# Patient Record
Sex: Female | Born: 1955 | Race: White | Hispanic: No | Marital: Single | Smoking: Former smoker
Health system: Southern US, Community
[De-identification: ages and names within clinical notes are randomized; demographics above are authoritative.]

## PROBLEM LIST (undated history)

## (undated) DIAGNOSIS — F329 Major depressive disorder, single episode, unspecified: Secondary | ICD-10-CM

## (undated) DIAGNOSIS — T7840XA Allergy, unspecified, initial encounter: Secondary | ICD-10-CM

## (undated) DIAGNOSIS — R3129 Other microscopic hematuria: Secondary | ICD-10-CM

## (undated) DIAGNOSIS — N951 Menopausal and female climacteric states: Secondary | ICD-10-CM

## (undated) DIAGNOSIS — Z8669 Personal history of other diseases of the nervous system and sense organs: Secondary | ICD-10-CM

## (undated) DIAGNOSIS — K5732 Diverticulitis of large intestine without perforation or abscess without bleeding: Secondary | ICD-10-CM

## (undated) DIAGNOSIS — M1990 Unspecified osteoarthritis, unspecified site: Secondary | ICD-10-CM

## (undated) DIAGNOSIS — G43909 Migraine, unspecified, not intractable, without status migrainosus: Secondary | ICD-10-CM

## (undated) DIAGNOSIS — I10 Essential (primary) hypertension: Secondary | ICD-10-CM

## (undated) DIAGNOSIS — R109 Unspecified abdominal pain: Secondary | ICD-10-CM

## (undated) DIAGNOSIS — R112 Nausea with vomiting, unspecified: Secondary | ICD-10-CM

## (undated) DIAGNOSIS — N63 Unspecified lump in unspecified breast: Secondary | ICD-10-CM

## (undated) DIAGNOSIS — F411 Generalized anxiety disorder: Secondary | ICD-10-CM

## (undated) DIAGNOSIS — N760 Acute vaginitis: Secondary | ICD-10-CM

## (undated) DIAGNOSIS — Z9889 Other specified postprocedural states: Secondary | ICD-10-CM

## (undated) DIAGNOSIS — E785 Hyperlipidemia, unspecified: Secondary | ICD-10-CM

## (undated) DIAGNOSIS — N329 Bladder disorder, unspecified: Secondary | ICD-10-CM

## (undated) HISTORY — DX: Other microscopic hematuria: R31.29

## (undated) HISTORY — PX: OOPHORECTOMY: SHX86

## (undated) HISTORY — DX: Unspecified osteoarthritis, unspecified site: M19.90

## (undated) HISTORY — PX: COLONOSCOPY: SHX174

## (undated) HISTORY — DX: Generalized anxiety disorder: F41.1

## (undated) HISTORY — DX: Migraine, unspecified, not intractable, without status migrainosus: G43.909

## (undated) HISTORY — DX: Acute vaginitis: N76.0

## (undated) HISTORY — PX: ABDOMINAL HYSTERECTOMY: SHX81

## (undated) HISTORY — PX: TONSILLECTOMY: SUR1361

## (undated) HISTORY — PX: EXPLORATORY LAPAROTOMY: SUR591

## (undated) HISTORY — DX: Essential (primary) hypertension: I10

## (undated) HISTORY — DX: Hyperlipidemia, unspecified: E78.5

## (undated) HISTORY — PX: POLYPECTOMY: SHX149

## (undated) HISTORY — PX: DILATION AND CURETTAGE OF UTERUS: SHX78

## (undated) HISTORY — DX: Unspecified lump in unspecified breast: N63.0

## (undated) HISTORY — DX: Unspecified abdominal pain: R10.9

## (undated) HISTORY — DX: Diverticulitis of large intestine without perforation or abscess without bleeding: K57.32

## (undated) HISTORY — DX: Major depressive disorder, single episode, unspecified: F32.9

## (undated) HISTORY — DX: Menopausal and female climacteric states: N95.1

## (undated) HISTORY — DX: Allergy, unspecified, initial encounter: T78.40XA

---

## 1988-10-01 HISTORY — PX: ABDOMINAL HYSTERECTOMY: SHX81

## 1988-10-01 HISTORY — PX: OOPHORECTOMY: SHX86

## 2004-11-14 ENCOUNTER — Ambulatory Visit: Payer: Self-pay | Admitting: Internal Medicine

## 2004-11-22 ENCOUNTER — Ambulatory Visit: Payer: Self-pay | Admitting: Internal Medicine

## 2005-11-22 ENCOUNTER — Ambulatory Visit: Payer: Self-pay | Admitting: Internal Medicine

## 2005-11-26 ENCOUNTER — Ambulatory Visit: Payer: Self-pay | Admitting: Internal Medicine

## 2005-12-03 ENCOUNTER — Ambulatory Visit: Payer: Self-pay | Admitting: Internal Medicine

## 2006-01-08 ENCOUNTER — Ambulatory Visit: Payer: Self-pay | Admitting: Internal Medicine

## 2006-12-19 ENCOUNTER — Ambulatory Visit: Payer: Self-pay | Admitting: Internal Medicine

## 2006-12-19 LAB — CONVERTED CEMR LAB
ALT: 32 units/L (ref 0–40)
AST: 30 units/L (ref 0–37)
Albumin: 3.8 g/dL (ref 3.5–5.2)
Alkaline Phosphatase: 49 units/L (ref 39–117)
BUN: 11 mg/dL (ref 6–23)
Basophils Absolute: 0 10*3/uL (ref 0.0–0.1)
Basophils Relative: 0.6 % (ref 0.0–1.0)
Bilirubin Urine: NEGATIVE
Bilirubin, Direct: 0.1 mg/dL (ref 0.0–0.3)
CO2: 29 meq/L (ref 19–32)
Calcium: 9 mg/dL (ref 8.4–10.5)
Chloride: 104 meq/L (ref 96–112)
Cholesterol: 169 mg/dL (ref 0–200)
Creatinine, Ser: 0.6 mg/dL (ref 0.4–1.2)
Crystals: NEGATIVE
Eosinophils Absolute: 0.3 10*3/uL (ref 0.0–0.6)
Eosinophils Relative: 5.1 % — ABNORMAL HIGH (ref 0.0–5.0)
GFR calc Af Amer: 136 mL/min
GFR calc non Af Amer: 112 mL/min
Glucose, Bld: 110 mg/dL — ABNORMAL HIGH (ref 70–99)
HCT: 34.6 % — ABNORMAL LOW (ref 36.0–46.0)
HDL: 61.4 mg/dL (ref >39.0)
Hemoglobin: 12.1 g/dL (ref 12.0–15.0)
Ketones, ur: NEGATIVE mg/dL
LDL Cholesterol: 84 mg/dL (ref 0–99)
Leukocytes, UA: NEGATIVE
Lymphocytes Relative: 33.5 % (ref 12.0–46.0)
MCHC: 35 g/dL (ref 30.0–36.0)
MCV: 97.5 fL (ref 78.0–100.0)
Monocytes Absolute: 0.6 10*3/uL (ref 0.2–0.7)
Monocytes Relative: 8.5 % (ref 3.0–11.0)
Mucus, UA: NEGATIVE
Neutro Abs: 3.5 10*3/uL (ref 1.4–7.7)
Neutrophils Relative %: 52.3 % (ref 43.0–77.0)
Nitrite: NEGATIVE
Platelets: 283 10*3/uL (ref 150–400)
Potassium: 4.2 meq/L (ref 3.5–5.1)
RBC: 3.55 M/uL — ABNORMAL LOW (ref 3.87–5.11)
RDW: 11.6 % (ref 11.5–14.6)
Sodium: 139 meq/L (ref 135–145)
Specific Gravity, Urine: 1.01 (ref 1.000–1.03)
TSH: 0.92 microintl units/mL (ref 0.35–5.50)
Total Bilirubin: 0.6 mg/dL (ref 0.3–1.2)
Total CHOL/HDL Ratio: 2.8
Total Protein, Urine: NEGATIVE mg/dL
Total Protein: 6.8 g/dL (ref 6.0–8.3)
Triglycerides: 116 mg/dL (ref 0–149)
Urine Glucose: NEGATIVE mg/dL
Urobilinogen, UA: 0.2 (ref 0.0–1.0)
VLDL: 23 mg/dL (ref 0–40)
WBC: 6.6 10*3/uL (ref 4.5–10.5)
pH: 7.5 (ref 5.0–8.0)

## 2006-12-23 ENCOUNTER — Ambulatory Visit: Payer: Self-pay | Admitting: Internal Medicine

## 2007-05-31 ENCOUNTER — Encounter: Payer: Self-pay | Admitting: Internal Medicine

## 2007-05-31 DIAGNOSIS — I10 Essential (primary) hypertension: Secondary | ICD-10-CM | POA: Insufficient documentation

## 2007-05-31 DIAGNOSIS — E785 Hyperlipidemia, unspecified: Secondary | ICD-10-CM

## 2007-05-31 DIAGNOSIS — G43909 Migraine, unspecified, not intractable, without status migrainosus: Secondary | ICD-10-CM | POA: Insufficient documentation

## 2007-05-31 HISTORY — DX: Essential (primary) hypertension: I10

## 2007-05-31 HISTORY — DX: Hyperlipidemia, unspecified: E78.5

## 2007-12-18 ENCOUNTER — Ambulatory Visit: Payer: Self-pay | Admitting: Internal Medicine

## 2007-12-18 LAB — CONVERTED CEMR LAB
ALT: 45 units/L — ABNORMAL HIGH (ref 0–35)
AST: 34 units/L (ref 0–37)
Albumin: 4.2 g/dL (ref 3.5–5.2)
Alkaline Phosphatase: 52 units/L (ref 39–117)
BUN: 16 mg/dL (ref 6–23)
Basophils Absolute: 0 10*3/uL (ref 0.0–0.1)
Basophils Relative: 0.5 % (ref 0.0–1.0)
Bilirubin Urine: NEGATIVE
Bilirubin, Direct: 0.2 mg/dL (ref 0.0–0.3)
CO2: 31 meq/L (ref 19–32)
Calcium: 9.7 mg/dL (ref 8.4–10.5)
Chloride: 101 meq/L (ref 96–112)
Cholesterol: 181 mg/dL (ref 0–200)
Creatinine, Ser: 0.7 mg/dL (ref 0.4–1.2)
Crystals: NEGATIVE
Eosinophils Absolute: 0.4 10*3/uL (ref 0.0–0.6)
Eosinophils Relative: 5.7 % — ABNORMAL HIGH (ref 0.0–5.0)
GFR calc Af Amer: 113 mL/min
GFR calc non Af Amer: 94 mL/min
Glucose, Bld: 89 mg/dL (ref 70–99)
HCT: 43.3 % (ref 36.0–46.0)
HDL: 56 mg/dL (ref >39.0)
Hemoglobin: 14.3 g/dL (ref 12.0–15.0)
Ketones, ur: 15 mg/dL — AB
LDL Cholesterol: 91 mg/dL (ref 0–99)
Leukocytes, UA: NEGATIVE
Lymphocytes Relative: 38.7 % (ref 12.0–46.0)
MCHC: 33.1 g/dL (ref 30.0–36.0)
MCV: 98.8 fL (ref 78.0–100.0)
Monocytes Absolute: 0.7 10*3/uL (ref 0.2–0.7)
Monocytes Relative: 10.8 % (ref 3.0–11.0)
Mucus, UA: NEGATIVE
Neutro Abs: 3.1 10*3/uL (ref 1.4–7.7)
Neutrophils Relative %: 44.3 % (ref 43.0–77.0)
Nitrite: NEGATIVE
Platelets: 340 10*3/uL (ref 150–400)
Potassium: 4.5 meq/L (ref 3.5–5.1)
RBC: 4.38 M/uL (ref 3.87–5.11)
RDW: 12.6 % (ref 11.5–14.6)
Sodium: 140 meq/L (ref 135–145)
Specific Gravity, Urine: 1.015 (ref 1.000–1.03)
TSH: 0.87 microintl units/mL (ref 0.35–5.50)
Total Bilirubin: 0.8 mg/dL (ref 0.3–1.2)
Total CHOL/HDL Ratio: 3.2
Total Protein, Urine: NEGATIVE mg/dL
Total Protein: 7 g/dL (ref 6.0–8.3)
Triglycerides: 169 mg/dL — ABNORMAL HIGH (ref 0–149)
Urine Glucose: NEGATIVE mg/dL
Urobilinogen, UA: 0.2 (ref 0.0–1.0)
VLDL: 34 mg/dL (ref 0–40)
WBC: 6.9 10*3/uL (ref 4.5–10.5)
pH: 6.5 (ref 5.0–8.0)

## 2007-12-24 ENCOUNTER — Ambulatory Visit: Payer: Self-pay | Admitting: Internal Medicine

## 2007-12-24 DIAGNOSIS — F411 Generalized anxiety disorder: Secondary | ICD-10-CM | POA: Insufficient documentation

## 2007-12-24 DIAGNOSIS — F32A Depression, unspecified: Secondary | ICD-10-CM | POA: Insufficient documentation

## 2007-12-24 DIAGNOSIS — F329 Major depressive disorder, single episode, unspecified: Secondary | ICD-10-CM

## 2007-12-24 DIAGNOSIS — N760 Acute vaginitis: Secondary | ICD-10-CM | POA: Insufficient documentation

## 2007-12-24 DIAGNOSIS — F3289 Other specified depressive episodes: Secondary | ICD-10-CM

## 2007-12-24 HISTORY — DX: Generalized anxiety disorder: F41.1

## 2007-12-24 HISTORY — DX: Other specified depressive episodes: F32.89

## 2007-12-24 HISTORY — DX: Major depressive disorder, single episode, unspecified: F32.9

## 2007-12-24 HISTORY — DX: Acute vaginitis: N76.0

## 2007-12-31 ENCOUNTER — Ambulatory Visit: Payer: Self-pay | Admitting: Internal Medicine

## 2007-12-31 ENCOUNTER — Encounter: Payer: Self-pay | Admitting: Internal Medicine

## 2008-01-08 ENCOUNTER — Ambulatory Visit: Payer: Self-pay | Admitting: Internal Medicine

## 2008-01-08 LAB — CONVERTED CEMR LAB
Bilirubin Urine: NEGATIVE
Crystals: NEGATIVE
Ketones, ur: NEGATIVE mg/dL
Leukocytes, UA: NEGATIVE
Mucus, UA: NEGATIVE
Nitrite: NEGATIVE
Specific Gravity, Urine: 1.01 (ref 1.000–1.03)
Total Protein, Urine: NEGATIVE mg/dL
Urine Glucose: NEGATIVE mg/dL
Urobilinogen, UA: 0.2 (ref 0.0–1.0)
pH: 7.5 (ref 5.0–8.0)

## 2008-01-13 ENCOUNTER — Ambulatory Visit: Payer: Self-pay | Admitting: Gastroenterology

## 2008-01-22 ENCOUNTER — Encounter: Payer: Self-pay | Admitting: Internal Medicine

## 2008-01-22 ENCOUNTER — Telehealth (INDEPENDENT_AMBULATORY_CARE_PROVIDER_SITE_OTHER): Payer: Self-pay | Admitting: *Deleted

## 2008-02-16 ENCOUNTER — Encounter: Payer: Self-pay | Admitting: Internal Medicine

## 2008-02-17 ENCOUNTER — Telehealth (INDEPENDENT_AMBULATORY_CARE_PROVIDER_SITE_OTHER): Payer: Self-pay | Admitting: *Deleted

## 2008-02-20 ENCOUNTER — Encounter: Payer: Self-pay | Admitting: Internal Medicine

## 2008-03-31 LAB — HM COLONOSCOPY

## 2008-05-05 ENCOUNTER — Telehealth (INDEPENDENT_AMBULATORY_CARE_PROVIDER_SITE_OTHER): Payer: Self-pay | Admitting: *Deleted

## 2008-05-10 ENCOUNTER — Encounter: Payer: Self-pay | Admitting: Internal Medicine

## 2008-07-15 ENCOUNTER — Telehealth (INDEPENDENT_AMBULATORY_CARE_PROVIDER_SITE_OTHER): Payer: Self-pay | Admitting: *Deleted

## 2008-07-15 DIAGNOSIS — N63 Unspecified lump in unspecified breast: Secondary | ICD-10-CM | POA: Insufficient documentation

## 2008-07-15 HISTORY — DX: Unspecified lump in unspecified breast: N63.0

## 2008-08-04 ENCOUNTER — Encounter: Payer: Self-pay | Admitting: Internal Medicine

## 2008-12-29 ENCOUNTER — Ambulatory Visit: Payer: Self-pay | Admitting: Internal Medicine

## 2008-12-29 LAB — CONVERTED CEMR LAB
ALT: 31 units/L (ref 0–35)
AST: 29 units/L (ref 0–37)
Albumin: 4.1 g/dL (ref 3.5–5.2)
Alkaline Phosphatase: 50 units/L (ref 39–117)
BUN: 16 mg/dL (ref 6–23)
Basophils Absolute: 0 10*3/uL (ref 0.0–0.1)
Basophils Relative: 0.6 % (ref 0.0–3.0)
Bilirubin Urine: NEGATIVE
Bilirubin, Direct: 0.1 mg/dL (ref 0.0–0.3)
CO2: 30 meq/L (ref 19–32)
Calcium: 9.5 mg/dL (ref 8.4–10.5)
Chloride: 106 meq/L (ref 96–112)
Cholesterol: 168 mg/dL (ref 0–200)
Creatinine, Ser: 0.6 mg/dL (ref 0.4–1.2)
Eosinophils Absolute: 0.4 10*3/uL (ref 0.0–0.7)
Eosinophils Relative: 5 % (ref 0.0–5.0)
GFR calc non Af Amer: 111.38 mL/min (ref >60)
Glucose, Bld: 101 mg/dL — ABNORMAL HIGH (ref 70–99)
HCT: 41.8 % (ref 36.0–46.0)
HDL: 60.7 mg/dL (ref >39.00)
Hemoglobin: 14.5 g/dL (ref 12.0–15.0)
LDL Cholesterol: 80 mg/dL (ref 0–99)
Leukocytes, UA: NEGATIVE
Lymphocytes Relative: 36.2 % (ref 12.0–46.0)
Lymphs Abs: 2.6 10*3/uL (ref 0.7–4.0)
MCHC: 34.7 g/dL (ref 30.0–36.0)
MCV: 98.2 fL (ref 78.0–100.0)
Monocytes Absolute: 0.6 10*3/uL (ref 0.1–1.0)
Monocytes Relative: 8.7 % (ref 3.0–12.0)
Neutro Abs: 3.5 10*3/uL (ref 1.4–7.7)
Neutrophils Relative %: 49.5 % (ref 43.0–77.0)
Nitrite: NEGATIVE
Platelets: 297 10*3/uL (ref 150.0–400.0)
Potassium: 4.5 meq/L (ref 3.5–5.1)
RBC: 4.26 M/uL (ref 3.87–5.11)
RDW: 11.9 % (ref 11.5–14.6)
Sodium: 143 meq/L (ref 135–145)
Specific Gravity, Urine: 1.02 (ref 1.000–1.030)
TSH: 0.68 microintl units/mL (ref 0.35–5.50)
Total Bilirubin: 0.7 mg/dL (ref 0.3–1.2)
Total CHOL/HDL Ratio: 3
Total Protein, Urine: NEGATIVE mg/dL
Total Protein: 7.3 g/dL (ref 6.0–8.3)
Triglycerides: 137 mg/dL (ref 0.0–149.0)
Urine Glucose: NEGATIVE mg/dL
Urobilinogen, UA: 0.2 (ref 0.0–1.0)
VLDL: 27.4 mg/dL (ref 0.0–40.0)
WBC: 7.1 10*3/uL (ref 4.5–10.5)
pH: 6 (ref 5.0–8.0)

## 2009-01-03 ENCOUNTER — Ambulatory Visit: Payer: Self-pay | Admitting: Internal Medicine

## 2009-01-03 DIAGNOSIS — R3129 Other microscopic hematuria: Secondary | ICD-10-CM

## 2009-01-03 HISTORY — DX: Other microscopic hematuria: R31.29

## 2009-01-03 LAB — CONVERTED CEMR LAB
Bilirubin Urine: NEGATIVE
Ketones, ur: NEGATIVE mg/dL
Leukocytes, UA: NEGATIVE
Nitrite: NEGATIVE
Specific Gravity, Urine: 1.015 (ref 1.000–1.030)
Total Protein, Urine: NEGATIVE mg/dL
Urine Glucose: NEGATIVE mg/dL
Urobilinogen, UA: 0.2 (ref 0.0–1.0)
pH: 6.5 (ref 5.0–8.0)

## 2009-01-07 ENCOUNTER — Encounter: Payer: Self-pay | Admitting: Internal Medicine

## 2009-01-07 ENCOUNTER — Ambulatory Visit: Payer: Self-pay | Admitting: Cardiology

## 2009-01-20 ENCOUNTER — Encounter: Payer: Self-pay | Admitting: Internal Medicine

## 2009-02-14 ENCOUNTER — Encounter: Payer: Self-pay | Admitting: Internal Medicine

## 2009-03-01 ENCOUNTER — Telehealth (INDEPENDENT_AMBULATORY_CARE_PROVIDER_SITE_OTHER): Payer: Self-pay | Admitting: *Deleted

## 2009-06-09 ENCOUNTER — Telehealth: Payer: Self-pay | Admitting: Internal Medicine

## 2009-07-01 LAB — HM MAMMOGRAPHY: HM Mammogram: NORMAL

## 2009-12-29 ENCOUNTER — Ambulatory Visit: Payer: Self-pay | Admitting: Internal Medicine

## 2009-12-29 LAB — CONVERTED CEMR LAB
ALT: 28 units/L (ref 0–35)
AST: 26 units/L (ref 0–37)
Albumin: 4.2 g/dL (ref 3.5–5.2)
Alkaline Phosphatase: 52 units/L (ref 39–117)
BUN: 12 mg/dL (ref 6–23)
Basophils Absolute: 0 10*3/uL (ref 0.0–0.1)
Basophils Relative: 0.5 % (ref 0.0–3.0)
Bilirubin Urine: NEGATIVE
Bilirubin, Direct: 0.1 mg/dL (ref 0.0–0.3)
CO2: 31 meq/L (ref 19–32)
Calcium: 9.6 mg/dL (ref 8.4–10.5)
Chloride: 102 meq/L (ref 96–112)
Cholesterol: 171 mg/dL (ref 0–200)
Creatinine, Ser: 0.7 mg/dL (ref 0.4–1.2)
Eosinophils Absolute: 0.4 10*3/uL (ref 0.0–0.7)
Eosinophils Relative: 6.3 % — ABNORMAL HIGH (ref 0.0–5.0)
GFR calc non Af Amer: 92.87 mL/min (ref >60)
Glucose, Bld: 100 mg/dL — ABNORMAL HIGH (ref 70–99)
HCT: 41 % (ref 36.0–46.0)
HDL: 69.3 mg/dL (ref >39.00)
Hemoglobin: 13.8 g/dL (ref 12.0–15.0)
Ketones, ur: NEGATIVE mg/dL
LDL Cholesterol: 79 mg/dL (ref 0–99)
Leukocytes, UA: NEGATIVE
Lymphocytes Relative: 36.8 % (ref 12.0–46.0)
Lymphs Abs: 2.5 10*3/uL (ref 0.7–4.0)
MCHC: 33.7 g/dL (ref 30.0–36.0)
MCV: 100.5 fL — ABNORMAL HIGH (ref 78.0–100.0)
Monocytes Absolute: 0.6 10*3/uL (ref 0.1–1.0)
Monocytes Relative: 9.2 % (ref 3.0–12.0)
Neutro Abs: 3.2 10*3/uL (ref 1.4–7.7)
Neutrophils Relative %: 47.2 % (ref 43.0–77.0)
Nitrite: NEGATIVE
Platelets: 318 10*3/uL (ref 150.0–400.0)
Potassium: 3.9 meq/L (ref 3.5–5.1)
RBC: 4.08 M/uL (ref 3.87–5.11)
RDW: 12.2 % (ref 11.5–14.6)
Sodium: 144 meq/L (ref 135–145)
Specific Gravity, Urine: >=1.03 (ref 1.000–1.030)
TSH: 0.86 microintl units/mL (ref 0.35–5.50)
Total Bilirubin: 0.4 mg/dL (ref 0.3–1.2)
Total CHOL/HDL Ratio: 2
Total Protein, Urine: NEGATIVE mg/dL
Total Protein: 7.3 g/dL (ref 6.0–8.3)
Triglycerides: 115 mg/dL (ref 0.0–149.0)
Urine Glucose: NEGATIVE mg/dL
Urobilinogen, UA: 0.2 (ref 0.0–1.0)
VLDL: 23 mg/dL (ref 0.0–40.0)
WBC: 6.7 10*3/uL (ref 4.5–10.5)
pH: 5.5 (ref 5.0–8.0)

## 2010-01-04 ENCOUNTER — Ambulatory Visit: Payer: Self-pay | Admitting: Internal Medicine

## 2010-01-04 DIAGNOSIS — N951 Menopausal and female climacteric states: Secondary | ICD-10-CM | POA: Insufficient documentation

## 2010-01-04 HISTORY — DX: Menopausal and female climacteric states: N95.1

## 2010-01-09 ENCOUNTER — Encounter: Payer: Self-pay | Admitting: Internal Medicine

## 2010-01-09 ENCOUNTER — Ambulatory Visit: Payer: Self-pay | Admitting: Family Medicine

## 2010-02-22 ENCOUNTER — Encounter: Payer: Self-pay | Admitting: Internal Medicine

## 2010-04-21 ENCOUNTER — Ambulatory Visit: Payer: Self-pay | Admitting: Internal Medicine

## 2010-04-21 DIAGNOSIS — K5732 Diverticulitis of large intestine without perforation or abscess without bleeding: Secondary | ICD-10-CM | POA: Insufficient documentation

## 2010-04-21 HISTORY — DX: Diverticulitis of large intestine without perforation or abscess without bleeding: K57.32

## 2010-05-22 ENCOUNTER — Ambulatory Visit: Payer: Self-pay | Admitting: Internal Medicine

## 2010-05-22 DIAGNOSIS — R109 Unspecified abdominal pain: Secondary | ICD-10-CM | POA: Insufficient documentation

## 2010-05-22 HISTORY — DX: Unspecified abdominal pain: R10.9

## 2010-05-22 LAB — CONVERTED CEMR LAB
BUN: 16 mg/dL (ref 6–23)
Basophils Absolute: 0.1 10*3/uL (ref 0.0–0.1)
Basophils Relative: 0.9 % (ref 0.0–3.0)
Bilirubin Urine: NEGATIVE
CO2: 28 meq/L (ref 19–32)
Calcium: 10.3 mg/dL (ref 8.4–10.5)
Chloride: 102 meq/L (ref 96–112)
Creatinine, Ser: 0.6 mg/dL (ref 0.4–1.2)
Eosinophils Absolute: 0.4 10*3/uL (ref 0.0–0.7)
Eosinophils Relative: 3.9 % (ref 0.0–5.0)
GFR calc non Af Amer: 112.96 mL/min (ref >60)
Glucose, Bld: 89 mg/dL (ref 70–99)
HCT: 41.4 % (ref 36.0–46.0)
Hemoglobin: 14.1 g/dL (ref 12.0–15.0)
Ketones, ur: NEGATIVE mg/dL
Leukocytes, UA: NEGATIVE
Lipase: 28 units/L (ref 11.0–59.0)
Lymphocytes Relative: 36.4 % (ref 12.0–46.0)
Lymphs Abs: 3.6 10*3/uL (ref 0.7–4.0)
MCHC: 34.1 g/dL (ref 30.0–36.0)
MCV: 99.3 fL (ref 78.0–100.0)
Monocytes Absolute: 0.8 10*3/uL (ref 0.1–1.0)
Monocytes Relative: 8.3 % (ref 3.0–12.0)
Neutro Abs: 5 10*3/uL (ref 1.4–7.7)
Neutrophils Relative %: 50.5 % (ref 43.0–77.0)
Nitrite: NEGATIVE
Platelets: 207 10*3/uL (ref 150.0–400.0)
Potassium: 4.5 meq/L (ref 3.5–5.1)
RBC: 4.16 M/uL (ref 3.87–5.11)
RDW: 13.3 % (ref 11.5–14.6)
Sodium: 141 meq/L (ref 135–145)
Specific Gravity, Urine: 1.02 (ref 1.000–1.030)
Total Protein, Urine: NEGATIVE mg/dL
Urine Glucose: NEGATIVE mg/dL
Urobilinogen, UA: 0.2 (ref 0.0–1.0)
WBC: 9.9 10*3/uL (ref 4.5–10.5)
pH: 8 (ref 5.0–8.0)

## 2010-05-23 ENCOUNTER — Encounter: Payer: Self-pay | Admitting: Internal Medicine

## 2010-05-24 ENCOUNTER — Encounter: Payer: Self-pay | Admitting: Internal Medicine

## 2010-05-29 ENCOUNTER — Telehealth: Payer: Self-pay | Admitting: Internal Medicine

## 2010-05-30 ENCOUNTER — Encounter: Payer: Self-pay | Admitting: Internal Medicine

## 2010-10-22 ENCOUNTER — Encounter: Payer: Self-pay | Admitting: Internal Medicine

## 2010-10-31 NOTE — Assessment & Plan Note (Signed)
Summary: ACUTE ABD PAIN/ FEVER/ DR Jonny Ruiz DIDN'T HAVE ANYTHING IN AM/NWS   Vital Signs:  Patient profile:   55 year old female Height:      65 inches (165.10 cm) Weight:      147.25 pounds (66.93 kg) O2 Sat:      97 % on Room air Temp:     99.6 degrees F (37.56 degrees C) oral Pulse rate:   89 / minute BP sitting:   120 / 92  (left arm) Cuff size:   regular  Vitals Entered By: Orlan Leavens (April 21, 2010 9:11 AM)  O2 Flow:  Room air CC: Abdominal pain & fever x's 3 days Is Patient Diabetic? No Pain Assessment Patient in pain? yes     Location: abdominal pain Type: aching   Primary Care Provider:  Corwin Levins MD  CC:  Abdominal pain & fever x's 3 days.  History of Present Illness:  Abdominal Pain      This is a 55 year old woman who presents with Abdominal pain.  The symptoms began 3 days ago.  On a scale of 1 to 10, the intensity is described as a 6.  +hx diverticulitis in past, similar to current pain -.  The patient reports constipation, but denies nausea, vomiting, diarrhea, melena, hematochezia, anorexia, and hematemesis.  The location of the pain is left lower quadrant.  The pain is described as constant, sharp, and dull.  Associated symptoms include fever.  The patient denies the following symptoms: weight loss, dysuria, chest pain, and vaginal bleeding.  The pain is worse with jarring of the abdomen.  The pain is better with defecation.    Clinical Review Panels:  Prevention   Last Mammogram:  normal (07/01/2009)   Last Colonoscopy:  Hyperplastic Polyp (03/31/2008)  CBC   WBC:  6.7 (12/29/2009)   RBC:  4.08 (12/29/2009)   Hgb:  13.8 (12/29/2009)   Hct:  41.0 (12/29/2009)   Platelets:  318.0 (12/29/2009)   MCV  100.5 (12/29/2009)   MCHC  33.7 (12/29/2009)   RDW  12.2 (12/29/2009)   PMN:  47.2 (12/29/2009)   Lymphs:  36.8 (12/29/2009)   Monos:  9.2 (12/29/2009)   Eosinophils:  6.3 (12/29/2009)   Basophil:  0.5 (12/29/2009)  Complete Metabolic Panel  Glucose:  100 (12/29/2009)   Sodium:  144 (12/29/2009)   Potassium:  3.9 (12/29/2009)   Chloride:  102 (12/29/2009)   CO2:  31 (12/29/2009)   BUN:  12 (12/29/2009)   Creatinine:  0.7 (12/29/2009)   Albumin:  4.2 (12/29/2009)   Total Protein:  7.3 (12/29/2009)   Calcium:  9.6 (12/29/2009)   Total Bili:  0.4 (12/29/2009)   Alk Phos:  52 (12/29/2009)   SGPT (ALT):  28 (12/29/2009)   SGOT (AST):  26 (12/29/2009)   Current Medications (verified): 1)  Mevacor 20 Mg  Tabs (Lovastatin) .Marland Kitchen.. 1 By Mouth Once Daily 2)  Benazepril-Hydrochlorothiazide 20-12.5 Mg Tabs (Benazepril-Hydrochlorothiazide) .... 2 By Mouth Once Daily 3)  Adult Aspirin Ec Low Strength 81 Mg Tbec (Aspirin) .Marland Kitchen.. 1po Once Daily  Allergies (verified): 1)  ! Effexor  Past History:  Past Medical History: Hypertension Hyperlipidemia Migraine Headache Depression Anxiety raynaud's   Past Surgical History: Hysterectomy  Oophorectomy  Review of Systems  The patient denies weight loss, chest pain, headaches, melena, and severe indigestion/heartburn.    Physical Exam  General:  alert, well-developed, well-nourished, and cooperative to examination.   nontox but flushed Lungs:  normal respiratory effort, no intercostal retractions  or use of accessory muscles; normal breath sounds bilaterally - no crackles and no wheezes.    Heart:  normal rate, regular rhythm, no murmur, and no rub. BLE without edema. Abdomen:  LLQ tenderness to palp but no r/g - soft, normal bowel sounds, no distention; no masses and no appreciable hepatomegaly or splenomegaly.     Impression & Recommendations:  Problem # 1:  DIVERTICULITIS OF COLON (ICD-562.11)  hx same -  LGF and typical hx/exam findings now - tx cipro+flagyl - hydrate instructed to call if worse during or unimproved after course of abx tx  Colonoscopy: 2009 -  Labs Reviewed: Hgb: 13.8 (12/29/2009)   Hct: 41.0 (12/29/2009)   WBC: 6.7 (12/29/2009)  Orders: Prescription  Created Electronically [phone])  Complete Medication List: 1)  Mevacor 20 Mg Tabs (Lovastatin) .Marland Kitchen.. 1 by mouth once daily 2)  Benazepril-hydrochlorothiazide 20-12.5 Mg Tabs (Benazepril-hydrochlorothiazide) .... 2 by mouth once daily 3)  Adult Aspirin Ec Low Strength 81 Mg Tbec (Aspirin) .Marland Kitchen.. 1po once daily 4)  Ciprofloxacin Hcl 500 Mg Tabs (Ciprofloxacin hcl) .Marland Kitchen.. 1 by mouth two times a day x 7 days 5)  Flagyl 500 Mg Tabs (Metronidazole) .Marland Kitchen.. 1 by mouth three times a day x 7days  Patient Instructions: 1)  it was good to see you today. 2)  Cipro + Flagyl antibiotics as disussed for suspected diverticulitis - your prescriptions have been electronically submitted to your pharmacy. Please take as directed. Contact our office if you believe you're having problems with the medication(s).  3)  Get plenty of rest, drink lots of clear liquids, and use Tylenol and/or Ibuprofen for fever and comfort. Return in 7-10 days if you're not better:sooner if you're feeling worse. Prescriptions: FLAGYL 500 MG TABS (METRONIDAZOLE) 1 by mouth three times a day x 7days  #21 x 0   Entered and Authorized by:   Newt Lukes MD   Signed by:   Newt Lukes MD on 04/21/2010   Method used:   Electronically to        [facility]  [phone]* (retail)       [address]       [place], [place]  [address]       Ph: [phone] or [phone]       Fax: [phone]   RxID:   [id number] CIPROFLOXACIN HCL 500 MG TABS (CIPROFLOXACIN HCL) 1 by mouth two times a day x 7 days  #14 x 0   Entered and Authorized by:   Newt Lukes MD   Signed by:   Newt Lukes MD on 04/21/2010   Method used:   Electronically to        [facility]  [phone]* (retail)       [address]       [place], [place]  [address]       Ph: [phone] or [phone]       Fax: [phone]   RxID:   [id number]

## 2010-10-31 NOTE — Progress Notes (Signed)
Summary: refill  Phone Note Refill Request Message from:  Fax from Pharmacy on June 09, 2009 1:23 PM  Refills Requested: Medication #1:  BENAZEPRIL-HYDROCHLOROTHIAZIDE 20-12.5 MG TABS 2 by mouth once daily   Dosage confirmed as above?Dosage Confirmed Initial call taken by: Windell Norfolk,  June 09, 2009 1:23 PM    Prescriptions: BENAZEPRIL-HYDROCHLOROTHIAZIDE 20-12.5 MG TABS (BENAZEPRIL-HYDROCHLOROTHIAZIDE) 2 by mouth once daily  #60 x 5   Entered by:   Windell Norfolk   Authorized by:   Corwin Levins MD   Signed by:   Windell Norfolk on 06/09/2009   Method used:   Electronically to        [facility]  [phone]* (retail)       [address]       [place], [place]  [address]       Ph: [phone] or [phone]       Fax: [phone]   RxID:   [id number]

## 2010-10-31 NOTE — Letter (Signed)
[facility]: [facility]   Imported By: Lester Sabine 01/28/2009 07:24:08  _____________________________________________________________________  External Attachment:    Type:   Image     Comment:   External Document

## 2010-10-31 NOTE — Assessment & Plan Note (Signed)
Summary: CPX/AWARE OF FEE/NML   Vital Signs:  Patient Profile:   55 Years Old Female Weight:      151 pounds Temp:     98.2 degrees F oral Pulse rate:   87 / minute BP sitting:   123 / 84  (right arm) Cuff size:   regular  Pt. in pain?   no  Vitals Entered By: Maris Berger (December 24, 2007 8:42 AM)                  Chief Complaint:  cpz.  History of Present Illness: lost 8 lbs intentionally since last year, overall o/w doing well    Updated Prior Medication List: ESTRADIOL 1 MG  TABS (ESTRADIOL) 1/2 tab qd MEVACOR 20 MG  TABS (LOVASTATIN) 1 by mouth qd DIOVAN HCT 320-25 MG  TABS (VALSARTAN-HYDROCHLOROTHIAZIDE) 1 by mouth qd ASPIRIN   POWD (ASPIRIN) 81 mg 1 by mouth qd METRONIDAZOLE 250 MG  TABS (METRONIDAZOLE) 1 by mouth qid  Current Allergies (reviewed today): ! [facility]  Past Medical History:    Reviewed history from 05/31/2007 and no changes required:       Hypertension       Hyperlipidemia       Migraine Headache       Depression       Anxiety       raynaud's  Past Surgical History:    Reviewed history from 05/31/2007 and no changes required:       Hysterectomy       Oophorectomy   Family History:    Reviewed history and no changes required:       mother had bleedin ulcer, HTN       father with HTN, DM       grandfather with melanoma       grandmother with colon cancer       uncle iwth prostate cancer       grandmother with stroke  Social History:    Reviewed history and no changes required:       Divorced       no children       work - Engineer, 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 No apparent alopecia or  balding. Eyes:     No corneal or conjunctival inflammation noted. EOMI. Perrla. Ears:     External ear exam shows no significant lesions or deformities.  Otoscopic examination reveals clear canals, tympanic membranes are intact bilaterally without bulging, retraction, inflammation or discharge. Hearing is grossly normal bilaterally. Nose:     External nasal examination shows no deformity or inflammation. Nasal mucosa are pink and moist without lesions or exudates. Mouth:     Oral mucosa and oropharynx without lesions or exudates.  Teeth in good repair. Neck:     No deformities, masses, or tenderness noted. Lungs:     Normal respiratory effort, chest expands symmetrically. Lungs are clear to auscultation, no crackles or wheezes. Heart:     Normal rate and regular rhythm. S1 and S2 normal without gallop, murmur, click, rub or other extra sounds. Abdomen:     Bowel sounds positive,abdomen soft and non-tender without  masses, organomegaly or hernias noted. Msk:     No deformity or scoliosis noted of thoracic or lumbar spine.   Extremities:     No clubbing, cyanosis, edema, or deformity noted with normal full range of motion of all joints.   Neurologic:     No cranial nerve deficits noted. Station and gait are normal. Plantar reflexes are down-going bilaterally. DTRs are symmetrical throughout. Sensory, motor and coordinative functions appear intact.    Impression & Recommendations:  Problem # 1:  Preventive Health Care (ICD-V70.0) Overall doing well, up to date, counseled on routine health concerns for screening and prevention, immunizations up to date or declined, labs reviewed, ecg reviewed    Orders: T-Bone Densitometry (60[address]) EKG w/ Interpretation (93000) Gastroenterology Referral (GI)   Problem # 2:  BACTERIAL VAGINITIS (ICD-616.10)  Her updated medication list for this problem includes:    Metronidazole 250 Mg Tabs (Metronidazole) .Marland Kitchen... 1 by mouth qid tx as above based on  UA results; likely the source of the "hematuria" but will need repeat UA in 2 wks after tx  Complete Medication List: 1)  Estradiol 1 Mg Tabs (Estradiol) .... 1/2 tab qd 2)  Mevacor 20 Mg Tabs (Lovastatin) .Marland Kitchen.. 1 by mouth qd 3)  Diovan Hct 320-25 Mg Tabs (Valsartan-hydrochlorothiazide) .Marland Kitchen.. 1 by mouth qd 4)  Aspirin Powd (Aspirin) .... 81 mg 1 by mouth qd 5)  Metronidazole 250 Mg Tabs (Metronidazole) .Marland Kitchen.. 1 by mouth qid   Patient Instructions: 1)  you will be contacted about the referral(s) to: colon test 2)  please schedule the bone density prior to leaving today 3)  take all medications as prescribed 4)  continue all medications that you may have been taking previously 5)  please return for Lab Only in 2 wks: 6)  Urine-dip with micro:  599.7 7)  Please schedule a follow-up appointment in 1 year.    Prescriptions: ESTRADIOL 1 MG  TABS (ESTRADIOL) 1/2 tab qd  #45 x 3   Entered and Authorized by:   Corwin Levins MD   Signed by:   Corwin Levins MD on 12/24/2007   Method used:   Print then Give to Patient   RxID:   [id number] ESTRADIOL 1 MG  TABS (ESTRADIOL) 1/2 tab qd  #15 x 11   Entered and Authorized by:   Corwin Levins MD   Signed by:   Corwin Levins MD on 12/24/2007   Method used:   Print then Give to Patient   RxID:   [id number] MEVACOR 20 MG  TABS (LOVASTATIN) 1 by mouth qd  #90 x 3   Entered and Authorized by:   Corwin Levins MD   Signed by:   Corwin Levins MD on 12/24/2007   Method used:   Print then Give to Patient   RxID:   [id number] DIOVAN HCT 320-25 MG  TABS (VALSARTAN-HYDROCHLOROTHIAZIDE) 1 by mouth qd  #90 x 3   Entered and Authorized by:   Corwin Levins MD   Signed by:   Corwin Levins MD on 12/24/2007   Method used:   Print then Give to Patient   RxID:   [id number] METRONIDAZOLE 250 MG  TABS (METRONIDAZOLE) 1 by mouth qid  #40 x 0   Entered and Authorized by:   Corwin Levins MD   Signed by:   Corwin Levins MD on 12/24/2007   Method used:    Print then Give to Patient   RxID:  [id number]  ] 

## 2010-10-31 NOTE — Progress Notes (Signed)
Summary: generic brand  Phone Note Call from Patient Call back at Home Phone [phone]   Caller: Patient/[phone] Call For: Corwin Levins MD Summary of Call: per Rojelio Brenner call pt is currently on  DIOVAN HCT 320-25 MG  TABS (VALSARTAN-HYDROCHLOROTHIAZIDE) 1 by mouth once daily...............Marland Kitchenwant to be switch to generic brand  . less expensive [facility] [address]  [phone]* (retail) [address] [place], [place]  [address] Ph: [phone] or [phone] Fax: [phone] Initial call taken by: Shelbie Proctor,  March 01, 2009 4:43 PM  Follow-up for Phone Call        ok to change to benazepril/HCT 40/25  1 per day with refills per protocol per denae Follow-up by: Corwin Levins MD,  March 01, 2009 5:44 PM  Additional Follow-up for Phone Call Additional follow up Details #1::        I can not find Benazepril 40/25 in the system.... however I do see Benazepril 20/12.5 do you want me to change it to that and do 2 per day Additional Follow-up by: Windell Norfolk,  March 02, 2009 8:31 AM    Additional Follow-up for Phone Call Additional follow up Details #2::    yes - ok to do it that way Follow-up by: Corwin Levins MD,  March 02, 2009 1:10 PM  Additional Follow-up for Phone Call Additional follow up Details #3:: Details for Additional Follow-up Action Taken: done Additional Follow-up by: Windell Norfolk,  March 02, 2009 1:29 PM  New/Updated Medications: BENAZEPRIL-HYDROCHLOROTHIAZIDE 20-12.5 MG TABS (BENAZEPRIL-HYDROCHLOROTHIAZIDE) 2 by mouth once daily  CALLED PT TO INFORM LEFT MSG ON WORK PHONE Initial call taken by: Shelbie Proctor,  June  2.2010 1:39 PM Prescriptions: BENAZEPRIL-HYDROCHLOROTHIAZIDE 20-12.5 MG TABS (BENAZEPRIL-HYDROCHLOROTHIAZIDE) 2 by mouth once daily  #30 x 6   Entered by:   Windell Norfolk   Authorized by:   Corwin Levins MD   Signed by:   Windell Norfolk on 03/02/2009   Method used:   Electronically to        [facility]  [phone]*  (retail)       [address]       [place], [place]  [address]       Ph: [phone] or [phone]       Fax: [phone]   RxID:   [id number]

## 2010-10-31 NOTE — Letter (Signed)
Summary: New Patient letter  [facility]  [address] [place], [place] [address]   Phone: [phone]  Fax: [phone]       05/30/2010 MRN: [id number]  [facility] [place] [address] [place], [place]  [address]  Dear Barbara Gross,  Welcome to the Gastroenterology Division at [facility].    You are scheduled to see Dr.  Yancey Flemings on July 12, 2010 at 11am on the 3rd floor at [facility], [address]  We ask that you try to arrive at our office 15 minutes prior to your appointment time to allow for check-in.  We would like you to complete the enclosed self-administered evaluation form prior to your visit and bring it with you on the day of your appointment.  We will review it with you.  Also, please bring a complete list of all your medications or, if you prefer, bring the medication bottles and we will list them.  Please bring your insurance card so that we may make a copy of it.  If your insurance requires a referral to see a specialist, please bring your referral form from your primary care physician.  Co-payments are due at the time of your visit and may be paid by cash, check or credit card.     Your office visit will consist of a consult with your physician (includes a physical exam), any laboratory testing he/she may order, scheduling of any necessary diagnostic testing (e.g. x-ray, ultrasound, CT-scan), and scheduling of a procedure (e.g. Endoscopy, Colonoscopy) if required.  Please allow enough time on your schedule to allow for any/all of these possibilities.    If you cannot keep your appointment, please call [phone] to cancel or reschedule prior to your appointment date.  This allows [place] the opportunity to schedule an appointment for another patient in need of care.  If you do not cancel or reschedule by 5 p.m. the business day prior to your appointment date, you will be charged a $50.00 late cancellation/no-show fee.    Thank you  for choosing [facility] for your medical needs.  We appreciate the opportunity to care for you.  Please visit [place] at our website  to learn more about our practice.                     Sincerely,                                                             The Gastroenterology Division

## 2010-10-31 NOTE — Miscellaneous (Signed)
Summary: Orders Update   Clinical Lists Changes  Orders: Added new Referral order of Radiology Referral (Radiology) - Signed 

## 2010-10-31 NOTE — Assessment & Plan Note (Signed)
Summary: PHYSICAL--$50--PER PT--STC   Vital Signs:  Patient profile:   55 year old female Height:      65 inches Weight:      145.8 pounds BMI:     24.35 O2 Sat:      97 % Temp:     98.7 degrees F oral Pulse rate:   72 / minute BP sitting:   112 / 82  (left arm) Cuff size:   regular  Vitals Entered By: Zackery Barefoot CMA (January 03, 2009 9:14 AM)  Preventive Care Screening  Bone Density:    Date:  12/31/2007    Next Due:  12/2009    Results:  normal std dev  Colonoscopy:    Date:  03/31/2008    Next Due:  03/2013    Results:  Hyperplastic Polyp   Mammogram:    Date:  08/02/2008    Results:  normal    History of Present Illness: lost 6 lbs since last yr iwth more gym excercise; no complaints, no gross hematuria or other GU complaints  Problems Prior to Update: 1)  Microscopic Hematuria  (ICD-599.72) 2)  Preventive Health Care  (ICD-V70.0) 3)  Breast Mass, Left  (ICD-611.72) 4)  Bacterial Vaginitis  (ICD-616.10) 5)  Preventive Health Care  (ICD-V70.0) 6)  Anxiety  (ICD-300.00) 7)  Depression  (ICD-311) 8)  Routine General Medical Exam@health  Care Facl  (ICD-V70.0) 9)  Migraine Headache  (ICD-346.90) 10)  Hyperlipidemia  (ICD-272.4) 11)  Hypertension  (ICD-401.9)  Medications Prior to Update: 1)  Estradiol 1 Mg  Tabs (Estradiol) .... 1/2 Tab Qd 2)  Mevacor 20 Mg  Tabs (Lovastatin) .Marland Kitchen.. 1 By Mouth Qd 3)  Diovan Hct 320-25 Mg  Tabs (Valsartan-Hydrochlorothiazide) .Marland Kitchen.. 1 By Mouth Qd 4)  Aspirin   Powd (Aspirin) .... 81 Mg 1 By Mouth Qd 5)  Metronidazole 250 Mg  Tabs (Metronidazole) .Marland Kitchen.. 1 By Mouth Qid  Current Medications (verified): 1)  Estradiol 1 Mg  Tabs (Estradiol) .... 1/2 Tab Once Daily 2)  Mevacor 20 Mg  Tabs (Lovastatin) .Marland Kitchen.. 1 By Mouth Once Daily 3)  Diovan Hct 320-25 Mg  Tabs (Valsartan-Hydrochlorothiazide) .Marland Kitchen.. 1 By Mouth Once Daily 4)  Adult Aspirin Ec Low Strength 81 Mg Tbec (Aspirin) .Marland Kitchen.. 1po Once Daily  Allergies (verified): 1)  !  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    Alcohol use-yes     (12/24/2007)  Risk Factors:    Alcohol Use: N/A    >5 drinks/d w/in last 3 months: N/A    Caffeine Use: N/A    Diet: N/A    Exercise: N/A  Risk Factors:    Smoking Status: quit (05/31/2007)    Packs/Day: N/A    Cigars/wk: N/A    Pipe Use/wk: N/A    Cans of tobacco/wk: N/A    Passive Smoke  Exposure: N/A  Family History:    Reviewed history from 12/24/2007 and no changes required:       mother had bleedin ulcer, HTN       father with HTN, DM       grandfather with melanoma       grandmother with colon cancer       uncle iwth prostate cancer       grandmother with stroke  Social History:    Reviewed history from 12/24/2007 and no changes required:       Divorced       no children       work - Engineer, 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         all otherwise negative   Physical Exam  General:  alert  and well-developed.   Head:  normocephalic and atraumatic.   Eyes:  vision grossly intact, pupils equal, and pupils round.   Ears:  R ear normal and L ear normal.   Nose:  no external erythema and no nasal discharge.   Mouth:  no gingival abnormalities and pharynx pink and moist.   Neck:  supple and no masses.   Lungs:  normal respiratory effort and normal breath sounds.   Heart:  normal rate and regular rhythm.   Abdomen:  soft, non-tender, and normal bowel sounds.   Msk:  normal ROM, no joint tenderness, and no joint swelling.   Extremities:  no edema, no ulcers  Neurologic:  cranial nerves II-XII intact and strength normal in all extremities.     Impression & Recommendations:  Problem # 1:  Preventive Health Care (ICD-V70.0) Overall doing well, up to date, counseled on routine health concerns for screening and prevention, immunizations up to date or declined, labs reviewed  Problem # 2:  MICROSCOPIC HEMATURIA (ICD-599.72)  The following medications were removed from the medication list:    Metronidazole 250 Mg Tabs (Metronidazole) .Marland Kitchen... 1 by mouth qid to re-check urine studies, also CT urogram, and refer urology  Orders: Radiology Referral (Radiology) T-Culture, Urine ([id number]) Urology Referral (Urology) TLB-Udip w/ Micro (81001-URINE)  Complete Medication List: 1)  Estradiol 1 Mg Tabs (Estradiol) .... 1/2 tab once daily 2)  Mevacor 20 Mg Tabs (Lovastatin) .Marland Kitchen.. 1 by mouth once daily 3)  Diovan Hct 320-25 Mg Tabs (Valsartan-hydrochlorothiazide) .Marland Kitchen.. 1 by mouth once daily 4)  Adult Aspirin Ec Low Strength 81 Mg Tbec (Aspirin) .Marland Kitchen.. 1po once daily  Other Orders: Tdap => 51yrs IM (02542) Admin 1st Vaccine (70623)  Patient Instructions: 1)  yoou received the tetanus shot today 2)  You will be contacted about the referral(s) to: Urology appt, and the CT scan 3)  Please go to the Lab in the basement for your urine test today 4)  Continue all medications that you may have  been taking previously  5)  Please schedule a follow-up appointment in 1 year or sooner if needed Prescriptions: DIOVAN HCT 320-25 MG  TABS (VALSARTAN-HYDROCHLOROTHIAZIDE) 1 by mouth once daily  #90 x 3   Entered and Authorized by:   Corwin Levins MD   Signed  by:   Corwin Levins MD on 01/03/2009   Method used:   Print then Give to Patient   RxID:   [id number] MEVACOR 20 MG  TABS (LOVASTATIN) 1 by mouth once daily  #90 x 3   Entered and Authorized by:   Corwin Levins MD   Signed by:   Corwin Levins MD on 01/03/2009   Method used:   Print then Give to Patient   RxID:   [id number] ESTRADIOL 1 MG  TABS (ESTRADIOL) 1/2 tab once daily  #45 x 3   Entered and Authorized by:   Corwin Levins MD   Signed by:   Corwin Levins MD on 01/03/2009   Method used:   Print then Give to Patient   RxID:   [id number]    Immunizations Administered:  Tetanus Vaccine:    Vaccine Type: Tdap    Site: left deltoid    Mfr: GlaxoSmithKline    Dose: 0.5 ml    Route: IM    Given by: Zackery Barefoot CMA    Exp. Date: 11/24/2010    Lot #: [id number]

## 2010-10-31 NOTE — Progress Notes (Signed)
  Phone Note Call from Patient Call back at Home Phone [phone]   Caller: Patient Summary of Call: pt needs order for diagnostic mammogram and faxed to [facility] @ [phone] Initial call taken by: Migdalia Dk,  Feb 17, 2008 8:54 AM  Follow-up for Phone Call        ok - done handwritten Follow-up by: Corwin Levins MD,  Feb 17, 2008 10:19 AM  Additional Follow-up for Phone Call Additional follow up Details #1::        Rx Called faxed in to faxed to [facility] @ [phone] Additional Follow-up by: Shelbie Proctor,  Feb 17, 2008 10:30 AM

## 2010-10-31 NOTE — Miscellaneous (Signed)
Summary: BONE DENSITY  Clinical Lists Changes  Orders: Added new Test order of T-Lumbar Vertebral Assessment (77082) - Signed 

## 2010-10-31 NOTE — Progress Notes (Signed)
  Phone Note Call from Patient Call back at Home Phone [phone]   Summary of Call: Caller: Patient/[phone] Call For: dr Jonny Ruiz Summary of Call: per pt call her dermatologist recommeded for her to get a thyroid test can she get this done  Initial call taken by: Shelbie Proctor,  May 05, 2008 8:39 AM  Follow-up for Phone Call         just done 05/03/08 - normal - does not need to be done again ---- thyroid disease is not her problem - we can send results to her derm if she wants Follow-up by: Corwin Levins MD,  May 05, 2008 10:43 AM  Additional Follow-up for Phone Call           Signed by Shelbie Proctor on 05/05/2008 at 10:47 AM  ________________________________________________________________________   Follow-up for Phone Call        just done 05/03/08 - normal - does not need to be done again ---- thyroid disease is not her problem - we can send results to her derm if she wants   Follow-up by: Corwin Levins MD,  May 05, 2008 10:43 AM  Additional Follow-up for Phone Call Additional follow up Details #1::        May 05, 2008 11:07 AM called pt to inform  spoke with pt made aware Additional Follow-up by: Shelbie Proctor,  May 05, 2008 11:07 AM

## 2010-10-31 NOTE — Assessment & Plan Note (Signed)
Summary: STOMACH PAIN/NWS  #   Vital Signs:  Patient profile:   55 year old female Height:      65 inches Weight:      145.75 pounds BMI:     24.34 O2 Sat:      97 % on Room air Temp:     98.5 degrees F oral Pulse rate:   79 / minute BP sitting:   118 / 78  (left arm) Cuff size:   regular  Vitals Entered By: Zella Ball Ewing CMA Duncan Dull) (May 22, 2010 8:35 AM)  O2 Flow:  Room air  CC: Lower Abdominal pain/RE   Primary Care Tamani Durney:  Corwin Levins MD  CC:  Lower Abdominal pain/RE.  History of Present Illness: here with pain that conts to recur to the LLQ intermittent for at least 4 wks;  tx for diverticulitis and seemed to imporve july 22 per Dr Felicity Coyer (cipro and flagyl tx, no UA or labs done, had midl diarrhea with that that resolved);  started to recur approx 2 wks ago , worse for 3 days last wk and kept her up at night - could not sleep;  but seemed to start to improve onits own last thur (aug 18);  no symptoms over the weekend and no symptoms today;  no fever,  n/v, back pain (except for usual pain at night to lowre back per pt);  no bowel change such as constipaiton or furhter diarrhea;  no bladder changes such as dysuria, freq, urgency,;  no blood in bowels or bladder that she noticed.  No fever, wt loss,, loss of appetite or other constitutional symptoms but does have occas night sweats..  Last colonscopy 2009 - with hyeprplastic polyp.  On mevacor for many months to years, and diet no real change recently.    Problems Prior to Update: 1)  Diverticulitis of Colon  (ICD-562.11) 2)  Menopause, Early  (ICD-627.2) 3)  Microscopic Hematuria  (ICD-599.72) 4)  Preventive Health Care  (ICD-V70.0) 5)  Breast Mass, Left  (ICD-611.72) 6)  Bacterial Vaginitis  (ICD-616.10) 7)  Preventive Health Care  (ICD-V70.0) 8)  Anxiety  (ICD-300.00) 9)  Depression  (ICD-311) 10)  Routine General Medical Exam@health  Care Facl  (ICD-V70.0) 11)  Migraine Headache  (ICD-346.90) 12)  Hyperlipidemia   (ICD-272.4) 13)  Hypertension  (ICD-401.9)  Medications Prior to Update: 1)  Mevacor 20 Mg  Tabs (Lovastatin) .Marland Kitchen.. 1 By Mouth Once Daily 2)  Benazepril-Hydrochlorothiazide 20-12.5 Mg Tabs (Benazepril-Hydrochlorothiazide) .... 2 By Mouth Once Daily 3)  Adult Aspirin Ec Low Strength 81 Mg Tbec (Aspirin) .Marland Kitchen.. 1po Once Daily  Current Medications (verified): 1)  Mevacor 20 Mg  Tabs (Lovastatin) .Marland Kitchen.. 1 By Mouth Once Daily 2)  Benazepril-Hydrochlorothiazide 20-12.5 Mg Tabs (Benazepril-Hydrochlorothiazide) .... 2 By Mouth Once Daily 3)  Adult Aspirin Ec Low Strength 81 Mg Tbec (Aspirin) .Marland Kitchen.. 1po Once Daily  Allergies (verified): 1)  ! 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   Head:  normocephalic and atraumatic.   Eyes:  vision grossly intact, pupils equal, and pupils round.   Ears:  R ear normal and L ear normal.   Nose:  no external deformity and no nasal discharge.   Mouth:  no gingival abnormalities and pharynx pink and moist.   Neck:  supple and no masses.   Lungs:  normal respiratory effort and normal breath sounds.   Heart:  normal rate and regular rhythm.   Abdomen:  soft and normal bowel sounds.  with mild lower mid abd tender, without guarding or rebound Extremities:  no edema, no erythema    Impression & Recommendations:  Problem # 1:  ABDOMINAL PAIN, LOWER (ICD-789.09)  Her updated medication list for this problem includes:    Adult Aspirin Ec Low Strength 81 Mg Tbec (Aspirin) .Marland Kitchen... 1po once  daily unclear etiology, seemed to improve readily with cipro/flagyl one mo ago;  suspect low grade uti recurring, though cant r/o the diverticulitis, renal stone or other;  for urine and labs today;  had diarrhea with cipro so will try the cephalexin now;  consider CT if neg urine cx and/or GI consult  Orders: TLB-Udip w/ Micro (81001-URINE) T-Culture, Urine ([id number]) TLB-BMP (Basic Metabolic Panel-BMET) (80048-METABOL) TLB-CBC Platelet - w/Differential (85025-CBCD) TLB-Lipase (83690-LIPASE)  Problem # 2:  HYPERTENSION (ICD-401.9)  Her updated medication list for this problem includes:    Benazepril-hydrochlorothiazide 20-12.5 Mg Tabs (Benazepril-hydrochlorothiazide) .Marland Kitchen... 2 by mouth once daily  BP today: 118/78 Prior BP: 120/92 (04/21/2010)  Labs Reviewed: K+: 3.9 (12/29/2009) Creat: : 0.7 (12/29/2009)   Chol: 171 (12/29/2009)   HDL: 69.30 (12/29/2009)   LDL: 79 (12/29/2009)   TG: 115.0 (12/29/2009) stable overall by hx and exam, ok to continue meds/tx as is   Problem # 3:  HYPERLIPIDEMIA (ICD-272.4)  Her updated medication list for this problem includes:    Mevacor 20 Mg Tabs (Lovastatin) .Marland Kitchen... 1 by mouth once daily  Labs Reviewed: SGOT: 26 (12/29/2009)   SGPT: 28 (12/29/2009)   HDL:69.30 (12/29/2009), 60.70 (12/29/2008)  LDL:79 (12/29/2009), 80 (12/29/2008)  Chol:171 (12/29/2009), 168 (12/29/2008)  Trig:115.0 (12/29/2009), 137.0 (12/29/2008) suspect the pain not due to mevacor GI effect - ok to cont as is, and diet  Complete Medication List: 1)  Mevacor 20 Mg Tabs (Lovastatin) .Marland Kitchen.. 1 by mouth once daily 2)  Benazepril-hydrochlorothiazide 20-12.5 Mg Tabs (Benazepril-hydrochlorothiazide) .... 2 by mouth once daily 3)  Adult Aspirin Ec Low Strength 81 Mg Tbec (Aspirin) .Marland Kitchen.. 1po once daily 4)  Cephalexin 500 Mg Caps (Cephalexin) .Marland Kitchen.. 1po three times a day  Patient Instructions: 1)  Please go to the Lab in the basement for your blood and/or urine tests today  2)  Please  take all new medications as prescribed  3)  Continue all previous medications as before this visit  4)  Please call the number on the [facility] for results of your testing 5)  If the urine is neg and pain is recurring, you may need CT of the abd/pelvis to further evaluate 6)  Please schedule a follow-up appointment as needed. Prescriptions: CEPHALEXIN 500 MG CAPS (CEPHALEXIN) 1po three times a day  #30 x 0   Entered and Authorized by:   Corwin Levins MD   Signed by:   Corwin Levins MD on 05/22/2010   Method used:   Print then Give to Patient   RxID:   [id number]

## 2010-10-31 NOTE — Consult Note (Signed)
Summary: colon report/[facility]  colon report/[facility]   Imported By: Lester Leavenworth 05/11/2008 11:18:15  _____________________________________________________________________  External Attachment:    Type:   Image     Comment:   External Document

## 2010-10-31 NOTE — Medication Information (Signed)
Summary: Rx for Mammogram Screening/[facility]  Rx for Mammogram Screening/[facility]   Imported By: Esmeralda Links D'jimraou 01/26/2008 14:09:41  _____________________________________________________________________  External Attachment:    Type:   Image     Comment:   External Document

## 2010-10-31 NOTE — Assessment & Plan Note (Signed)
Summary: PHYSICAL  STC   Vital Signs:  Patient profile:   55 year old female Height:      65 inches Weight:      147.25 pounds BMI:     24.59 O2 Sat:      98 % on Room air Temp:     98.2 degrees F oral Pulse rate:   85 / minute BP sitting:   112 / 74  (left arm) Cuff size:   regular  Vitals Entered ByZella Ball Ewing (January 04, 2010 8:35 AM)  O2 Flow:  Room air  Preventive Care Screening  Mammogram:    Date:  07/01/2009    Results:  normal   CC: Adult Physical/RE   CC:  Adult Physical/RE.  History of Present Illness: overall doing well, no complaints,  Pt denies CP, sob, doe, wheezing, orthopnea, pnd, worsening LE edema, palps, dizziness or syncope   Pt denies new neuro symptoms such as headache, facial or extremity weakness   Overall good complaicne with meds, good tolerance, trying to follow her diet closely, and does walking for excercise frequerntly.    Problems Prior to Update: 1)  Menopause, Early  (ICD-627.2) 2)  Microscopic Hematuria  (ICD-599.72) 3)  Preventive Health Care  (ICD-V70.0) 4)  Breast Mass, Left  (ICD-611.72) 5)  Bacterial Vaginitis  (ICD-616.10) 6)  Preventive Health Care  (ICD-V70.0) 7)  Anxiety  (ICD-300.00) 8)  Depression  (ICD-311) 9)  Routine General Medical Exam@health  Care Facl  (ICD-V70.0) 10)  Migraine Headache  (ICD-346.90) 11)  Hyperlipidemia  (ICD-272.4) 12)  Hypertension  (ICD-401.9)  Medications Prior to Update: 1)  Estradiol 1 Mg  Tabs (Estradiol) .... 1/2 Tab Once Daily 2)  Mevacor 20 Mg  Tabs (Lovastatin) .Marland Kitchen.. 1 By Mouth Once Daily 3)  Benazepril-Hydrochlorothiazide 20-12.5 Mg Tabs (Benazepril-Hydrochlorothiazide) .... 2 By Mouth Once Daily 4)  Adult Aspirin Ec Low Strength 81 Mg Tbec (Aspirin) .Marland Kitchen.. 1po Once Daily  Current Medications (verified): 1)  Mevacor 20 Mg  Tabs (Lovastatin) .Marland Kitchen.. 1 By Mouth Once Daily 2)  Benazepril-Hydrochlorothiazide 20-12.5 Mg Tabs (Benazepril-Hydrochlorothiazide) .... 2 By Mouth Once Daily 3)   Adult Aspirin Ec Low Strength 81 Mg Tbec (Aspirin) .Marland Kitchen.. 1po Once Daily  Allergies (verified): 1)  ! 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         all otherwise negative per pt -  except for recurring pain to first and second toe right foot  Physical Exam  General:  alert and well-developed.   Head:  normocephalic and atraumatic.   Eyes:  vision grossly intact, pupils equal, and pupils round.   Ears:  R ear normal and L ear normal.   Nose:  no external deformity and no nasal discharge.  Mouth:  no gingival abnormalities and pharynx pink and moist.   Neck:  supple and no masses.   Lungs:  normal respiratory effort and normal breath sounds.   Heart:  normal rate and regular rhythm.   Abdomen:  soft, non-tender, and normal bowel sounds.   Msk:  no joint tenderness and no joint swelling.  , has bunion noninflamed right foot Extremities:  no edema,  no erythema  Neurologic:  cranial nerves II-XII intact and strength normal in all extremities.   Skin:  color normal and no rashes.   Psych:  normally interactive and not anxious appearing.     Impression & Recommendations:  Problem # 1:  Preventive Health Care (ICD-V70.0)  Overall doing well, age appropriate education and counseling updated and referral for appropriate preventive services done unless declined, immunizations up to date or declined, diet counseling done if overweight, urged to quit smoking if smokes , most recent labs reviewed and current ordered if appropriate, ecg reviewed or declined (interpretation per ECG scanned in the EMR if done); information regarding Medicare Prevention requirements given if appropriate   Orders: EKG w/ Interpretation (93000)  Problem # 2:  MENOPAUSE, EARLY (ICD-627.2)  The following medications were removed from the medication list:    Estradiol 1 Mg Tabs (Estradiol) .Marland Kitchen... 1/2 tab once daily now 20 yrs out, ok to stop the estrogen, to check dxa  Orders: T-Bone Densitometry [phone])  Problem # 3:  HYPERTENSION (ICD-401.9)  Her updated medication list for this problem includes:    Benazepril-hydrochlorothiazide 20-12.5 Mg Tabs (Benazepril-hydrochlorothiazide) .Marland Kitchen... 2 by mouth once daily  BP today: 112/74 Prior BP: 112/82 (01/03/2009)  Labs Reviewed: K+: 3.9 (12/29/2009) Creat: : 0.7 (12/29/2009)   Chol: 171 (12/29/2009)   HDL: 69.30 (12/29/2009)   LDL: 79 (12/29/2009)   TG: 115.0 (12/29/2009) stable overall by hx and exam, ok to continue meds/tx as is   Complete Medication List: 1)  Mevacor 20 Mg Tabs (Lovastatin) .Marland Kitchen.. 1 by mouth once daily 2)  Benazepril-hydrochlorothiazide 20-12.5 Mg Tabs (Benazepril-hydrochlorothiazide) .... 2 by mouth once daily 3)  Adult Aspirin Ec Low Strength 81 Mg Tbec (Aspirin) .Marland Kitchen.. 1po once daily  Patient Instructions: 1)  stop the estrogen 2)  please schedule the bone density  before leaving today 3)   Continue all previous medications as before this visit  4)  please see podiatry for the foot bunion and toe issue 5)  Please schedule a follow-up appointment in 1 year or sooner if needed Prescriptions: BENAZEPRIL-HYDROCHLOROTHIAZIDE 20-12.5 MG TABS (BENAZEPRIL-HYDROCHLOROTHIAZIDE) 2 by mouth once daily  #60 x 11   Entered and Authorized by:   Corwin Levins MD   Signed by:   Corwin Levins MD on 01/04/2010   Method used:   Electronically to        [facility]  [phone]* (retail)       [address]       [place], [place]  [address]       Ph: [phone] or [phone]       Fax: [phone]   RxID:   [id number] MEVACOR 20 MG  TABS (LOVASTATIN) 1 by mouth once daily  #90 x 3   Entered and Authorized by:   Corwin Levins MD   Signed by:   Corwin Levins MD on 01/04/2010   Method used:   Electronically to        [facility]  [phone]* (retail)  [address]       [place], [place]  [address]       Ph: [phone] or [phone]       Fax: [phone]   RxID:   [id number]

## 2010-10-31 NOTE — Progress Notes (Signed)
Summary: CT scan  Phone Note Call from Patient Call back at Work Phone [phone]   Summary of Call: Patient called stating that she feels better, and the ABX are working. Patient is wondering if she can cx the CT scan that was scheduled for her b/c she is feeling fine. Please advise. Initial call taken by: Lucious Groves CMA,  May 29, 2010 8:33 AM  Follow-up for Phone Call        ok to hold off on CT then, but I think she should still see GI for consideration of further eval/tx - will do Follow-up by: Corwin Levins MD,  May 29, 2010 1:04 PM  Additional Follow-up for Phone Call Additional follow up Details #1::        Pt informed and wil expect call from [facility] with appt info Additional Follow-up by: Margaret Pyle, CMA,  May 29, 2010 3:05 PM

## 2010-10-31 NOTE — Progress Notes (Signed)
  Phone Note Other Incoming   Summary of Call: done hardcopy Initial call taken by: Corwin Levins MD,  January 22, 2008 5:35 PM  Follow-up for Phone Call        January 23, 2008 9:18 AM faxed order  to  [phone]    Additional Follow-up for Phone Call Additional follow up Details #2::    [facility] needs referral sent for mammogram Follow-up by: Maris Berger,  January 22, 2008 12:07 PM

## 2010-10-31 NOTE — Progress Notes (Signed)
Summary: mammogram  Phone Note From Other Clinic Call back at [phone]   Caller: Receptionist/[facility]- Charlene Summary of Call: need written order for diagnostic mammogram Initial call taken by: Migdalia Dk,  July 15, 2008 3:32 PM  Follow-up for Phone Call        will order per EMR Follow-up by: Corwin Levins MD,  July 15, 2008 3:44 PM  Additional Follow-up for Phone Call Additional follow up Details #1::        done, order faxed Additional Follow-up by: Migdalia Dk,  July 16, 2008 8:16 AM  New Problems: BREAST MASS, LEFT (ICD-611.72)   New Problems: BREAST MASS, LEFT (ICD-611.72)

## 2011-01-03 ENCOUNTER — Other Ambulatory Visit: Payer: Self-pay | Admitting: Internal Medicine

## 2011-01-08 ENCOUNTER — Other Ambulatory Visit (INDEPENDENT_AMBULATORY_CARE_PROVIDER_SITE_OTHER): Payer: 59 | Admitting: Internal Medicine

## 2011-01-08 ENCOUNTER — Other Ambulatory Visit: Payer: Self-pay | Admitting: Internal Medicine

## 2011-01-08 ENCOUNTER — Other Ambulatory Visit: Payer: Self-pay

## 2011-01-08 ENCOUNTER — Other Ambulatory Visit (INDEPENDENT_AMBULATORY_CARE_PROVIDER_SITE_OTHER): Payer: 59

## 2011-01-08 DIAGNOSIS — Z0000 Encounter for general adult medical examination without abnormal findings: Secondary | ICD-10-CM

## 2011-01-08 LAB — HEPATIC FUNCTION PANEL
ALT: 23 U/L (ref 0–35)
AST: 25 U/L (ref 0–37)
Albumin: 4.2 g/dL (ref 3.5–5.2)
Alkaline Phosphatase: 67 U/L (ref 39–117)
Bilirubin, Direct: 0.1 mg/dL (ref 0.0–0.3)
Total Bilirubin: 0.8 mg/dL (ref 0.3–1.2)
Total Protein: 7 g/dL (ref 6.0–8.3)

## 2011-01-08 LAB — BASIC METABOLIC PANEL
BUN: 17 mg/dL (ref 6–23)
CO2: 29 mEq/L (ref 19–32)
Calcium: 9.8 mg/dL (ref 8.4–10.5)
Chloride: 97 mEq/L (ref 96–112)
Creatinine, Ser: 0.6 mg/dL (ref 0.4–1.2)
GFR: 108.44 mL/min (ref >60.00)
Glucose, Bld: 87 mg/dL (ref 70–99)
Potassium: 4.5 mEq/L (ref 3.5–5.1)
Sodium: 137 mEq/L (ref 135–145)

## 2011-01-08 LAB — URINALYSIS, ROUTINE W REFLEX MICROSCOPIC
Bilirubin Urine: NEGATIVE
Ketones, ur: NEGATIVE
Nitrite: NEGATIVE
Specific Gravity, Urine: 1.015 (ref 1.000–1.030)
Total Protein, Urine: NEGATIVE
Urine Glucose: NEGATIVE
Urobilinogen, UA: 0.2 (ref 0.0–1.0)
pH: 6 (ref 5.0–8.0)

## 2011-01-08 LAB — LIPID PANEL
Cholesterol: 176 mg/dL (ref 0–200)
HDL: 64.5 mg/dL (ref >39.00)
LDL Cholesterol: 94 mg/dL (ref 0–99)
Total CHOL/HDL Ratio: 3
Triglycerides: 88 mg/dL (ref 0.0–149.0)
VLDL: 17.6 mg/dL (ref 0.0–40.0)

## 2011-01-08 LAB — CBC WITH DIFFERENTIAL/PLATELET
Basophils Absolute: 0 10*3/uL (ref 0.0–0.1)
Basophils Relative: 0.4 % (ref 0.0–3.0)
Eosinophils Absolute: 0.5 10*3/uL (ref 0.0–0.7)
Eosinophils Relative: 5.2 % — ABNORMAL HIGH (ref 0.0–5.0)
HCT: 42.3 % (ref 36.0–46.0)
Hemoglobin: 14.7 g/dL (ref 12.0–15.0)
Lymphocytes Relative: 35.3 % (ref 12.0–46.0)
Lymphs Abs: 3.1 10*3/uL (ref 0.7–4.0)
MCHC: 34.8 g/dL (ref 30.0–36.0)
MCV: 98.2 fl (ref 78.0–100.0)
Monocytes Absolute: 0.8 10*3/uL (ref 0.1–1.0)
Monocytes Relative: 9.1 % (ref 3.0–12.0)
Neutro Abs: 4.4 10*3/uL (ref 1.4–7.7)
Neutrophils Relative %: 50 % (ref 43.0–77.0)
Platelets: 343 10*3/uL (ref 150.0–400.0)
RBC: 4.31 Mil/uL (ref 3.87–5.11)
RDW: 13 % (ref 11.5–14.6)
WBC: 8.7 10*3/uL (ref 4.5–10.5)

## 2011-01-08 LAB — TSH: TSH: 0.66 u[IU]/mL (ref 0.35–5.50)

## 2011-01-12 ENCOUNTER — Ambulatory Visit (INDEPENDENT_AMBULATORY_CARE_PROVIDER_SITE_OTHER): Payer: 59 | Admitting: Internal Medicine

## 2011-01-12 ENCOUNTER — Encounter: Payer: Self-pay | Admitting: Internal Medicine

## 2011-01-12 VITALS — BP 110/70 | HR 78 | Temp 98.3°F | Ht 65.0 in | Wt 146.0 lb

## 2011-01-12 DIAGNOSIS — I10 Essential (primary) hypertension: Secondary | ICD-10-CM

## 2011-01-12 DIAGNOSIS — K635 Polyp of colon: Secondary | ICD-10-CM | POA: Insufficient documentation

## 2011-01-12 DIAGNOSIS — Z0000 Encounter for general adult medical examination without abnormal findings: Secondary | ICD-10-CM

## 2011-01-12 DIAGNOSIS — Z00121 Encounter for routine child health examination with abnormal findings: Secondary | ICD-10-CM | POA: Insufficient documentation

## 2011-01-12 MED ORDER — BENAZEPRIL-HYDROCHLOROTHIAZIDE 20-12.5 MG PO TABS: 1.0000 | ORAL_TABLET | Freq: Every day | ORAL | Status: DC

## 2011-01-12 MED ORDER — LOVASTATIN 20 MG PO TABS: 20.0000 mg | ORAL_TABLET | Freq: Every day | ORAL | Status: DC

## 2011-01-12 NOTE — Assessment & Plan Note (Addendum)
stable overall by hx and exam, most recent lab reviewed with pt, and pt to continue medical treatment as before   BP Readings from Last 3 Encounters:  01/12/11 110/70  05/22/10 118/78  04/21/10 120/92

## 2011-01-12 NOTE — Patient Instructions (Signed)
Your EKG, and labs are good today You are otherwise up to date with prevention Continue all other medications as before- all meds sent to [facility] Please return in 1 year for your yearly visit, or sooner if needed, with Lab testing done 3-5 days before

## 2011-01-14 ENCOUNTER — Encounter: Payer: Self-pay | Admitting: Internal Medicine

## 2011-01-14 NOTE — Progress Notes (Signed)
Subjective:    Patient ID: Barbara Gross, female    DOB: [birth year], 55 y.o.   MRN: [id number]  HPI  Here for wellness and f/u;  Overall doing ok;  Pt denies CP, worsening SOB, DOE, wheezing, orthopnea, PND, worsening LE edema, palpitations, dizziness or syncope.  Pt denies neurological change such as new Headache, facial or extremity weakness.  Pt denies polydipsia, polyuria, or low sugar symptoms. Pt states overall good compliance with treatment and medications, good tolerability, and trying to follow lower cholesterol diet.  Pt denies worsening depressive symptoms, suicidal ideation or panic. No fever, wt loss, night sweats, loss of appetite, or other constitutional symptoms.  Pt states good ability with ADL's, low fall risk, home safety reviewed and adequate, no significant changes in hearing or vision, and active with exercise. Walks daily for 2 miles.   Past Medical History  Diagnosis Date  . HYPERLIPIDEMIA 05/31/2007  . ANXIETY 12/24/2007  . DEPRESSION 12/24/2007  . MIGRAINE HEADACHE 05/31/2007  . HYPERTENSION 05/31/2007  . DIVERTICULITIS OF COLON 04/21/2010  . Microscopic hematuria 01/03/2009  . BREAST MASS, LEFT 07/15/2008  . BACTERIAL VAGINITIS 12/24/2007  . MENOPAUSE, EARLY 01/04/2010  . ABDOMINAL PAIN, LOWER 05/22/2010   Past Surgical History  Procedure Date  . Abdominal hysterectomy   . Oophorectomy     reports that she has never smoked. She does not have any smokeless tobacco history on file. She reports that she drinks alcohol. Her drug history not on file. family history includes Cancer in her others; Diabetes in her father; Hypertension in her father and mother; Melanoma in her other; Stroke in her other; and Ulcers in her mother. Allergies  Allergen Reactions  . Venlafaxine    Current Outpatient Prescriptions on File Prior to Visit  Medication Sig Dispense Refill  . aspirin 81 MG EC tablet Take 81 mg by mouth daily.         Review of Systems Review of Systems    Constitutional: Negative for diaphoresis and unexpected weight change.  HENT: Negative for drooling and tinnitus.   Eyes: Negative for photophobia and visual disturbance.  Respiratory: Negative for choking and stridor.   Gastrointestinal: Negative for vomiting and blood in stool.  Genitourinary: Negative for hematuria and decreased urine volume.  Musculoskeletal: Negative for gait problem.  Skin: Negative for color change and wound.  Neurological: Negative for tremors and numbness.  Psychiatric/Behavioral: Negative for decreased concentration. The patient is not hyperactive.       Objective:   Physical Exam BP 110/70  Pulse 78  Temp(Src) 98.3 F (36.8 C) (Oral)  Ht 5\' 5"  (1.651 m)  Wt 146 lb (66.225 kg)  BMI 24.30 kg/m2  SpO2 98% Physical Exam  VS noted Constitutional: Pt is oriented to person, place, and time. Appears well-developed and well-nourished.  HENT:  Head: Normocephalic and atraumatic.  Right Ear: External ear normal.  Left Ear: External ear normal.  Nose: Nose normal.  Mouth/Throat: Oropharynx is clear and moist.  Eyes: Conjunctivae and EOM are normal. Pupils are equal, round, and reactive to light.  Neck: Normal range of motion. Neck supple. No JVD present. No tracheal deviation present.  Cardiovascular: Normal rate, regular rhythm, normal heart sounds and intact distal pulses.   Pulmonary/Chest: Effort normal and breath sounds normal.  Abdominal: Soft. Bowel sounds are normal. There is no tenderness.  Musculoskeletal: Normal range of motion. Exhibits no edema.  Lymphadenopathy:  Has no cervical adenopathy.  Neurological: Pt is alert and oriented to person, place, and time.  Pt has normal reflexes. No cranial nerve deficit.  Skin: Skin is warm and dry. No rash noted.  Psychiatric:  Has  normal mood and affect. Behavior is normal. 1+ nervous    Lab Results  Component Value Date   WBC 8.7 01/08/2011   HGB 14.7 01/08/2011   HCT 42.3 01/08/2011   PLT 343.0 01/08/2011    CHOL 176 01/08/2011   TRIG 88.0 01/08/2011   HDL 64.50 01/08/2011   ALT 23 01/08/2011   AST 25 01/08/2011   NA 137 01/08/2011   K 4.5 01/08/2011   CL 97 01/08/2011   CREATININE 0.6 01/08/2011   BUN 17 01/08/2011   CO2 29 01/08/2011   TSH 0.66 01/08/2011      Assessment & Plan:

## 2011-01-14 NOTE — Assessment & Plan Note (Signed)

## 2011-03-06 ENCOUNTER — Encounter: Payer: Self-pay | Admitting: Internal Medicine

## 2011-04-22 ENCOUNTER — Emergency Department (HOSPITAL_COMMUNITY): Payer: BC Managed Care – PPO

## 2011-04-22 ENCOUNTER — Emergency Department (HOSPITAL_COMMUNITY)
Admission: EM | Admit: 2011-04-22 | Discharge: 2011-04-22 | Disposition: A | Discharge status: Home / Self Care | Payer: BC Managed Care – PPO | Attending: Emergency Medicine | Admitting: Emergency Medicine

## 2011-04-22 DIAGNOSIS — T40605A Adverse effect of unspecified narcotics, initial encounter: Secondary | ICD-10-CM | POA: Insufficient documentation

## 2011-04-22 DIAGNOSIS — I10 Essential (primary) hypertension: Secondary | ICD-10-CM | POA: Insufficient documentation

## 2011-04-22 DIAGNOSIS — Z79899 Other long term (current) drug therapy: Secondary | ICD-10-CM | POA: Insufficient documentation

## 2011-04-22 DIAGNOSIS — E7800 Pure hypercholesterolemia, unspecified: Secondary | ICD-10-CM | POA: Insufficient documentation

## 2011-04-22 DIAGNOSIS — R112 Nausea with vomiting, unspecified: Secondary | ICD-10-CM | POA: Insufficient documentation

## 2011-04-22 DIAGNOSIS — R109 Unspecified abdominal pain: Secondary | ICD-10-CM | POA: Insufficient documentation

## 2011-04-22 LAB — CBC
HCT: 42.6 % (ref 36.0–46.0)
Hemoglobin: 14.6 g/dL (ref 12.0–15.0)
MCH: 33 pg (ref 26.0–34.0)
MCHC: 34.3 g/dL (ref 30.0–36.0)
MCV: 96.2 fL (ref 78.0–100.0)
Platelets: 332 10*3/uL (ref 150–400)
RBC: 4.43 MIL/uL (ref 3.87–5.11)
RDW: 12.8 % (ref 11.5–15.5)
WBC: 9.4 10*3/uL (ref 4.0–10.5)

## 2011-04-22 LAB — URINALYSIS, ROUTINE W REFLEX MICROSCOPIC
Glucose, UA: NEGATIVE mg/dL
Ketones, ur: 15 mg/dL — AB
Nitrite: NEGATIVE
Protein, ur: NEGATIVE mg/dL
Specific Gravity, Urine: 1.026 (ref 1.005–1.030)
Urobilinogen, UA: 0.2 mg/dL (ref 0.0–1.0)
pH: 5.5 (ref 5.0–8.0)

## 2011-04-22 LAB — DIFFERENTIAL
Basophils Absolute: 0 10*3/uL (ref 0.0–0.1)
Basophils Relative: 0 % (ref 0–1)
Eosinophils Absolute: 0.1 10*3/uL (ref 0.0–0.7)
Eosinophils Relative: 1 % (ref 0–5)
Lymphocytes Relative: 21 % (ref 12–46)
Lymphs Abs: 2 10*3/uL (ref 0.7–4.0)
Monocytes Absolute: 0.5 10*3/uL (ref 0.1–1.0)
Monocytes Relative: 5 % (ref 3–12)
Neutro Abs: 6.8 10*3/uL (ref 1.7–7.7)
Neutrophils Relative %: 72 % (ref 43–77)

## 2011-04-22 LAB — COMPREHENSIVE METABOLIC PANEL
ALT: 24 U/L (ref 0–35)
AST: 27 U/L (ref 0–37)
Albumin: 4.3 g/dL (ref 3.5–5.2)
Alkaline Phosphatase: 79 U/L (ref 39–117)
BUN: 20 mg/dL (ref 6–23)
CO2: 28 mEq/L (ref 19–32)
Calcium: 10.2 mg/dL (ref 8.4–10.5)
Chloride: 96 mEq/L (ref 96–112)
Creatinine, Ser: 0.77 mg/dL (ref 0.50–1.10)
GFR calc Af Amer: >60 mL/min (ref >60)
GFR calc non Af Amer: >60 mL/min (ref >60)
Glucose, Bld: 121 mg/dL — ABNORMAL HIGH (ref 70–99)
Potassium: 3.7 mEq/L (ref 3.5–5.1)
Sodium: 136 mEq/L (ref 135–145)
Total Bilirubin: 0.3 mg/dL (ref 0.3–1.2)
Total Protein: 7.9 g/dL (ref 6.0–8.3)

## 2011-04-22 LAB — URINE MICROSCOPIC-ADD ON

## 2011-04-23 LAB — URINE CULTURE
Colony Count: NO GROWTH
Culture  Setup Time: 201207221341
Culture: NO GROWTH

## 2011-04-24 ENCOUNTER — Ambulatory Visit (INDEPENDENT_AMBULATORY_CARE_PROVIDER_SITE_OTHER): Payer: BC Managed Care – PPO | Admitting: Internal Medicine

## 2011-04-24 ENCOUNTER — Encounter: Payer: Self-pay | Admitting: Internal Medicine

## 2011-04-24 VITALS — BP 132/88 | HR 89 | Temp 98.4°F | Ht 65.0 in | Wt 144.1 lb

## 2011-04-24 DIAGNOSIS — I10 Essential (primary) hypertension: Secondary | ICD-10-CM

## 2011-04-24 DIAGNOSIS — M5416 Radiculopathy, lumbar region: Secondary | ICD-10-CM | POA: Insufficient documentation

## 2011-04-24 DIAGNOSIS — IMO0002 Reserved for concepts with insufficient information to code with codable children: Secondary | ICD-10-CM

## 2011-04-24 DIAGNOSIS — F411 Generalized anxiety disorder: Secondary | ICD-10-CM

## 2011-04-24 MED ORDER — GABAPENTIN 100 MG PO CAPS: ORAL_CAPSULE | ORAL | Status: DC

## 2011-04-24 MED ORDER — ONDANSETRON HCL 4 MG PO TABS: 4.0000 mg | ORAL_TABLET | Freq: Every day | ORAL | Status: DC | PRN

## 2011-04-24 MED ORDER — KETOROLAC TROMETHAMINE 30 MG/ML IJ SOLN
30.0000 mg | Freq: Once | INTRAMUSCULAR | Status: AC
  Administered 2011-04-24: 30 mg via INTRAVENOUS

## 2011-04-24 MED ORDER — OXYCODONE HCL 5 MG PO TABS: ORAL_TABLET | ORAL | Status: DC

## 2011-04-24 MED ORDER — ONDANSETRON HCL 4 MG/5ML PO SOLN: ORAL | Status: DC

## 2011-04-24 NOTE — Patient Instructions (Addendum)
Take all new medications as prescribed Remember the oxycontin can cause nausea and constipation Remember the gabapentin is started at one pill at night the first day, then twice per day the second day, then three times per day after that (remember this is slowly started to avoid sleepiness) Continue all other medications as before You will be contacted regarding the referral for: MRI for the lumbar spine If abnormal, you will likely need to be referred to Neurosurgury for evalaution Please return in 9 mo with Lab testing done 3-5 days before (april 2013)

## 2011-04-24 NOTE — Progress Notes (Signed)
Subjective:    Patient ID: Barbara Gross, female    DOB: 01-21-1956, 55 y.o.   MRN: [id number]  HPI  Here with acute c/o right back/side and rlq pain;  Started with pain early Sun AM July 22 in the midline lower back which has somewhat improved, but subsequently has had increasingly severe, persistent, constant pain as above,  bowel or bladder change, fever, wt loss,  worsening LE pain/numbness/weakness, gait change or falls.  Was seen in ER with complete evaluation including routine labs, UA, CT abd/pelvis neg for acute.  Pt released for home but pain persists now 8/10, nothing makes better or worse including position change, and no rash, swelling erythema.  Pt denies chest pain, increased sob or doe, wheezing, orthopnea, PND, increased LE swelling, palpitations, dizziness or syncope.   Pt denies polydipsia, polyuria.  Denies worsening depressive symptoms, suicidal ideation, or panic Past Medical History  Diagnosis Date  . HYPERLIPIDEMIA 05/31/2007  . ANXIETY 12/24/2007  . DEPRESSION 12/24/2007  . MIGRAINE HEADACHE 05/31/2007  . HYPERTENSION 05/31/2007  . DIVERTICULITIS OF COLON 04/21/2010  . Microscopic hematuria 01/03/2009  . BREAST MASS, LEFT 07/15/2008  . BACTERIAL VAGINITIS 12/24/2007  . MENOPAUSE, EARLY 01/04/2010  . ABDOMINAL PAIN, LOWER 05/22/2010   Past Surgical History  Procedure Date  . Abdominal hysterectomy   . Oophorectomy     reports that she has never smoked. She does not have any smokeless tobacco history on file. She reports that she drinks alcohol. Her drug history not on file. family history includes Cancer in her others; Diabetes in her father; Hypertension in her father and mother; Melanoma in her other; Stroke in her other; and Ulcers in her mother. Allergies  Allergen Reactions  . Codeine Nausea And Vomiting  . Hydrocodone Nausea And Vomiting  . Venlafaxine    Current Outpatient Prescriptions on File Prior to Visit  Medication Sig Dispense Refill  . aspirin 81 MG  EC tablet Take 81 mg by mouth daily.        . benazepril-hydrochlorthiazide (LOTENSIN HCT) 20-12.5 MG per tablet Take 1 tablet by mouth daily.  90 tablet  11  . lovastatin (MEVACOR) 20 MG tablet Take 1 tablet (20 mg total) by mouth daily.  90 tablet  3   Review of Systems Review of Systems  Constitutional: Negative for diaphoresis and unexpected weight change.  HENT: Negative for drooling and tinnitus.   Eyes: Negative for photophobia and visual disturbance.  Respiratory: Negative for choking and stridor.   Gastrointestinal: Negative for vomiting and blood in stool.  Genitourinary: Negative for hematuria and decreased urine volume.      Objective:   Physical Exam BP 132/88  Pulse 89  Temp(Src) 98.4 F (36.9 C) (Oral)  Ht 5\' 5"  (1.651 m)  Wt 144 lb 2 oz (65.375 kg)  BMI 23.98 kg/m2  SpO2 98% Physical Exam  VS noted Constitutional: Pt appears well-developed and well-nourished.  HENT: Head: Normocephalic.  Right Ear: External ear normal.  Left Ear: External ear normal.  Eyes: Conjunctivae and EOM are normal. Pupils are equal, round, and reactive to light.  Neck: Normal range of motion. Neck supple.  Cardiovascular: Normal rate and regular rhythm.   Pulmonary/Chest: Effort normal and breath sounds normal.  Abd:  Soft, NT, non-distended, + BS Neurological: Pt is alert. No cranial nerve deficit. LE motor/sens/dtr/gait intact Skin: Skin is warm. No erythema.  Psychiatric: Pt behavior is normal. Thought content normal. 1+ nervous        Assessment & Plan:

## 2011-04-24 NOTE — Assessment & Plan Note (Addendum)
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

## 2011-04-26 ENCOUNTER — Encounter: Payer: Self-pay | Admitting: Internal Medicine

## 2011-04-26 NOTE — Assessment & Plan Note (Addendum)
Mild situational increase, overall stable, no tx change at this time,  to f/u any worsening symptoms or concerns

## 2011-04-26 NOTE — Assessment & Plan Note (Signed)
stable overall by hx and exam, most recent data reviewed with pt, and pt to continue medical treatment as before  BP Readings from Last 3 Encounters:  04/24/11 132/88  01/12/11 110/70  05/22/10 118/78

## 2011-05-08 ENCOUNTER — Ambulatory Visit (HOSPITAL_COMMUNITY)
Admission: RE | Admit: 2011-05-08 | Discharge: 2011-05-08 | Disposition: A | Discharge status: Home / Self Care | Payer: BC Managed Care – PPO | Source: Ambulatory Visit | Attending: Internal Medicine | Admitting: Internal Medicine

## 2011-05-08 DIAGNOSIS — M5416 Radiculopathy, lumbar region: Secondary | ICD-10-CM

## 2011-05-08 DIAGNOSIS — M545 Low back pain, unspecified: Secondary | ICD-10-CM | POA: Insufficient documentation

## 2011-05-08 DIAGNOSIS — M129 Arthropathy, unspecified: Secondary | ICD-10-CM | POA: Insufficient documentation

## 2011-05-09 ENCOUNTER — Other Ambulatory Visit: Payer: Self-pay | Admitting: Internal Medicine

## 2011-05-09 DIAGNOSIS — M5416 Radiculopathy, lumbar region: Secondary | ICD-10-CM

## 2011-07-04 ENCOUNTER — Other Ambulatory Visit: Payer: Self-pay | Admitting: Internal Medicine

## 2011-10-02 ENCOUNTER — Other Ambulatory Visit: Payer: Self-pay | Admitting: Internal Medicine

## 2011-11-26 ENCOUNTER — Telehealth: Payer: Self-pay

## 2011-11-26 DIAGNOSIS — Z0000 Encounter for general adult medical examination without abnormal findings: Secondary | ICD-10-CM

## 2011-11-26 NOTE — Telephone Encounter (Signed)
Put order in for physical labs. 

## 2012-01-09 ENCOUNTER — Other Ambulatory Visit (INDEPENDENT_AMBULATORY_CARE_PROVIDER_SITE_OTHER): Payer: BC Managed Care – PPO

## 2012-01-09 DIAGNOSIS — Z0000 Encounter for general adult medical examination without abnormal findings: Secondary | ICD-10-CM

## 2012-01-09 LAB — CBC WITH DIFFERENTIAL/PLATELET
Basophils Absolute: 0 10*3/uL (ref 0.0–0.1)
Basophils Relative: 0.4 % (ref 0.0–3.0)
Eosinophils Absolute: 0.4 10*3/uL (ref 0.0–0.7)
Eosinophils Relative: 4.7 % (ref 0.0–5.0)
HCT: 42.6 % (ref 36.0–46.0)
Hemoglobin: 14.4 g/dL (ref 12.0–15.0)
Lymphocytes Relative: 36.8 % (ref 12.0–46.0)
Lymphs Abs: 3.2 10*3/uL (ref 0.7–4.0)
MCHC: 33.8 g/dL (ref 30.0–36.0)
MCV: 99.1 fl (ref 78.0–100.0)
Monocytes Absolute: 0.7 10*3/uL (ref 0.1–1.0)
Monocytes Relative: 8.6 % (ref 3.0–12.0)
Neutro Abs: 4.3 10*3/uL (ref 1.4–7.7)
Neutrophils Relative %: 49.5 % (ref 43.0–77.0)
Platelets: 329 10*3/uL (ref 150.0–400.0)
RBC: 4.3 Mil/uL (ref 3.87–5.11)
RDW: 13.4 % (ref 11.5–14.6)
WBC: 8.6 10*3/uL (ref 4.5–10.5)

## 2012-01-09 LAB — URINALYSIS, ROUTINE W REFLEX MICROSCOPIC
Bilirubin Urine: NEGATIVE
Ketones, ur: NEGATIVE
Nitrite: NEGATIVE
Specific Gravity, Urine: 1.015 (ref 1.000–1.030)
Total Protein, Urine: NEGATIVE
Urine Glucose: NEGATIVE
Urobilinogen, UA: 0.2 (ref 0.0–1.0)
pH: 6.5 (ref 5.0–8.0)

## 2012-01-09 LAB — HEPATIC FUNCTION PANEL
ALT: 25 U/L (ref 0–35)
AST: 26 U/L (ref 0–37)
Albumin: 4.6 g/dL (ref 3.5–5.2)
Alkaline Phosphatase: 59 U/L (ref 39–117)
Bilirubin, Direct: 0.1 mg/dL (ref 0.0–0.3)
Total Bilirubin: 0.5 mg/dL (ref 0.3–1.2)
Total Protein: 7.7 g/dL (ref 6.0–8.3)

## 2012-01-09 LAB — BASIC METABOLIC PANEL
BUN: 18 mg/dL (ref 6–23)
CO2: 29 mEq/L (ref 19–32)
Calcium: 9.8 mg/dL (ref 8.4–10.5)
Chloride: 102 mEq/L (ref 96–112)
Creatinine, Ser: 0.7 mg/dL (ref 0.4–1.2)
GFR: 86.45 mL/min (ref >60.00)
Glucose, Bld: 92 mg/dL (ref 70–99)
Potassium: 4.1 mEq/L (ref 3.5–5.1)
Sodium: 142 mEq/L (ref 135–145)

## 2012-01-09 LAB — LIPID PANEL
Cholesterol: 186 mg/dL (ref 0–200)
HDL: 67.2 mg/dL (ref >39.00)
LDL Cholesterol: 90 mg/dL (ref 0–99)
Total CHOL/HDL Ratio: 3
Triglycerides: 142 mg/dL (ref 0.0–149.0)
VLDL: 28.4 mg/dL (ref 0.0–40.0)

## 2012-01-09 LAB — TSH: TSH: 0.63 u[IU]/mL (ref 0.35–5.50)

## 2012-01-14 ENCOUNTER — Encounter: Payer: Self-pay | Admitting: Internal Medicine

## 2012-01-14 ENCOUNTER — Ambulatory Visit (INDEPENDENT_AMBULATORY_CARE_PROVIDER_SITE_OTHER): Payer: BC Managed Care – PPO | Admitting: Internal Medicine

## 2012-01-14 VITALS — BP 110/78 | HR 69 | Temp 98.3°F | Ht 65.0 in | Wt 141.2 lb

## 2012-01-14 DIAGNOSIS — Z0000 Encounter for general adult medical examination without abnormal findings: Secondary | ICD-10-CM

## 2012-01-14 DIAGNOSIS — F411 Generalized anxiety disorder: Secondary | ICD-10-CM

## 2012-01-14 NOTE — Assessment & Plan Note (Signed)
Stable, overall, to cont tx as is

## 2012-01-14 NOTE — Progress Notes (Signed)
Subjective:    Patient ID: Barbara Gross, female    DOB: [birth year], 56 y.o.   MRN: [id number]  HPI  Here for wellness and f/u;  Overall doing ok;  Pt denies CP, worsening SOB, DOE, wheezing, orthopnea, PND, worsening LE edema, palpitations, dizziness or syncope.  Pt denies neurological change such as new Headache, facial or extremity weakness.  Pt denies polydipsia, polyuria, or low sugar symptoms. Pt states overall good compliance with treatment and medications, good tolerability, and trying to follow lower cholesterol diet.  Pt denies worsening depressive symptoms, suicidal ideation or panic. No fever, wt loss, loss of appetite, or other constitutional symptoms except for occasional daytime and nighttime sweats without fever, malignancy, endo abnormal, rheum dz..  Pt states good ability with ADL's, low fall risk, home safety reviewed and adequate, no significant changes in hearing or vision, and occasionally active with exercise.  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  Divorced, no children. Past Medical History  Diagnosis Date  . HYPERLIPIDEMIA 05/31/2007  . ANXIETY 12/24/2007  . DEPRESSION 12/24/2007  . MIGRAINE HEADACHE 05/31/2007  . HYPERTENSION 05/31/2007  . DIVERTICULITIS OF COLON 04/21/2010  . Microscopic hematuria 01/03/2009  . BREAST MASS, LEFT 07/15/2008  . BACTERIAL VAGINITIS 12/24/2007  . MENOPAUSE, EARLY 01/04/2010  . ABDOMINAL PAIN, LOWER 05/22/2010   Past Surgical History  Procedure Date  . Abdominal hysterectomy   . Oophorectomy     reports that she has never smoked. She does not have any smokeless tobacco history on file. She reports that she drinks alcohol. Her drug history not on file. family history includes Cancer in her others; Diabetes in her father; Hypertension in her father and mother; Melanoma in her other; Stroke in her other; and Ulcers in her  mother. Allergies  Allergen Reactions  . Codeine Nausea And Vomiting  . Hydrocodone Nausea And Vomiting  . Venlafaxine    Current Outpatient Prescriptions on File Prior to Visit  Medication Sig Dispense Refill  . aspirin 81 MG EC tablet Take 81 mg by mouth daily.        . benazepril-hydrochlorthiazide (LOTENSIN HCT) 20-12.5 MG per tablet TAKE TWO TABLETS BY MOUTH EVERY DAY.  60 tablet  6  . lovastatin (MEVACOR) 20 MG tablet Take 1 tablet (20 mg total) by mouth daily.  90 tablet  3  . DISCONTD: benazepril-hydrochlorthiazide (LOTENSIN HCT) 20-12.5 MG per tablet Take 1 tablet by mouth daily.  90 tablet  11   Review of Systems Review of Systems  Constitutional: Negative for diaphoresis, activity change, appetite change and unexpected weight change.  HENT: Negative for hearing loss, ear pain, facial swelling, mouth sores and neck stiffness.   Eyes: Negative for pain, redness and visual disturbance.  Respiratory: Negative for shortness of breath and wheezing.   Cardiovascular: Negative for chest pain and palpitations.  Gastrointestinal: Negative for diarrhea, blood in stool, abdominal distention and rectal pain.  Genitourinary: Negative for hematuria, flank pain and decreased urine volume.  Musculoskeletal: Negative for myalgias and joint swelling.  Skin: Negative for color change and wound.  Neurological: Negative for syncope and numbness.  Hematological: Negative for adenopathy.  Psychiatric/Behavioral: Negative for hallucinations, self-injury, decreased concentration and agitation.      Objective:   Physical Exam BP 110/78  Pulse 69  Temp(Src) 98.3 F (36.8 C) (Oral)  Ht 5\' 5"  (1.651 m)  Wt 141 lb 4 oz (64.071 kg)  BMI 23.51 kg/m2  SpO2 97% Physical Exam  VS noted Constitutional: Pt is oriented to person, place, and time. Appears well-developed and well-nourished.  HENT:  Head: Normocephalic and atraumatic.  Right Ear: External ear normal.  Left Ear: External ear normal.    Nose: Nose normal.  Mouth/Throat: Oropharynx is clear and moist.  Eyes: Conjunctivae and EOM are normal. Pupils are equal, round, and reactive to light.  Neck: Normal range of motion. Neck supple. No JVD present. No tracheal deviation present.  Cardiovascular: Normal rate, regular rhythm, normal heart sounds and intact distal pulses.   Pulmonary/Chest: Effort normal and breath sounds normal.  Abdominal: Soft. Bowel sounds are normal. There is no tenderness.  Musculoskeletal: Normal range of motion. Exhibits no edema.  Lymphadenopathy:  Has no cervical adenopathy.  Neurological: Pt is alert and oriented to person, place, and time. Pt has normal reflexes. No cranial nerve deficit.  Skin: Skin is warm and dry. No rash noted.  Psychiatric:  Has  normal mood and affect. Behavior is normal. 1+ nervous    Assessment & Plan:

## 2012-01-14 NOTE — Patient Instructions (Addendum)
Continue all other medications as before Please have the pharmacy call with any refills you may need. Please remember to followup with your mammogram as you do Your next colonoscopy is not due until July 2014 You are otherwise up to date with prevention Please return in 1 year for your yearly visit, or sooner if needed, with Lab testing done 3-5 days before

## 2012-01-14 NOTE — Assessment & Plan Note (Addendum)

## 2012-01-21 ENCOUNTER — Other Ambulatory Visit: Payer: Self-pay | Admitting: Internal Medicine

## 2012-01-22 ENCOUNTER — Ambulatory Visit: Payer: BC Managed Care – PPO | Admitting: Internal Medicine

## 2012-01-24 ENCOUNTER — Encounter: Payer: BC Managed Care – PPO | Admitting: Internal Medicine

## 2012-03-11 ENCOUNTER — Encounter: Payer: Self-pay | Admitting: Internal Medicine

## 2012-04-28 ENCOUNTER — Other Ambulatory Visit: Payer: Self-pay | Admitting: Internal Medicine

## 2012-06-17 ENCOUNTER — Ambulatory Visit (INDEPENDENT_AMBULATORY_CARE_PROVIDER_SITE_OTHER): Payer: BC Managed Care – PPO | Admitting: Family Medicine

## 2012-06-17 ENCOUNTER — Encounter: Payer: Self-pay | Admitting: Family Medicine

## 2012-06-17 VITALS — BP 146/87 | HR 89 | Ht 65.0 in | Wt 137.0 lb

## 2012-06-17 DIAGNOSIS — S86919A Strain of unspecified muscle(s) and tendon(s) at lower leg level, unspecified leg, initial encounter: Secondary | ICD-10-CM

## 2012-06-17 DIAGNOSIS — IMO0002 Reserved for concepts with insufficient information to code with codable children: Secondary | ICD-10-CM

## 2012-06-17 NOTE — Patient Instructions (Addendum)
You have a calf strain (or tennis leg). Compression sleeve as much as possible. Icing for 15 minutes at a time 3-4 times a day for swelling, pain. Heel lifts in comfortable shoes. Crutches if needed. Advil 3 tabs three times a day with food for pain and inflammation. Start heel raise exercise in about a week - 3 sets of 10 with both legs - eventually in a few weeks start doing this only on your right leg. Start motion exercises of ankle (up/downs and alphabet). Cycling for cross training Follow up with me in 1 month or as needed.

## 2012-06-18 ENCOUNTER — Encounter: Payer: Self-pay | Admitting: Family Medicine

## 2012-06-18 DIAGNOSIS — S86919A Strain of unspecified muscle(s) and tendon(s) at lower leg level, unspecified leg, initial encounter: Secondary | ICD-10-CM | POA: Insufficient documentation

## 2012-06-18 NOTE — Progress Notes (Signed)
Subjective:    Patient ID: Barbara Gross, female    DOB: January 07, 1956, 56 y.o.   MRN: [id number]  PCP: Dr. Oliver Barre  HPI 56 yo F here for right leg injury.  Patient reports about 2 weeks ago while walking in the house she felt a pop within right calf muscle. Difficulty walking after this. Started icing, taking advil. Feels like she has pulled it a couple other times since then. No prior issues with right calf, achilles, plantar fascia.  Past Medical History  Diagnosis Date  . HYPERLIPIDEMIA 05/31/2007  . ANXIETY 12/24/2007  . DEPRESSION 12/24/2007  . MIGRAINE HEADACHE 05/31/2007  . HYPERTENSION 05/31/2007  . DIVERTICULITIS OF COLON 04/21/2010  . Microscopic hematuria 01/03/2009  . BREAST MASS, LEFT 07/15/2008  . BACTERIAL VAGINITIS 12/24/2007  . MENOPAUSE, EARLY 01/04/2010  . ABDOMINAL PAIN, LOWER 05/22/2010    Current Outpatient Prescriptions on File Prior to Visit  Medication Sig Dispense Refill  . aspirin 81 MG EC tablet Take 81 mg by mouth daily.        . benazepril-hydrochlorthiazide (LOTENSIN HCT) 20-12.5 MG per tablet TAKE TWO TABLETS BY MOUTH EVERY DAY  60 tablet  10  . lovastatin (MEVACOR) 20 MG tablet TAKE ONE TABLET BY MOUTH EVERY DAY  90 tablet  3    Past Surgical History  Procedure Date  . Abdominal hysterectomy   . Oophorectomy     Allergies  Allergen Reactions  . Codeine Nausea And Vomiting  . Hydrocodone Nausea And Vomiting  . Venlafaxine     History   Social History  . Marital Status: Single    Spouse Name: N/A    Number of Children: N/A  . Years of Education: N/A   Occupational History  . 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 Smoking status: Never Smoker   . Smokeless tobacco: Not on file  . Alcohol Use: Yes  . Drug Use: Not on file  . Sexually Active: Not on file   Other Topics Concern  . Not on file   Social History Narrative  . No narrative on file    Family History  Problem Relation Age of Onset    . Hypertension Mother   . Ulcers Mother     bleeding  . Diabetes Mother   . Hyperlipidemia Mother   . Sudden death Mother   . Diabetes Father   . Hypertension Father   . Hyperlipidemia Father   . Melanoma Other   . Cancer Other     colon  . Stroke Other   . Cancer Other     prostate    BP 146/87  Pulse 89  Ht 5\' 5"  (1.651 m)  Wt 137 lb (62.143 kg)  BMI 22.80 kg/m2  Review of Systems See HPI above.    Objective:   Physical Exam Gen: NAD  R leg: No gross deformity, swelling, bruising. Palpable spasm midportion of calf - tender to palpation in this area.  No other TTP about lower leg. FROM ankle with pain on calf raise reproducing her pain and pain on dorsiflexion. Strength 5/5 all motions - able to do 2 legged calf raise. NVI distally.     Assessment & Plan:  1. Calf strain - patient felt pull in calf muscle while walking around house causing calf strain, partial gastroc tear.  Do not think this is a DVT due to being an acute injury.  Compression sleeve provided and felt more comfortable.  Shown  home rehab program.  Nsaids as needed.  Heel lifts.  See instructions for further.

## 2012-06-18 NOTE — Assessment & Plan Note (Signed)
patient felt pull in calf muscle while walking around house causing calf strain, partial gastroc tear.  Do not think this is a DVT due to being an acute injury.  Compression sleeve provided and felt more comfortable.  Shown home rehab program.  Nsaids as needed.  Heel lifts.  See instructions for further.

## 2012-07-31 ENCOUNTER — Other Ambulatory Visit: Payer: Self-pay

## 2012-07-31 MED ORDER — LOVASTATIN 20 MG PO TABS: 20.0000 mg | ORAL_TABLET | Freq: Every day | ORAL | Status: DC

## 2013-01-15 ENCOUNTER — Other Ambulatory Visit (INDEPENDENT_AMBULATORY_CARE_PROVIDER_SITE_OTHER): Payer: BC Managed Care – PPO

## 2013-01-15 DIAGNOSIS — Z0000 Encounter for general adult medical examination without abnormal findings: Secondary | ICD-10-CM

## 2013-01-15 LAB — CBC WITH DIFFERENTIAL/PLATELET
Basophils Absolute: 0 10*3/uL (ref 0.0–0.1)
Basophils Relative: 0.4 % (ref 0.0–3.0)
Eosinophils Absolute: 0.4 10*3/uL (ref 0.0–0.7)
Eosinophils Relative: 4.8 % (ref 0.0–5.0)
HCT: 41.6 % (ref 36.0–46.0)
Hemoglobin: 14.3 g/dL (ref 12.0–15.0)
Lymphocytes Relative: 43.5 % (ref 12.0–46.0)
Lymphs Abs: 3.2 10*3/uL (ref 0.7–4.0)
MCHC: 34.3 g/dL (ref 30.0–36.0)
MCV: 98.1 fl (ref 78.0–100.0)
Monocytes Absolute: 0.6 10*3/uL (ref 0.1–1.0)
Monocytes Relative: 8.3 % (ref 3.0–12.0)
Neutro Abs: 3.2 10*3/uL (ref 1.4–7.7)
Neutrophils Relative %: 43 % (ref 43.0–77.0)
Platelets: 344 10*3/uL (ref 150.0–400.0)
RBC: 4.24 Mil/uL (ref 3.87–5.11)
RDW: 12.9 % (ref 11.5–14.6)
WBC: 7.4 10*3/uL (ref 4.5–10.5)

## 2013-01-15 LAB — BASIC METABOLIC PANEL
BUN: 16 mg/dL (ref 6–23)
CO2: 30 mEq/L (ref 19–32)
Calcium: 9.6 mg/dL (ref 8.4–10.5)
Chloride: 103 mEq/L (ref 96–112)
Creatinine, Ser: 0.7 mg/dL (ref 0.4–1.2)
GFR: 86.13 mL/min (ref >60.00)
Glucose, Bld: 90 mg/dL (ref 70–99)
Potassium: 4.2 mEq/L (ref 3.5–5.1)
Sodium: 140 mEq/L (ref 135–145)

## 2013-01-15 LAB — URINALYSIS, ROUTINE W REFLEX MICROSCOPIC
Bilirubin Urine: NEGATIVE
Ketones, ur: NEGATIVE
Nitrite: NEGATIVE
Specific Gravity, Urine: 1.02 (ref 1.000–1.030)
Total Protein, Urine: NEGATIVE
Urine Glucose: NEGATIVE
Urobilinogen, UA: 0.2 (ref 0.0–1.0)
pH: 6 (ref 5.0–8.0)

## 2013-01-15 LAB — LIPID PANEL
Cholesterol: 176 mg/dL (ref 0–200)
HDL: 61.5 mg/dL (ref >39.00)
LDL Cholesterol: 91 mg/dL (ref 0–99)
Total CHOL/HDL Ratio: 3
Triglycerides: 120 mg/dL (ref 0.0–149.0)
VLDL: 24 mg/dL (ref 0.0–40.0)

## 2013-01-15 LAB — HEPATIC FUNCTION PANEL
ALT: 25 U/L (ref 0–35)
AST: 22 U/L (ref 0–37)
Albumin: 4.4 g/dL (ref 3.5–5.2)
Alkaline Phosphatase: 54 U/L (ref 39–117)
Bilirubin, Direct: 0 mg/dL (ref 0.0–0.3)
Total Bilirubin: 0.7 mg/dL (ref 0.3–1.2)
Total Protein: 7.4 g/dL (ref 6.0–8.3)

## 2013-01-15 LAB — TSH: TSH: 0.17 u[IU]/mL — ABNORMAL LOW (ref 0.35–5.50)

## 2013-01-22 ENCOUNTER — Ambulatory Visit: Payer: BC Managed Care – PPO

## 2013-01-22 ENCOUNTER — Encounter: Payer: Self-pay | Admitting: Internal Medicine

## 2013-01-22 ENCOUNTER — Ambulatory Visit (INDEPENDENT_AMBULATORY_CARE_PROVIDER_SITE_OTHER): Payer: BC Managed Care – PPO | Admitting: Internal Medicine

## 2013-01-22 VITALS — BP 110/80 | HR 82 | Temp 98.0°F | Ht 65.0 in | Wt 141.0 lb

## 2013-01-22 DIAGNOSIS — I7300 Raynaud's syndrome without gangrene: Secondary | ICD-10-CM | POA: Insufficient documentation

## 2013-01-22 DIAGNOSIS — R6889 Other general symptoms and signs: Secondary | ICD-10-CM

## 2013-01-22 DIAGNOSIS — Z0000 Encounter for general adult medical examination without abnormal findings: Secondary | ICD-10-CM

## 2013-01-22 DIAGNOSIS — R7989 Other specified abnormal findings of blood chemistry: Secondary | ICD-10-CM

## 2013-01-22 DIAGNOSIS — I10 Essential (primary) hypertension: Secondary | ICD-10-CM

## 2013-01-22 LAB — HEPATIC FUNCTION PANEL
ALT: 23 U/L (ref 0–35)
AST: 21 U/L (ref 0–37)
Albumin: 4.2 g/dL (ref 3.5–5.2)
Alkaline Phosphatase: 55 U/L (ref 39–117)
Bilirubin, Direct: 0 mg/dL (ref 0.0–0.3)
Total Bilirubin: 0.2 mg/dL — ABNORMAL LOW (ref 0.3–1.2)
Total Protein: 7.2 g/dL (ref 6.0–8.3)

## 2013-01-22 LAB — LIPID PANEL
Cholesterol: 175 mg/dL (ref 0–200)
HDL: 63.9 mg/dL (ref >39.00)
LDL Cholesterol: 75 mg/dL (ref 0–99)
Total CHOL/HDL Ratio: 3
Triglycerides: 179 mg/dL — ABNORMAL HIGH (ref 0.0–149.0)
VLDL: 35.8 mg/dL (ref 0.0–40.0)

## 2013-01-22 LAB — BASIC METABOLIC PANEL
BUN: 18 mg/dL (ref 6–23)
CO2: 31 mEq/L (ref 19–32)
Calcium: 9.4 mg/dL (ref 8.4–10.5)
Chloride: 99 mEq/L (ref 96–112)
Creatinine, Ser: 0.7 mg/dL (ref 0.4–1.2)
GFR: 93.36 mL/min (ref >60.00)
Glucose, Bld: 96 mg/dL (ref 70–99)
Potassium: 3.6 mEq/L (ref 3.5–5.1)
Sodium: 137 mEq/L (ref 135–145)

## 2013-01-22 LAB — CBC WITH DIFFERENTIAL/PLATELET
Basophils Absolute: 0 10*3/uL (ref 0.0–0.1)
Basophils Relative: 0.4 % (ref 0.0–3.0)
Eosinophils Absolute: 0.4 10*3/uL (ref 0.0–0.7)
Eosinophils Relative: 3.9 % (ref 0.0–5.0)
HCT: 40.4 % (ref 36.0–46.0)
Hemoglobin: 13.8 g/dL (ref 12.0–15.0)
Lymphocytes Relative: 39.2 % (ref 12.0–46.0)
Lymphs Abs: 3.5 10*3/uL (ref 0.7–4.0)
MCHC: 34.3 g/dL (ref 30.0–36.0)
MCV: 98.3 fl (ref 78.0–100.0)
Monocytes Absolute: 0.9 10*3/uL (ref 0.1–1.0)
Monocytes Relative: 9.7 % (ref 3.0–12.0)
Neutro Abs: 4.2 10*3/uL (ref 1.4–7.7)
Neutrophils Relative %: 46.8 % (ref 43.0–77.0)
Platelets: 305 10*3/uL (ref 150.0–400.0)
RBC: 4.11 Mil/uL (ref 3.87–5.11)
RDW: 13 % (ref 11.5–14.6)
WBC: 9 10*3/uL (ref 4.5–10.5)

## 2013-01-22 LAB — URINALYSIS, ROUTINE W REFLEX MICROSCOPIC
Bilirubin Urine: NEGATIVE
Ketones, ur: NEGATIVE
Leukocytes, UA: NEGATIVE
Nitrite: NEGATIVE
Specific Gravity, Urine: 1.02 (ref 1.000–1.030)
Total Protein, Urine: NEGATIVE
Urine Glucose: NEGATIVE
Urobilinogen, UA: 0.2 (ref 0.0–1.0)
pH: 6 (ref 5.0–8.0)

## 2013-01-22 LAB — TSH: TSH: 0.65 u[IU]/mL (ref 0.35–5.50)

## 2013-01-22 LAB — T4, FREE: Free T4: 0.77 ng/dL (ref 0.60–1.60)

## 2013-01-22 MED ORDER — LOVASTATIN 20 MG PO TABS: 20.0000 mg | ORAL_TABLET | Freq: Every day | ORAL | Status: DC

## 2013-01-22 MED ORDER — BENAZEPRIL HCL 10 MG PO TABS: 10.0000 mg | ORAL_TABLET | Freq: Every day | ORAL | Status: DC

## 2013-01-22 MED ORDER — BENAZEPRIL-HYDROCHLOROTHIAZIDE 20-12.5 MG PO TABS: 1.0000 | ORAL_TABLET | Freq: Two times a day (BID) | ORAL | Status: DC

## 2013-01-22 MED ORDER — AMLODIPINE BESYLATE 5 MG PO TABS: 5.0000 mg | ORAL_TABLET | Freq: Every day | ORAL | Status: DC

## 2013-01-22 NOTE — Patient Instructions (Addendum)
OK to stop the benazapril-HCT (Blood Pressure pill) Please take all new medication as prescribed - the benazepril 10 mg and amlodipine 5 mg per day (two separate prescriptions as the combination pill was too expensive on your insurance) Please continue all other medications as before, and refills have been done if requested - the lovastatin Please go to the LAB in the Basement (turn left off the elevator) for the tests to be done today - the repeat thyroid tests You will be contacted regarding the referral for: colonoscopy Please continue your efforts at being more active, low cholesterol diet, and weight control. You are otherwise up to date with prevention measures today. You will be contacted by phone if any changes need to be made immediately.  Otherwise, you will receive a letter about your results with an explanation, but please check with MyChart first. Thank you for enrolling in MyChart. Please follow the instructions below to securely access your online medical record. MyChart allows you to send messages to your doctor, view your test results, renew your prescriptions, schedule appointments, and more. To Log into My Chart online, please go by [employer] or [employer] to [email or web address], or download the MyChart App from the [employer] .  Your Username is: [email or web address] (pass topcat) Please return in 1 year for your yearly visit, or sooner if needed, with Lab testing done 3-5 days before

## 2013-01-22 NOTE — Assessment & Plan Note (Signed)
To decrease the ACE and d/c the hct when adding the amlodipine,  to f/u any worsening symptoms or concerns

## 2013-01-22 NOTE — Assessment & Plan Note (Signed)
Incidental finding, will re-check labs, consder endo referral if confirmed

## 2013-01-22 NOTE — Progress Notes (Signed)
Subjective:    Patient ID: Barbara Gross, female    DOB: 03/13/1956, 57 y.o.   MRN: [id number]  HPI  Here for wellness and f/u;  Overall doing ok;  Pt denies CP, worsening SOB, DOE, wheezing, orthopnea, PND, worsening LE edema, palpitations, dizziness or syncope.  Pt denies neurological change such as new headache, facial or extremity weakness.  Pt denies polydipsia, polyuria, or low sugar symptoms. Pt states overall good compliance with treatment and medications, good tolerability, and has been trying to follow lower cholesterol diet.  Pt denies worsening depressive symptoms, suicidal ideation or panic. No fever, night sweats, wt loss, loss of appetite, or other constitutional symptoms.  Pt states good ability with ADL's, has low fall risk, home safety reviewed and adequate, no other significant changes in hearing or vision, and only occasionally active with exercise.  Denies hyper or hypo thyroid symptoms such as voice, skin or hair change. Due f/u colonoscopy July 2014.  Reports raynauds seems worse in freq and severity over the last few cold months, doesnot want to do that again next winter Past Medical History  Diagnosis Date  . HYPERLIPIDEMIA 05/31/2007  . ANXIETY 12/24/2007  . DEPRESSION 12/24/2007  . MIGRAINE HEADACHE 05/31/2007  . HYPERTENSION 05/31/2007  . DIVERTICULITIS OF COLON 04/21/2010  . Microscopic hematuria 01/03/2009  . BREAST MASS, LEFT 07/15/2008  . BACTERIAL VAGINITIS 12/24/2007  . MENOPAUSE, EARLY 01/04/2010  . ABDOMINAL PAIN, LOWER 05/22/2010   Past Surgical History  Procedure Laterality Date  . Abdominal hysterectomy    . Oophorectomy      reports that she has never smoked. She does not have any smokeless tobacco history on file. She reports that  drinks alcohol. Her drug history is not on file. family history includes Cancer in her others; Diabetes in her father and mother; Hyperlipidemia in her father and mother; Hypertension in her father and mother; Melanoma in her  other; Stroke in her other; Sudden death in her mother; and Ulcers in her mother. Allergies  Allergen Reactions  . Codeine Nausea And Vomiting  . Hydrocodone Nausea And Vomiting  . Venlafaxine    Current Outpatient Prescriptions on File Prior to Visit  Medication Sig Dispense Refill  . aspirin 81 MG EC tablet Take 81 mg by mouth daily.         No current facility-administered medications on file prior to visit.   Review of Systems Constitutional: Negative for diaphoresis, activity change, appetite change or unexpected weight change.  HENT: Negative for hearing loss, ear pain, facial swelling, mouth sores and neck stiffness.   Eyes: Negative for pain, redness and visual disturbance.  Respiratory: Negative for shortness of breath and wheezing.   Cardiovascular: Negative for chest pain and palpitations.  Gastrointestinal: Negative for diarrhea, blood in stool, abdominal distention or other pain Genitourinary: Negative for hematuria, flank pain or change in urine volume.  Musculoskeletal: Negative for myalgias and joint swelling.  Skin: Negative for color change and wound.  Neurological: Negative for syncope and numbness. other than noted Hematological: Negative for adenopathy.  Psychiatric/Behavioral: Negative for hallucinations, self-injury, decreased concentration and agitation.      Objective:   Physical Exam BP 110/80  Pulse 82  Temp(Src) 98 F (36.7 C) (Oral)  Ht 5\' 5"  (1.651 m)  Wt 141 lb (63.957 kg)  BMI 23.46 kg/m2  SpO2 98% VS noted,  Constitutional: Pt is oriented to person, place, and time. Appears well-developed and well-nourished.  Head: Normocephalic and atraumatic.  Right Ear: External ear normal.  Left Ear: External ear normal.  Nose: Nose normal.  Mouth/Throat: Oropharynx is clear and moist.  Eyes: Conjunctivae and EOM are normal. Pupils are equal, round, and reactive to light.  Neck: Normal range of motion. Neck supple. No JVD present. No tracheal  deviation present.  Cardiovascular: Normal rate, regular rhythm, normal heart sounds and intact distal pulses.   Pulmonary/Chest: Effort normal and breath sounds normal.  Abdominal: Soft. Bowel sounds are normal. There is no tenderness. No HSM  Musculoskeletal: Normal range of motion. Exhibits no edema.  Lymphadenopathy:  Has no cervical adenopathy.  Neurological: Pt is alert and oriented to person, place, and time. Pt has normal reflexes. No cranial nerve deficit.  Skin: Skin is warm and dry. No rash noted.  Psychiatric:  Has  normal mood and affect. Behavior is normal.     Assessment & Plan:

## 2013-01-22 NOTE — Assessment & Plan Note (Signed)

## 2013-01-22 NOTE — Assessment & Plan Note (Signed)
To add calcium channel blocker for more freq symtpoms, avoid cold - see HTN below

## 2013-01-23 ENCOUNTER — Telehealth: Payer: Self-pay | Admitting: Internal Medicine

## 2013-01-23 ENCOUNTER — Encounter: Payer: Self-pay | Admitting: Internal Medicine

## 2013-01-23 MED ORDER — AMLODIPINE BESYLATE 5 MG PO TABS: 5.0000 mg | ORAL_TABLET | Freq: Every day | ORAL | Status: DC

## 2013-01-23 MED ORDER — BENAZEPRIL HCL 10 MG PO TABS: 10.0000 mg | ORAL_TABLET | Freq: Every day | ORAL | Status: DC

## 2013-01-23 NOTE — Telephone Encounter (Signed)
Would she consider going to [facility] -   Amlodipine is $5 per months, and benzapril (I think) is $4 per month  Ok to re-send rx per robin if this is ok with pt  It would be very difficult to find less expensive meds than these 2

## 2013-01-23 NOTE — Telephone Encounter (Signed)
Patients blood pressure medication was changed at her last visit but the new medication is too expensive, patient requesting to have previous medication called to her pharmacy

## 2013-01-23 NOTE — Telephone Encounter (Signed)
Called the patient resent to [facility].  She will call back if still too expensive.

## 2013-01-26 ENCOUNTER — Other Ambulatory Visit: Payer: Self-pay | Admitting: *Deleted

## 2013-01-26 MED ORDER — LOVASTATIN 20 MG PO TABS: 20.0000 mg | ORAL_TABLET | Freq: Every day | ORAL | Status: DC

## 2013-02-06 ENCOUNTER — Encounter: Payer: Self-pay | Admitting: Internal Medicine

## 2013-02-11 ENCOUNTER — Encounter: Payer: Self-pay | Admitting: Internal Medicine

## 2013-03-02 LAB — HM MAMMOGRAPHY

## 2013-03-06 ENCOUNTER — Encounter: Payer: Self-pay | Admitting: Internal Medicine

## 2013-03-30 ENCOUNTER — Encounter: Payer: Self-pay | Admitting: Internal Medicine

## 2013-03-30 MED ORDER — IRBESARTAN 75 MG PO TABS: 75.0000 mg | ORAL_TABLET | Freq: Every day | ORAL | Status: DC

## 2013-03-30 NOTE — Telephone Encounter (Signed)
I am not really sure, but just in case we can change the lotensin 10 to avapro 75 mg (the numbers work out about the same as far as effectiveness) , ow she could consider her eye docotor if persists or gets worse, or OV here

## 2013-04-06 ENCOUNTER — Ambulatory Visit (AMBULATORY_SURGERY_CENTER): Payer: BC Managed Care – PPO | Admitting: *Deleted

## 2013-04-06 ENCOUNTER — Encounter: Payer: Self-pay | Admitting: Internal Medicine

## 2013-04-06 VITALS — Ht 65.0 in | Wt 141.6 lb

## 2013-04-06 DIAGNOSIS — Z86010 Personal history of colonic polyps: Secondary | ICD-10-CM

## 2013-04-06 MED ORDER — MOVIPREP 100 G PO SOLR: 1.0000 | Freq: Once | ORAL | Status: DC

## 2013-04-06 NOTE — Progress Notes (Signed)
No egg or soy allergy. ewm Grandmother had colon cancer dx'd in her 34's. ewm No home 02 use. ewm No problems with sedation except post op nausea. No nausea with last colonoscopy. ewm Last colon was at [facility] in [place]. About 5 years ago . Pt with hyperplastic polyp. ewm Pt declined emmi. ewm

## 2013-04-20 ENCOUNTER — Encounter: Payer: Self-pay | Admitting: Internal Medicine

## 2013-04-20 ENCOUNTER — Ambulatory Visit (AMBULATORY_SURGERY_CENTER): Payer: BC Managed Care – PPO | Admitting: Internal Medicine

## 2013-04-20 VITALS — BP 160/95 | HR 65 | Temp 97.9°F | Resp 28 | Ht 65.0 in | Wt 141.0 lb

## 2013-04-20 DIAGNOSIS — D126 Benign neoplasm of colon, unspecified: Secondary | ICD-10-CM

## 2013-04-20 DIAGNOSIS — Z1211 Encounter for screening for malignant neoplasm of colon: Secondary | ICD-10-CM

## 2013-04-20 DIAGNOSIS — Z86010 Personal history of colonic polyps: Secondary | ICD-10-CM

## 2013-04-20 MED ORDER — SODIUM CHLORIDE 0.9 % IV SOLN: 500.0000 mL | INTRAVENOUS | Status: DC

## 2013-04-20 NOTE — Patient Instructions (Addendum)
Discharge instructions given with verbal understanding. Handouts on polyps and diverticulosis given. Hold aspirin and aspirin products for one week. Resume previous medications. YOU HAD AN ENDOSCOPIC PROCEDURE TODAY AT THE [facility]: Refer to the procedure report that was given to you for any specific questions about what was found during the examination.  If the procedure report does not answer your questions, please call your gastroenterologist to clarify.  If you requested that your care partner not be given the details of your procedure findings, then the procedure report has been included in a sealed envelope for you to review at your convenience later.  YOU SHOULD EXPECT: Some feelings of bloating in the abdomen. Passage of more gas than usual.  Walking can help get rid of the air that was put into your GI tract during the procedure and reduce the bloating. If you had a lower endoscopy (such as a colonoscopy or flexible sigmoidoscopy) you may notice spotting of blood in your stool or on the toilet paper. If you underwent a bowel prep for your procedure, then you may not have a normal bowel movement for a few days.  DIET: Your first meal following the procedure should be a light meal and then it is ok to progress to your normal diet.  A half-sandwich or bowl of soup is an example of a good first meal.  Heavy or fried foods are harder to digest and may make you feel nauseous or bloated.  Likewise meals heavy in dairy and vegetables can cause extra gas to form and this can also increase the bloating.  Drink plenty of fluids but you should avoid alcoholic beverages for 24 hours.  ACTIVITY: Your care partner should take you home directly after the procedure.  You should plan to take it easy, moving slowly for the rest of the day.  You can resume normal activity the day after the procedure however you should NOT DRIVE or use heavy machinery for 24 hours (because of the sedation medicines  used during the test).    SYMPTOMS TO REPORT IMMEDIATELY: A gastroenterologist can be reached at any hour.  During normal business hours, 8:30 AM to 5:00 PM Monday through Friday, call [phone].  After hours and on weekends, please call the GI answering service at [phone] who will take a message and have the physician on call contact you.   Following lower endoscopy (colonoscopy or flexible sigmoidoscopy):  Excessive amounts of blood in the stool  Significant tenderness or worsening of abdominal pains  Swelling of the abdomen that is new, acute  Fever of 100F or higher  FOLLOW UP: If any biopsies were taken you will be contacted by phone or by letter within the next 1-3 weeks.  Call your gastroenterologist if you have not heard about the biopsies in 3 weeks.  Our staff will call the home number listed on your records the next business day following your procedure to check on you and address any questions or concerns that you may have at that time regarding the information given to you following your procedure. This is a courtesy call and so if there is no answer at the home number and we have not heard from you through the emergency physician on call, we will assume that you have returned to your regular daily activities without incident.  SIGNATURES/CONFIDENTIALITY: You and/or your care partner have signed paperwork which will be entered into your electronic medical record.  These signatures attest to the fact that  that the information above on your After Visit Summary has been reviewed and is understood.  Full responsibility of the confidentiality of this discharge information lies with you and/or your care-partner.

## 2013-04-20 NOTE — Progress Notes (Signed)
Procedure ends, to recovery awake, report given and VSS. 

## 2013-04-20 NOTE — Progress Notes (Signed)
Pt. Blood pressure lt arm 160/101 and rt. Arm 160/102, pt. Stated I forgot to take my blood pressure medication this morning, room staff made aware.

## 2013-04-20 NOTE — Op Note (Signed)
[facility] [address] [place], [address]   COLONOSCOPY PROCEDURE REPORT  PATIENT: Barbara Gross, Barbara Gross  MR#: [id number] BIRTHDATE: [birth year] , 56  yrs. old GENDER: Female ENDOSCOPIST: Beverley Fiedler, MD REFERRED XB:JYNWG John, M.D. PROCEDURE DATE:  04/20/2013 PROCEDURE:   Colonoscopy with snare polypectomy and Colonoscopy with cold biopsy polypectomy ASA CLASS:   Class II INDICATIONS:average risk screening and Last colonoscopy performed 5 years ago (family hx of colon cancer is grandfather age >1). MEDICATIONS: MAC sedation, administered by CRNA and Propofol (Diprivan) 360 mg IV  DESCRIPTION OF PROCEDURE:   After the risks benefits and alternatives of the procedure were thoroughly explained, informed consent was obtained.  A digital rectal exam revealed no rectal mass.   The LB [id number]-H190 [id number]  endoscope was introduced through the anus and advanced to the cecum, which was identified by both the appendix and ileocecal valve. No adverse events experienced. The quality of the prep was good, using MoviPrep  The instrument was then slowly withdrawn as the colon was fully examined.     COLON FINDINGS: Two sessile polyps measuring 2-4 mm in size were found at the cecum and in the ascending colon.  Polypectomy was performed with cold forceps.  All resections were complete and all polyp tissue was completely retrieved.   A sessile polyp measuring 8 mm in size was found in the ascending colon.  A polypectomy was performed with a cold snare.  The resection was complete and the polyp tissue was completely retrieved.   Three sessile polyps measuring 5 mm in size were found in the sigmoid colon and rectosigmoid colon.  Polypectomy was performed with cold forceps (1) and using cold snare (2).  All resections were complete and all polyp tissue was completely retrieved.   There was moderate diverticulosis noted in the ascending colon, descending colon, and sigmoid  colon with associated luminal narrowing and muscular hypertrophy in the left colon, particular rectosigmoid. Retroflexed views revealed external hemorrhoids. The time to cecum=3 minutes 16 seconds.  Withdrawal time=21 minutes 27 seconds. The scope was withdrawn and the procedure completed.  COMPLICATIONS: There were no complications.  ENDOSCOPIC IMPRESSION: 1.   Two sessile polyps measuring 2-4 mm in size were found at the cecum and in the ascending colon; Polypectomy was performed with cold forceps 2.   Sessile polyp measuring 8 mm in size was found in the ascending colon; polypectomy was performed with a cold snare 3.   Three sessile polyps measuring 5 mm in size were found in the sigmoid colon and rectosigmoid colon; Polypectomy was performed with cold forceps and using cold snare 4.   There was moderate diverticulosis noted in the ascending colon, descending colon, and sigmoid colon  RECOMMENDATIONS: 1.  Await pathology results 2.  Hold aspirin, aspirin products, and anti-inflammatory medication for 1 week. 3.  High fiber diet 4.  Timing of repeat colonoscopy will be determined by pathology findings. 5.  You will receive a letter within 1-2 weeks with the results of your biopsy as well as final recommendations.  Please call my office if you have not received a letter after 3 weeks.   eSigned:  Beverley Fiedler, MD 04/20/2013 10:10 AM   cc: The Patient and Corwin Levins, MD   PATIENT NAME:  Barbara Gross MR#: [id number]

## 2013-04-20 NOTE — Progress Notes (Signed)
Called to room to assist during endoscopic procedure.  Patient ID and intended procedure confirmed with present staff. Received instructions for my participation in the procedure from the performing physician.  

## 2013-04-20 NOTE — Progress Notes (Signed)
Patient did not experience any of the following events: a burn prior to discharge; a fall within the facility; wrong site/side/patient/procedure/implant event; or a hospital transfer or hospital admission upon discharge from the facility. (G8907) Patient did not have preoperative order for IV antibiotic SSI prophylaxis. (G8918)  

## 2013-04-21 ENCOUNTER — Telehealth: Payer: Self-pay | Admitting: *Deleted

## 2013-04-21 NOTE — Telephone Encounter (Signed)
  Follow up Call-  Call back number 04/20/2013  Post procedure Call Back phone  # [phone]  Permission to leave phone message Yes     Patient questions:  Do you have a fever, pain , or abdominal swelling? no Pain Score  0 *  Have you tolerated food without any problems? yes  Have you been able to return to your normal activities? yes  Do you have any questions about your discharge instructions: Diet   no Medications  no Follow up visit  no  Do you have questions or concerns about your Care? no  Actions: * If pain score is 4 or above: No action needed, pain <4.

## 2013-04-26 ENCOUNTER — Encounter: Payer: Self-pay | Admitting: Internal Medicine

## 2013-08-06 ENCOUNTER — Other Ambulatory Visit: Payer: Self-pay

## 2013-10-29 ENCOUNTER — Other Ambulatory Visit: Payer: Self-pay | Admitting: Internal Medicine

## 2013-12-21 ENCOUNTER — Other Ambulatory Visit: Payer: Self-pay | Admitting: Internal Medicine

## 2013-12-22 ENCOUNTER — Telehealth: Payer: Self-pay

## 2013-12-22 DIAGNOSIS — Z0000 Encounter for general adult medical examination without abnormal findings: Secondary | ICD-10-CM

## 2013-12-22 NOTE — Telephone Encounter (Signed)
cpx labs entered  

## 2013-12-26 ENCOUNTER — Other Ambulatory Visit: Payer: Self-pay | Admitting: Internal Medicine

## 2013-12-28 NOTE — Telephone Encounter (Signed)
Refill done.  

## 2014-01-18 ENCOUNTER — Other Ambulatory Visit: Payer: Self-pay | Admitting: Internal Medicine

## 2014-01-18 MED ORDER — AMLODIPINE BESYLATE 5 MG PO TABS: 5.0000 mg | ORAL_TABLET | Freq: Every day | ORAL | Status: DC

## 2014-02-01 ENCOUNTER — Other Ambulatory Visit (INDEPENDENT_AMBULATORY_CARE_PROVIDER_SITE_OTHER): Payer: BC Managed Care – PPO

## 2014-02-01 DIAGNOSIS — Z0000 Encounter for general adult medical examination without abnormal findings: Secondary | ICD-10-CM

## 2014-02-01 LAB — URINALYSIS, ROUTINE W REFLEX MICROSCOPIC
Bilirubin Urine: NEGATIVE
Ketones, ur: NEGATIVE
Nitrite: NEGATIVE
Specific Gravity, Urine: 1.025 (ref 1.000–1.030)
Total Protein, Urine: NEGATIVE
Urine Glucose: NEGATIVE
Urobilinogen, UA: 0.2 (ref 0.0–1.0)
pH: 6 (ref 5.0–8.0)

## 2014-02-01 LAB — LIPID PANEL
Cholesterol: 197 mg/dL (ref 0–200)
HDL: 62.6 mg/dL (ref >39.00)
LDL Cholesterol: 79 mg/dL (ref 0–99)
Total CHOL/HDL Ratio: 3
Triglycerides: 277 mg/dL — ABNORMAL HIGH (ref 0.0–149.0)
VLDL: 55.4 mg/dL — ABNORMAL HIGH (ref 0.0–40.0)

## 2014-02-01 LAB — BASIC METABOLIC PANEL
BUN: 16 mg/dL (ref 6–23)
CO2: 27 mEq/L (ref 19–32)
Calcium: 9.6 mg/dL (ref 8.4–10.5)
Chloride: 102 mEq/L (ref 96–112)
Creatinine, Ser: 0.7 mg/dL (ref 0.4–1.2)
GFR: 93.02 mL/min (ref >60.00)
Glucose, Bld: 91 mg/dL (ref 70–99)
Potassium: 4.1 mEq/L (ref 3.5–5.1)
Sodium: 140 mEq/L (ref 135–145)

## 2014-02-01 LAB — CBC WITH DIFFERENTIAL/PLATELET
Basophils Absolute: 0 10*3/uL (ref 0.0–0.1)
Basophils Relative: 0.4 % (ref 0.0–3.0)
Eosinophils Absolute: 0.4 10*3/uL (ref 0.0–0.7)
Eosinophils Relative: 4.5 % (ref 0.0–5.0)
HCT: 41.9 % (ref 36.0–46.0)
Hemoglobin: 14.3 g/dL (ref 12.0–15.0)
Lymphocytes Relative: 40.8 % (ref 12.0–46.0)
Lymphs Abs: 3.7 10*3/uL (ref 0.7–4.0)
MCHC: 34.1 g/dL (ref 30.0–36.0)
MCV: 98.4 fl (ref 78.0–100.0)
Monocytes Absolute: 0.8 10*3/uL (ref 0.1–1.0)
Monocytes Relative: 8.6 % (ref 3.0–12.0)
Neutro Abs: 4.2 10*3/uL (ref 1.4–7.7)
Neutrophils Relative %: 45.7 % (ref 43.0–77.0)
Platelets: 354 10*3/uL (ref 150.0–400.0)
RBC: 4.26 Mil/uL (ref 3.87–5.11)
RDW: 13 % (ref 11.5–14.6)
WBC: 9.1 10*3/uL (ref 4.5–10.5)

## 2014-02-01 LAB — HEPATIC FUNCTION PANEL
ALT: 23 U/L (ref 0–35)
AST: 24 U/L (ref 0–37)
Albumin: 4.5 g/dL (ref 3.5–5.2)
Alkaline Phosphatase: 62 U/L (ref 39–117)
Bilirubin, Direct: 0 mg/dL (ref 0.0–0.3)
Total Bilirubin: 0.4 mg/dL (ref 0.3–1.2)
Total Protein: 7.6 g/dL (ref 6.0–8.3)

## 2014-02-01 LAB — TSH: TSH: 0.89 u[IU]/mL (ref 0.35–5.50)

## 2014-02-05 ENCOUNTER — Encounter: Payer: Self-pay | Admitting: Internal Medicine

## 2014-02-05 ENCOUNTER — Ambulatory Visit (INDEPENDENT_AMBULATORY_CARE_PROVIDER_SITE_OTHER): Payer: BC Managed Care – PPO | Admitting: Internal Medicine

## 2014-02-05 VITALS — BP 130/80 | HR 76 | Temp 99.0°F | Ht 65.0 in | Wt 144.8 lb

## 2014-02-05 DIAGNOSIS — J309 Allergic rhinitis, unspecified: Secondary | ICD-10-CM | POA: Insufficient documentation

## 2014-02-05 DIAGNOSIS — Z0000 Encounter for general adult medical examination without abnormal findings: Secondary | ICD-10-CM

## 2014-02-05 MED ORDER — METHYLPREDNISOLONE ACETATE 80 MG/ML IJ SUSP
80.0000 mg | Freq: Once | INTRAMUSCULAR | Status: AC
  Administered 2014-02-05: 80 mg via INTRAMUSCULAR

## 2014-02-05 MED ORDER — IRBESARTAN 75 MG PO TABS: 75.0000 mg | ORAL_TABLET | Freq: Every day | ORAL | Status: DC

## 2014-02-05 MED ORDER — BENAZEPRIL HCL 10 MG PO TABS: 10.0000 mg | ORAL_TABLET | Freq: Every day | ORAL | Status: DC

## 2014-02-05 MED ORDER — FLUTICASONE PROPIONATE 50 MCG/ACT NA SUSP: 2.0000 | Freq: Every day | NASAL | Status: DC

## 2014-02-05 MED ORDER — LOVASTATIN 20 MG PO TABS: ORAL_TABLET | ORAL | Status: DC

## 2014-02-05 NOTE — Patient Instructions (Signed)
You had the steroid shot today  Please take all new medication as prescribed - the flonase  You can also take OTC zyrtec as needed  Please continue all other medications as before, and refills have been done if requested.  Please have the pharmacy call with any other refills you may need.  Please continue your efforts at being more active, low cholesterol diet, and weight control.  You are otherwise up to date with prevention measures today.  Your EKG was OK today  Please remember to sign up for MyChart if you have not done so, as this will be important to you in the future with finding out test results, communicating by private email, and scheduling acute appointments online when needed.  Please return in 1 year for your yearly visit, or sooner if needed, with Lab testing done 3-5 days before

## 2014-02-05 NOTE — Assessment & Plan Note (Signed)
Mild seasonal flare, for depomedrol IM, flonase asd,  to f/u any worsening symptoms or concerns

## 2014-02-05 NOTE — Assessment & Plan Note (Signed)

## 2014-02-05 NOTE — Progress Notes (Signed)
Pre visit review using our clinic review tool, if applicable. No additional management support is needed unless otherwise documented below in the visit note. 

## 2014-02-05 NOTE — Progress Notes (Signed)
Subjective:    Patient ID: Barbara Gross, female    DOB: 04-21-1956, 58 y.o.   MRN: [id number]  HPI  Here for wellness and f/u;  Overall doing ok;  Pt denies CP, worsening SOB, DOE, wheezing, orthopnea, PND, worsening LE edema, palpitations, dizziness or syncope.  Pt denies neurological change such as new headache, facial or extremity weakness.  Pt denies polydipsia, polyuria, or low sugar symptoms. Pt states overall good compliance with treatment and medications, good tolerability, and has been trying to follow lower cholesterol diet.  Pt denies worsening depressive symptoms, suicidal ideation or panic. No fever, night sweats, wt loss, loss of appetite, or other constitutional symptoms.  Pt states good ability with ADL's, has low fall risk, home safety reviewed and adequate, no other significant changes in hearing or vision, and only occasionally active with exercise.  Does have several wks ongoing nasal allergy symptoms with clearish congestion, itch and sneezing, without fever, pain, ST, cough, swelling or wheezing.  Past Medical History  Diagnosis Date  . HYPERLIPIDEMIA 05/31/2007  . ANXIETY 12/24/2007  . DEPRESSION 12/24/2007  . MIGRAINE HEADACHE 05/31/2007  . HYPERTENSION 05/31/2007  . DIVERTICULITIS OF COLON 04/21/2010  . Microscopic hematuria 01/03/2009  . BREAST MASS, LEFT 07/15/2008  . BACTERIAL VAGINITIS 12/24/2007  . MENOPAUSE, EARLY 01/04/2010  . ABDOMINAL PAIN, LOWER 05/22/2010   Past Surgical History  Procedure Laterality Date  . Abdominal hysterectomy    . Oophorectomy    . Colonoscopy    . Polypectomy    . Exploratory laparotomy      reports that she has never smoked. She has never used smokeless tobacco. She reports that she drinks about 8.4 ounces of alcohol per week. She reports that she does not use illicit drugs. family history includes Cancer in her other; Colon cancer in her other; Diabetes in her father; Hyperlipidemia in her father and mother; Hypertension in her  father and mother; Melanoma in her other; Stroke in her other; Sudden death in her mother; Ulcers in her mother. Allergies  Allergen Reactions  . Codeine Nausea And Vomiting  . Hydrocodone Nausea And Vomiting  . Venlafaxine Nausea Only   Current Outpatient Prescriptions on File Prior to Visit  Medication Sig Dispense Refill  . amLODipine (NORVASC) 5 MG tablet Take 1 tablet (5 mg total) by mouth daily.  30 tablet  0  . aspirin 81 MG EC tablet Take 81 mg by mouth daily.         No current facility-administered medications on file prior to visit.   Current Outpatient Prescriptions on File Prior to Visit  Medication Sig Dispense Refill  . amLODipine (NORVASC) 5 MG tablet Take 1 tablet (5 mg total) by mouth daily.  30 tablet  0  . aspirin 81 MG EC tablet Take 81 mg by mouth daily.         No current facility-administered medications on file prior to visit.    Review of Systems Constitutional: Negative for increased diaphoresis, other activity, appetite or other siginficant weight change  HENT: Negative for worsening hearing loss, ear pain, facial swelling, mouth sores and neck stiffness.   Eyes: Negative for other worsening pain, redness or visual disturbance.  Respiratory: Negative for shortness of breath and wheezing.   Cardiovascular: Negative for chest pain and palpitations.  Gastrointestinal: Negative for diarrhea, blood in stool, abdominal distention or other pain Genitourinary: Negative for hematuria, flank pain or change in urine volume.  Musculoskeletal: Negative for myalgias or other joint complaints.  Skin: Negative for color change and wound.  Neurological: Negative for syncope and numbness. other than noted Hematological: Negative for adenopathy. or other swelling Psychiatric/Behavioral: Negative for hallucinations, self-injury, decreased concentration or other worsening agitation.      Objective:   Physical Exam BP 130/80  Pulse 76  Temp(Src) 99 F (37.2 C) (Oral)   Ht 5\' 5"  (1.651 m)  Wt 144 lb 12 oz (65.658 kg)  BMI 24.09 kg/m2  SpO2 99% VS noted,  Constitutional: Pt is oriented to person, place, and time. Appears well-developed and well-nourished.  Head: Normocephalic and atraumatic.  Right Ear: External ear normal.  Left Ear: External ear normal.  Nose: Nose normal.  Mouth/Throat: Oropharynx is clear and moist.  Eyes: Conjunctivae and EOM are normal. Pupils are equal, round, and reactive to light.  Neck: Normal range of motion. Neck supple. No JVD present. No tracheal deviation present.  Cardiovascular: Normal rate, regular rhythm, normal heart sounds and intact distal pulses.   Pulmonary/Chest: Effort normal and breath sounds without rales or wheezing  Abdominal: Soft. Bowel sounds are normal. NT. No HSM  Musculoskeletal: Normal range of motion. Exhibits no edema.  Lymphadenopathy:  Has no cervical adenopathy.  Neurological: Pt is alert and oriented to person, place, and time. Pt has normal reflexes. No cranial nerve deficit. Motor grossly intact Skin: Skin is warm and dry. No rash noted.  Psychiatric:  Has mild nervous mood and affect. Behavior is normal.     Assessment & Plan:

## 2014-02-20 ENCOUNTER — Other Ambulatory Visit: Payer: Self-pay | Admitting: Internal Medicine

## 2014-03-18 ENCOUNTER — Encounter: Payer: Self-pay | Admitting: Internal Medicine

## 2014-03-19 ENCOUNTER — Other Ambulatory Visit: Payer: Self-pay | Admitting: Internal Medicine

## 2014-07-16 ENCOUNTER — Other Ambulatory Visit: Payer: Self-pay

## 2014-12-18 ENCOUNTER — Other Ambulatory Visit: Payer: Self-pay | Admitting: Internal Medicine

## 2015-01-15 ENCOUNTER — Other Ambulatory Visit: Payer: Self-pay | Admitting: Internal Medicine

## 2015-02-06 ENCOUNTER — Other Ambulatory Visit: Payer: Self-pay | Admitting: Internal Medicine

## 2015-02-24 ENCOUNTER — Other Ambulatory Visit: Payer: Self-pay | Admitting: Physician Assistant

## 2015-03-15 ENCOUNTER — Other Ambulatory Visit: Payer: Self-pay | Admitting: Internal Medicine

## 2015-03-15 LAB — HM MAMMOGRAPHY: HM Mammogram: NEGATIVE

## 2015-03-31 ENCOUNTER — Other Ambulatory Visit (INDEPENDENT_AMBULATORY_CARE_PROVIDER_SITE_OTHER): Payer: BLUE CROSS/BLUE SHIELD

## 2015-03-31 ENCOUNTER — Telehealth: Payer: Self-pay

## 2015-03-31 DIAGNOSIS — Z0000 Encounter for general adult medical examination without abnormal findings: Secondary | ICD-10-CM

## 2015-03-31 LAB — CBC WITH DIFFERENTIAL/PLATELET
Basophils Absolute: 0 10*3/uL (ref 0.0–0.1)
Basophils Relative: 0.4 % (ref 0.0–3.0)
Eosinophils Absolute: 0.4 10*3/uL (ref 0.0–0.7)
Eosinophils Relative: 5.2 % — ABNORMAL HIGH (ref 0.0–5.0)
HCT: 43.6 % (ref 36.0–46.0)
Hemoglobin: 14.5 g/dL (ref 12.0–15.0)
Lymphocytes Relative: 44.8 % (ref 12.0–46.0)
Lymphs Abs: 3.3 10*3/uL (ref 0.7–4.0)
MCHC: 33.2 g/dL (ref 30.0–36.0)
MCV: 97.8 fl (ref 78.0–100.0)
Monocytes Absolute: 0.7 10*3/uL (ref 0.1–1.0)
Monocytes Relative: 9.6 % (ref 3.0–12.0)
Neutro Abs: 2.9 10*3/uL (ref 1.4–7.7)
Neutrophils Relative %: 40 % — ABNORMAL LOW (ref 43.0–77.0)
Platelets: 320 10*3/uL (ref 150.0–400.0)
RBC: 4.46 Mil/uL (ref 3.87–5.11)
RDW: 13.3 % (ref 11.5–15.5)
WBC: 7.4 10*3/uL (ref 4.0–10.5)

## 2015-03-31 LAB — LIPID PANEL
Cholesterol: 177 mg/dL (ref 0–200)
HDL: 63.6 mg/dL (ref >39.00)
LDL Cholesterol: 93 mg/dL (ref 0–99)
NonHDL: 113.4
Total CHOL/HDL Ratio: 3
Triglycerides: 100 mg/dL (ref 0.0–149.0)
VLDL: 20 mg/dL (ref 0.0–40.0)

## 2015-03-31 LAB — BASIC METABOLIC PANEL
BUN: 14 mg/dL (ref 6–23)
CO2: 31 mEq/L (ref 19–32)
Calcium: 9.9 mg/dL (ref 8.4–10.5)
Chloride: 103 mEq/L (ref 96–112)
Creatinine, Ser: 0.66 mg/dL (ref 0.40–1.20)
GFR: 97.52 mL/min (ref >60.00)
Glucose, Bld: 101 mg/dL — ABNORMAL HIGH (ref 70–99)
Potassium: 4.4 mEq/L (ref 3.5–5.1)
Sodium: 141 mEq/L (ref 135–145)

## 2015-03-31 LAB — URINALYSIS, ROUTINE W REFLEX MICROSCOPIC
Bilirubin Urine: NEGATIVE
Ketones, ur: NEGATIVE
Leukocytes, UA: NEGATIVE
Nitrite: NEGATIVE
Specific Gravity, Urine: 1.015 (ref 1.000–1.030)
Total Protein, Urine: NEGATIVE
Urine Glucose: NEGATIVE
Urobilinogen, UA: 0.2 (ref 0.0–1.0)
pH: 6.5 (ref 5.0–8.0)

## 2015-03-31 LAB — HEPATIC FUNCTION PANEL
ALT: 21 U/L (ref 0–35)
AST: 20 U/L (ref 0–37)
Albumin: 4.4 g/dL (ref 3.5–5.2)
Alkaline Phosphatase: 67 U/L (ref 39–117)
Bilirubin, Direct: 0.1 mg/dL (ref 0.0–0.3)
Total Bilirubin: 0.5 mg/dL (ref 0.2–1.2)
Total Protein: 7.7 g/dL (ref 6.0–8.3)

## 2015-03-31 LAB — TSH: TSH: 0.92 u[IU]/mL (ref 0.35–4.50)

## 2015-03-31 NOTE — Telephone Encounter (Signed)
Lab called requesting CPE orders for pt's upcoming appt

## 2015-04-06 ENCOUNTER — Ambulatory Visit (INDEPENDENT_AMBULATORY_CARE_PROVIDER_SITE_OTHER): Payer: BLUE CROSS/BLUE SHIELD | Admitting: Internal Medicine

## 2015-04-06 ENCOUNTER — Encounter: Payer: Self-pay | Admitting: Internal Medicine

## 2015-04-06 ENCOUNTER — Telehealth: Payer: Self-pay | Admitting: Internal Medicine

## 2015-04-06 VITALS — BP 128/82 | HR 80 | Temp 98.3°F | Ht 64.0 in | Wt 144.0 lb

## 2015-04-06 DIAGNOSIS — I7300 Raynaud's syndrome without gangrene: Secondary | ICD-10-CM

## 2015-04-06 DIAGNOSIS — M25511 Pain in right shoulder: Secondary | ICD-10-CM | POA: Diagnosis not present

## 2015-04-06 DIAGNOSIS — Z0000 Encounter for general adult medical examination without abnormal findings: Secondary | ICD-10-CM | POA: Diagnosis not present

## 2015-04-06 DIAGNOSIS — M25512 Pain in left shoulder: Secondary | ICD-10-CM

## 2015-04-06 DIAGNOSIS — I10 Essential (primary) hypertension: Secondary | ICD-10-CM

## 2015-04-06 MED ORDER — AMLODIPINE BESYLATE 5 MG PO TABS: 5.0000 mg | ORAL_TABLET | Freq: Every day | ORAL | Status: DC

## 2015-04-06 MED ORDER — LOVASTATIN 20 MG PO TABS: ORAL_TABLET | ORAL | Status: DC

## 2015-04-06 MED ORDER — BENAZEPRIL HCL 10 MG PO TABS: 10.0000 mg | ORAL_TABLET | Freq: Every day | ORAL | Status: DC

## 2015-04-06 MED ORDER — MELOXICAM 15 MG PO TABS: 15.0000 mg | ORAL_TABLET | Freq: Every day | ORAL | Status: DC

## 2015-04-06 MED ORDER — IRBESARTAN 75 MG PO TABS: 75.0000 mg | ORAL_TABLET | Freq: Every day | ORAL | Status: DC

## 2015-04-06 NOTE — Progress Notes (Signed)
Pre visit review using our clinic review tool, if applicable. No additional management support is needed unless otherwise documented below in the visit note. 

## 2015-04-06 NOTE — Assessment & Plan Note (Signed)

## 2015-04-06 NOTE — Telephone Encounter (Signed)
Pt advised to take Benazepril and Norvasc

## 2015-04-06 NOTE — Patient Instructions (Signed)
Please take all new medication as prescribed - the anti-inflammatory  Please continue all other medications as before, and refills have been done if requested.  Please have the pharmacy call with any other refills you may need.  Please continue your efforts at being more active, low cholesterol diet, and weight control.  You are otherwise up to date with prevention measures today.  Please keep your appointments with your specialists as you may have planned  You will be contacted regarding the referral for: Dr Tamala Julian- Sports Medicine  Please return in 1 year for your yearly visit, or sooner if needed, with Lab testing done 3-5 days before

## 2015-04-06 NOTE — Telephone Encounter (Signed)
Patient has some questions regarding benazepril that was prescribed for her today. She's not sure if this is to replace her other bp medicine Please call her at [phone]

## 2015-04-06 NOTE — Assessment & Plan Note (Signed)
Symptomatic today but not often enough to consider tx, pt declines further rx

## 2015-04-06 NOTE — Assessment & Plan Note (Signed)
stable overall by history and exam, recent data reviewed with pt, and pt to continue medical treatment as before,  to f/u any worsening symptoms or concerns BP Readings from Last 3 Encounters:  04/06/15 128/82  02/05/14 130/80  04/20/13 160/95

## 2015-04-06 NOTE — Progress Notes (Signed)
Subjective:    Patient ID: Barbara Gross, female    DOB: 1955/12/04, 59 y.o.   MRN: [id number]  HPI  Here for wellness and f/u;  Overall doing ok;  Pt denies Chest pain, worsening SOB, DOE, wheezing, orthopnea, PND, worsening LE edema, palpitations, dizziness or syncope.  Pt denies neurological change such as new headache, facial or extremity weakness.  Pt denies polydipsia, polyuria, or low sugar symptoms. Pt states overall good compliance with treatment and medications, good tolerability, and has been trying to follow appropriate diet.  Pt denies worsening depressive symptoms, suicidal ideation or panic. No fever, night sweats, wt loss, loss of appetite, or other constitutional symptoms.  Pt states good ability with ADL's, has low fall risk, home safety reviewed and adequate, no other significant changes in hearing or vision, and only occasionally active with exercise.  Does have left shoulder pain > right shoulder for 1 yr, worse on the left for 2 mo, with popping, and pain radiating to elbow, sometimes seems to be coming from the left neck?  But pain primiarly from the shoulder with popping with dressing, worse to forward elevation and worse with stiffness first in the am,  advil helps somewhat, no trauma, sleep on stomach with arms overhead for years but cant do this now, sleeping worse now.  Unable to get o2 sat today at intake due to active raynauds, now resolved as I get to exam room, pt states not symptomatic enough to consider further med such as procardia Past Medical History  Diagnosis Date  . HYPERLIPIDEMIA 05/31/2007  . ANXIETY 12/24/2007  . DEPRESSION 12/24/2007  . MIGRAINE HEADACHE 05/31/2007  . HYPERTENSION 05/31/2007  . DIVERTICULITIS OF COLON 04/21/2010  . Microscopic hematuria 01/03/2009  . BREAST MASS, LEFT 07/15/2008  . BACTERIAL VAGINITIS 12/24/2007  . MENOPAUSE, EARLY 01/04/2010  . ABDOMINAL PAIN, LOWER 05/22/2010   Past Surgical History  Procedure Laterality Date  . Abdominal  hysterectomy    . Oophorectomy    . Colonoscopy    . Polypectomy    . Exploratory laparotomy      reports that she has never smoked. She has never used smokeless tobacco. She reports that she drinks about 8.4 oz of alcohol per week. She reports that she does not use illicit drugs. family history includes Cancer in her other; Colon cancer in her other; Diabetes in her father; Hyperlipidemia in her father and mother; Hypertension in her father and mother; Melanoma in her other; Stroke in her other; Sudden death in her mother; Ulcers in her mother. Allergies  Allergen Reactions  . Codeine Nausea And Vomiting  . Hydrocodone Nausea And Vomiting  . Venlafaxine Nausea Only    Review of Systems Constitutional: Negative for increased diaphoresis, other activity, appetite or siginficant weight change other than noted HENT: Negative for worsening hearing loss, ear pain, facial swelling, mouth sores and neck stiffness.   Eyes: Negative for other worsening pain, redness or visual disturbance.  Respiratory: Negative for shortness of breath and wheezing  Cardiovascular: Negative for chest pain and palpitations.  Gastrointestinal: Negative for diarrhea, blood in stool, abdominal distention or other pain Genitourinary: Negative for hematuria, flank pain or change in urine volume.  Musculoskeletal: Negative for myalgias or other joint complaints.  Skin: Negative for color change and wound or drainage.  Neurological: Negative for syncope and numbness. other than noted Hematological: Negative for adenopathy. or other swelling Psychiatric/Behavioral: Negative for hallucinations, SI, self-injury, decreased concentration or other worsening agitation.  Objective:   Physical Exam BP 128/82 mmHg  Pulse 80  Temp(Src) 98.3 F (36.8 C) (Oral)  Ht 5\' 4"  (1.626 m)  Wt 144 lb (65.318 kg)  BMI 24.71 kg/m2 VS noted,  Constitutional: Pt is oriented to person, place, and time. Appears well-developed and  well-nourished, in no significant distress Head: Normocephalic and atraumatic.  Right Ear: External ear normal.  Left Ear: External ear normal.  Nose: Nose normal.  Mouth/Throat: Oropharynx is clear and moist.  Eyes: Conjunctivae and EOM are normal. Pupils are equal, round, and reactive to light.  Neck: Normal range of motion. Neck supple. No JVD present. No tracheal deviation present or significant neck LA or mass Cardiovascular: Normal rate, regular rhythm, normal heart sounds and intact distal pulses.   Pulmonary/Chest: Effort normal and breath sounds without rales or wheezing  Abdominal: Soft. Bowel sounds are normal. NT. No HSM  Musculoskeletal: Normal range of motion. Exhibits no edema.  Lymphadenopathy:  Has no cervical adenopathy.  Neurological: Pt is alert and oriented to person, place, and time. Pt has normal reflexes. No cranial nerve deficit. Motor grossly intact Skin: Skin is warm and dry. No rash noted.  Psychiatric:  Has normal mood and affect. Behavior is normal.  Has mod tender to subacromial area, worse to abduct to 100 degrees only, right shoulder with similar location of pain but with FROM    Assessment & Plan:

## 2015-04-06 NOTE — Assessment & Plan Note (Signed)
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

## 2015-04-07 NOTE — Addendum Note (Signed)
Addended by: Lyman Bishop on: 04/07/2015 11:27 AM   Modules accepted: Orders, Medications

## 2015-04-07 NOTE — Telephone Encounter (Signed)
Can you please call patient back regarding the notes below. She still has another question

## 2015-04-07 NOTE — Telephone Encounter (Signed)
Pt is Rx'd Benazepril, Norvasc and Avapro. Please advise, which medication should pt be taking for BP?

## 2015-04-07 NOTE — Telephone Encounter (Signed)
Pt advised.

## 2015-04-07 NOTE — Telephone Encounter (Signed)
To stop the benazepril, thanks

## 2015-04-15 ENCOUNTER — Encounter: Payer: Self-pay | Admitting: Family Medicine

## 2015-04-15 ENCOUNTER — Other Ambulatory Visit (INDEPENDENT_AMBULATORY_CARE_PROVIDER_SITE_OTHER): Payer: BLUE CROSS/BLUE SHIELD

## 2015-04-15 ENCOUNTER — Ambulatory Visit (INDEPENDENT_AMBULATORY_CARE_PROVIDER_SITE_OTHER): Payer: BLUE CROSS/BLUE SHIELD | Admitting: Family Medicine

## 2015-04-15 VITALS — BP 130/86 | HR 78 | Ht 64.0 in | Wt 146.0 lb

## 2015-04-15 DIAGNOSIS — M75102 Unspecified rotator cuff tear or rupture of left shoulder, not specified as traumatic: Secondary | ICD-10-CM | POA: Diagnosis not present

## 2015-04-15 DIAGNOSIS — M25512 Pain in left shoulder: Secondary | ICD-10-CM | POA: Diagnosis not present

## 2015-04-15 NOTE — Progress Notes (Signed)
Pre visit review using our clinic review tool, if applicable. No additional management support is needed unless otherwise documented below in the visit note. 

## 2015-04-15 NOTE — Assessment & Plan Note (Signed)
Patient was given an injection today and tolerated the procedure very well. We discussed icing regimen and home exercises. We discussed topical anti-inflammatory's. Patient work with Product/process development scientist today and learn exercises in greater detail. Patient will try these different interventions and come back and see me again in 3-4 weeks for further evaluation.

## 2015-04-15 NOTE — Progress Notes (Signed)
[facility] [address] [place], [place] [address] Phone: [phone] Subjective:    I'm seeing this patient by the request  of:  Cathlean Cower, MD   CC: Bilateral shoulder pain left greater than right  Barbara Gross is a 59 y.o. female coming in with complaint of she has had the pain for proximally one year. Seems to be more of an insidious onset. Seems to be worsening with the constant dull aching sensation that is starting to affect some of her daily activities. States that there can be some mild radiation down the arm but no numbness or weakness. Does not wake her up at night. Denies any weakness or loss of range of motion. Severity of pain is 7 out of 10. Patient sits of a has responded to meloxicam. Patient does have a past mental history significant for some neck pain but she does not think that this is associated.  Past Medical History  Diagnosis Date  . HYPERLIPIDEMIA 05/31/2007  . ANXIETY 12/24/2007  . DEPRESSION 12/24/2007  . MIGRAINE HEADACHE 05/31/2007  . HYPERTENSION 05/31/2007  . DIVERTICULITIS OF COLON 04/21/2010  . Microscopic hematuria 01/03/2009  . BREAST MASS, LEFT 07/15/2008  . BACTERIAL VAGINITIS 12/24/2007  . MENOPAUSE, EARLY 01/04/2010  . ABDOMINAL PAIN, LOWER 05/22/2010   Past Surgical History  Procedure Laterality Date  . Abdominal hysterectomy    . Oophorectomy    . Colonoscopy    . Polypectomy    . Exploratory laparotomy     History  Substance Use Topics  . Smoking status: Never Smoker   . Smokeless tobacco: Never Used  . Alcohol Use: 8.4 oz/week    14 Glasses of wine per week     Comment: 2 glasses of wine a night   Allergies  Allergen Reactions  . Codeine Nausea And Vomiting  . Hydrocodone Nausea And Vomiting  . Venlafaxine Nausea Only   Family History  Problem Relation Age of Onset  . Hypertension Mother   . Ulcers Mother     bleeding  . Hyperlipidemia Mother   . Sudden death Mother     bleeding  ulcer 1995  . Diabetes Father   . Hypertension Father   . Hyperlipidemia Father   . Melanoma Other   . Colon cancer Other     dx'd in 81's  . Stroke Other   . Cancer Other     prostate        Past medical history, social, surgical and family history all reviewed in electronic medical record.   Review of Systems: No headache, visual changes, nausea, vomiting, diarrhea, constipation, dizziness, abdominal pain, skin rash, fevers, chills, night sweats, weight loss, swollen lymph nodes, body aches, joint swelling, muscle aches, chest pain, shortness of breath, mood changes.   Objective Blood pressure 130/86, pulse 78, height 5\' 4"  (1.626 m), weight 146 lb (66.225 kg), SpO2 97 %.  General: No apparent distress alert and oriented x3 mood and affect normal, dressed appropriately.  HEENT: Pupils equal, extraocular movements intact  Respiratory: Patient's speak in full sentences and does not appear short of breath  Cardiovascular: No lower extremity edema, non tender, no erythema  Skin: Warm dry intact with no signs of infection or rash on extremities or on axial skeleton.  Abdomen: Soft nontender  Neuro: Cranial nerves II through XII are intact, neurovascularly intact in all extremities with 2+ DTRs and 2+ pulses.  Lymph: No lymphadenopathy of posterior or anterior cervical chain or axillae  bilaterally.  Gait normal with good balance and coordination.  MSK:  Non tender with full range of motion and good stability and symmetric strength and tone of , elbows, wrist, hip, knee and ankles bilaterally.  Shoulder: left Inspection reveals no abnormalities, atrophy or asymmetry. Palpation is normal with no tenderness over AC joint or bicipital groove. ROM is full in all planes passively. 4-5 strength compared to the contralateral side signs of impingement with positive Neer and Hawkin's tests, but only minorly positive empty can sign. Speeds and Yergason's tests normal. No labral pathology noted  with negative Obrien's, negative clunk and good stability. Normal scapular function observed. No painful arc and no drop arm sign. No apprehension sign  MSK US performed of: left This study was ordered, performed, and interpreted by Charlann Boxer D.O.  Shoulder:   Supraspinatus: Intersubstance tearing noted, Bursal bulge seen with shoulder abduction on impingement view. Infraspinatus:  Mild degenerative changes in intersubstance tear Significant increase in Doppler flow Subscapularis:  Appears normal on long and transverse views. Positive bursa Teres Minor:  Appears normal on long and transverse views. AC joint:  Mild capsule distention Glenohumeral Joint:  Mild arthritis Glenoid Labrum:  Intact without visualized tears. Biceps Tendon:  Appears normal on long and transverse views, no fraying of tendon, tendon located in intertubercular groove, no subluxation with shoulder internal or external rotation.  Impression: Subacromial bursitis with intersubstance tearing of the rotator cuff  Procedure: Real-time Ultrasound Guided Injection of left glenohumeral joint Device: GE Logiq E  Ultrasound guided injection is preferred based studies that show increased duration, increased effect, greater accuracy, decreased procedural pain, increased response rate with ultrasound guided versus blind injection.  Verbal informed consent obtained.  Time-out conducted.  Noted no overlying erythema, induration, or other signs of local infection.  Skin prepped in a sterile fashion.  Local anesthesia: Topical Ethyl chloride.  With sterile technique and under real time ultrasound guidance:  Joint visualized.  23g 1  inch needle inserted posterior approach. Pictures taken for needle placement. Patient did have injection of 2 cc of 1% lidocaine, 2 cc of 0.5% Marcaine, and 1.0 cc of Kenalog 40 mg/dL. Completed without difficulty  Pain immediately resolved suggesting accurate placement of the medication.  Advised to  call if fevers/chills, erythema, induration, drainage, or persistent bleeding.  Images permanently stored and available for review in the ultrasound unit.  Impression: Technically successful ultrasound guided injection.   Procedure note 38937; 15 minutes spent for Therapeutic exercises as stated in above notes.  This included exercises focusing on stretching, strengthening, with significant focus on eccentric aspects.  Shoulder Exercises that included:  Basic scapular stabilization to include adduction and depression of scapula Scaption, focusing on proper movement and good control Internal and External rotation utilizing a theraband, with elbow tucked at side entire time Rows with theraband Proper technique shown and discussed handout in great detail with ATC.  All questions were discussed and answered.       Impression and Recommendations:     This case required medical decision making of moderate complexity.

## 2015-04-15 NOTE — Patient Instructions (Addendum)
Good to see you.  Ice 20 minutes 2 times daily. Usually after activity and before bed. Exercises 3 times a week.  pennsaid pinkie amount topically 2 times daily as needed.  Vitamin D 2000 IU daily Turmeric 500mg  twice daily See me again in 3 weeks.

## 2015-05-06 ENCOUNTER — Ambulatory Visit (INDEPENDENT_AMBULATORY_CARE_PROVIDER_SITE_OTHER): Payer: BLUE CROSS/BLUE SHIELD | Admitting: Family Medicine

## 2015-05-06 ENCOUNTER — Encounter: Payer: Self-pay | Admitting: Family Medicine

## 2015-05-06 VITALS — BP 128/84 | HR 72 | Ht 64.0 in | Wt 146.0 lb

## 2015-05-06 DIAGNOSIS — M75102 Unspecified rotator cuff tear or rupture of left shoulder, not specified as traumatic: Secondary | ICD-10-CM | POA: Diagnosis not present

## 2015-05-06 NOTE — Progress Notes (Signed)
[facility] [place], [place] [address] Phone: [phone] Subjective:    I'm seeing this patient by the request  of:  Cathlean Cower, MD   CC: Left shoulder follow-up  [id number] Barbara Gross is a 59 y.o. female coming in with complaint of she has had the pain for proximally one year. Patient was seen by me 3 weeks ago and was found have some intersubstance tearing of the rotator cuff. Patient elected try a injection as well as conservative therapy including home exercises, icing protocol, as well as topical anti-inflammatory. Patient states that she is 95% better. Denies any numbness or tingling. States that that strength has come back. Denies any nighttime awakening. Able to do all activities of daily living.  Past Medical History  Diagnosis Date  . HYPERLIPIDEMIA 05/31/2007  . ANXIETY 12/24/2007  . DEPRESSION 12/24/2007  . MIGRAINE HEADACHE 05/31/2007  . HYPERTENSION 05/31/2007  . DIVERTICULITIS OF COLON 04/21/2010  . Microscopic hematuria 01/03/2009  . BREAST MASS, LEFT 07/15/2008  . BACTERIAL VAGINITIS 12/24/2007  . MENOPAUSE, EARLY 01/04/2010  . ABDOMINAL PAIN, LOWER 05/22/2010   Past Surgical History  Procedure Laterality Date  . Abdominal hysterectomy    . Oophorectomy    . Colonoscopy    . Polypectomy    . Exploratory laparotomy     History  Substance Use Topics  . Smoking status: Never Smoker   . Smokeless tobacco: Never Used  . Alcohol Use: 8.4 oz/week    14 Glasses of wine per week     Comment: 2 glasses of wine a night   Allergies  Allergen Reactions  . Codeine Nausea And Vomiting  . Hydrocodone Nausea And Vomiting  . Venlafaxine Nausea Only   Family History  Problem Relation Age of Onset  . Hypertension Mother   . Ulcers Mother     bleeding  . Hyperlipidemia Mother   . Sudden death Mother     bleeding ulcer 1995  . Diabetes Father   . Hypertension Father   . Hyperlipidemia Father   . Melanoma Other   .  Colon cancer Other     dx'd in 22's  . Stroke Other   . Cancer Other     prostate        Past medical history, social, surgical and family history all reviewed in electronic medical record.   Review of Systems: No headache, visual changes, nausea, vomiting, diarrhea, constipation, dizziness, abdominal pain, skin rash, fevers, chills, night sweats, weight loss, swollen lymph nodes, body aches, joint swelling, muscle aches, chest pain, shortness of breath, mood changes.   Objective Blood pressure 128/84, pulse 72, height 5\' 4"  (1.626 m), weight 146 lb (66.225 kg).  General: No apparent distress alert and oriented x3 mood and affect normal, dressed appropriately.  HEENT: Pupils equal, extraocular movements intact  Respiratory: Patient's speak in full sentences and does not appear short of breath  Cardiovascular: No lower extremity edema, non tender, no erythema  Skin: Warm dry intact with no signs of infection or rash on extremities or on axial skeleton.  Abdomen: Soft nontender  Neuro: Cranial nerves II through XII are intact, neurovascularly intact in all extremities with 2+ DTRs and 2+ pulses.  Lymph: No lymphadenopathy of posterior or anterior cervical chain or axillae bilaterally.  Gait normal with good balance and coordination.  MSK:  Non tender with full range of motion and good stability and symmetric strength and tone of , elbows, wrist, hip, knee and  ankles bilaterally.  Shoulder: left Inspection reveals no abnormalities, atrophy or asymmetry. Palpation is normal with no tenderness over AC joint or bicipital groove. ROM is full in all planes passively. 5 out of 5 strength and symmetric to contralateral side Minimal impingement sign still positive Speeds and Yergason's tests normal. No labral pathology noted with negative Obrien's, negative clunk and good stability. Normal scapular function observed. No painful arc and no drop arm sign. No apprehension sign Contralateral  shoulder unremarkable   Impression and Recommendations:     This case required medical decision making of moderate complexity.

## 2015-05-06 NOTE — Patient Instructions (Signed)
Good to see you Ice is your friend still at night Exercises 3 times a week for another 6 weeks Pennsaid when you needed See me again in 6 weeks if not perfect

## 2015-05-06 NOTE — Progress Notes (Signed)
Pre visit review using our clinic review tool, if applicable. No additional management support is needed unless otherwise documented below in the visit note. 

## 2015-05-06 NOTE — Assessment & Plan Note (Signed)
Improving on her own at this time. Encourage her to continue conservative therapy including home exercises and icing over the course the next 6 weeks. Patient will follow-up in 6 weeks and we'll likely ultrasound at that time to make sure patient is healed appropriately.

## 2015-06-17 ENCOUNTER — Ambulatory Visit (INDEPENDENT_AMBULATORY_CARE_PROVIDER_SITE_OTHER): Payer: BLUE CROSS/BLUE SHIELD | Admitting: Family Medicine

## 2015-06-17 ENCOUNTER — Other Ambulatory Visit (INDEPENDENT_AMBULATORY_CARE_PROVIDER_SITE_OTHER): Payer: BLUE CROSS/BLUE SHIELD

## 2015-06-17 ENCOUNTER — Encounter: Payer: Self-pay | Admitting: Family Medicine

## 2015-06-17 VITALS — BP 118/82 | HR 86 | Ht 64.0 in | Wt 146.0 lb

## 2015-06-17 DIAGNOSIS — M7522 Bicipital tendinitis, left shoulder: Secondary | ICD-10-CM

## 2015-06-17 DIAGNOSIS — M25512 Pain in left shoulder: Secondary | ICD-10-CM | POA: Diagnosis not present

## 2015-06-17 DIAGNOSIS — M75102 Unspecified rotator cuff tear or rupture of left shoulder, not specified as traumatic: Secondary | ICD-10-CM

## 2015-06-17 NOTE — Assessment & Plan Note (Signed)
It appears the patient does have a bicep tendinitis. Significant hypoechoic changes compared to previous exam. Patient was given an injection today with moderate benefit. I do think the intersubstance tearing of the rotator cuff and still there as well. Patient come back in 3-4 weeks. If continuing to have difficulty we will consider another intra-articular injection as well as referral to formal physical therapy. I would not think advance imaging is warranted but if necessary to rule out a labral tear that would not be seen on ultrasound we may need to consider in the future.

## 2015-06-17 NOTE — Progress Notes (Signed)
[name] [address], [place] [address] Phone: [phone] Subjective:    I'm seeing this patient by the request  of:  Cathlean Cower, MD   CC: Left shoulder follow-up  HAL:[id number] Barbara Gross is a 59 y.o. female coming in with complaint of left shoulder pain. Patient did have some intersubstance tearing but after injection patient was doing a proximal wing 95% better. Patient was injected greater than 2 months ago. Patient states she has not made any improvement since last exam. Patient still states that certain movements seem to give her trouble. Patient actually states that she is only 85% better than the initial visit. Patient states that certain movements can cause pain that seems to be on the anterior as well as deep into the shoulder. No significant radiation down the arm. No significant neck pain that seems to be associated.  Past Medical History  Diagnosis Date  . HYPERLIPIDEMIA 05/31/2007  . ANXIETY 12/24/2007  . DEPRESSION 12/24/2007  . MIGRAINE HEADACHE 05/31/2007  . HYPERTENSION 05/31/2007  . DIVERTICULITIS OF COLON 04/21/2010  . Microscopic hematuria 01/03/2009  . BREAST MASS, LEFT 07/15/2008  . BACTERIAL VAGINITIS 12/24/2007  . MENOPAUSE, EARLY 01/04/2010  . ABDOMINAL PAIN, LOWER 05/22/2010   Past Surgical History  Procedure Laterality Date  . Abdominal hysterectomy    . Oophorectomy    . Colonoscopy    . Polypectomy    . Exploratory laparotomy     Social History  Substance Use Topics  . Smoking status: Never Smoker   . Smokeless tobacco: Never Used  . Alcohol Use: 8.4 oz/week    14 Glasses of wine per week     Comment: 2 glasses of wine a night   Allergies  Allergen Reactions  . Codeine Nausea And Vomiting  . Hydrocodone Nausea And Vomiting  . Venlafaxine Nausea Only   Family History  Problem Relation Age of Onset  . Hypertension Mother   . Ulcers Mother     bleeding  . Hyperlipidemia Mother   . Sudden death  Mother     bleeding ulcer 1995  . Diabetes Father   . Hypertension Father   . Hyperlipidemia Father   . Melanoma Other   . Colon cancer Other     dx'd in 19's  . Stroke Other   . Cancer Other     prostate        Past medical history, social, surgical and family history all reviewed in electronic medical record.   Review of Systems: No headache, visual changes, nausea, vomiting, diarrhea, constipation, dizziness, abdominal pain, skin rash, fevers, chills, night sweats, weight loss, swollen lymph nodes, body aches, joint swelling, muscle aches, chest pain, shortness of breath, mood changes.   Objective Blood pressure 118/82, pulse 86, height 5\' 4"  (1.626 m), weight 146 lb (66.225 kg).  General: No apparent distress alert and oriented x3 mood and affect normal, dressed appropriately.  HEENT: Pupils equal, extraocular movements intact  Respiratory: Patient's speak in full sentences and does not appear short of breath  Cardiovascular: No lower extremity edema, non tender, no erythema  Skin: Warm dry intact with no signs of infection or rash on extremities or on axial skeleton.  Abdomen: Soft nontender  Neuro: Cranial nerves II through XII are intact, neurovascularly intact in all extremities with 2+ DTRs and 2+ pulses.  Lymph: No lymphadenopathy of posterior or anterior cervical chain or axillae bilaterally.  Gait normal with good balance and coordination.  MSK:  Non tender with full range of motion and good stability and symmetric strength and tone of , elbows, wrist, hip, knee and ankles bilaterally.  Shoulder: left Inspection reveals no abnormalities, atrophy or asymmetry. Palpation is normal with no tenderness over AC joint or bicipital groove. ROM is full in all planes passively. 5 out of 5 strength and symmetric to contralateral side Minimal impingement sign still positive Speeds and Yergason's tests positive new finding No labral pathology noted with negative Obrien's,  negative clunk and good stability. Normal scapular function observed. No painful arc and no drop arm sign. No apprehension sign Contralateral shoulder unremarkable  MSK US performed of: Left shoulder This study was ordered, performed, and interpreted by Charlann Boxer D.O.  Shoulder:   Supraspinatus: Intersubstance tearing still not independent decrease hypoechoic changes from previous exam Infraspinatus:  Appears normal on long and transverse views. Subscapularis:  Appears normal on long and transverse views. Teres Minor:  Appears normal on long and transverse views. AC joint:  Mild arthritis Glenohumeral Joint: Mild arthritis. Glenoid Labrum:  Intact without visualized tears. Biceps Tendon:  Hypoechoic changes with positive target sign noted  Procedure: Real-time Ultrasound Guided Injection of left bicep tendon sheath Device: GE Logiq E  Ultrasound guided injection is preferred based studies that show increased duration, increased effect, greater accuracy, decreased procedural pain, increased response rate, and decreased cost with ultrasound guided versus blind injection.  Verbal informed consent obtained.  Time-out conducted.  Noted no overlying erythema, induration, or other signs of local infection.  Skin prepped in a sterile fashion.  Local anesthesia: Topical Ethyl chloride.  With sterile technique and under real time ultrasound guidance:  From a superior approach from proximal to inferior patient was injected with a 25-gauge 1 inch needle with a total of 0.5 mL of 0.5% Marcaine and 0.5 mL of Kenalog 40 mg/dL Completed without difficulty  Pain  Improved  Advised to call if fevers/chills, erythema, induration, drainage, or persistent bleeding.  Images permanently stored and available for review in the ultrasound unit.  Impression: Technically successful ultrasound guided injection.    Impression and Recommendations:     This case required medical decision making of moderate  complexity.

## 2015-06-17 NOTE — Patient Instructions (Signed)
Good to see you.  Ice 20 minutes 2 times daily. Usually after activity and before bed. Compression sleeve from dicks or [employer].  New exercises 3 times a week.  See me again in 3-4 weeks if not perfect and we will inject shoulder again.

## 2015-06-17 NOTE — Assessment & Plan Note (Signed)
Encouraged to continue the exercises. She will be seen again in 3-4 weeks. If worsening symptoms we'll consider injection.

## 2015-07-11 ENCOUNTER — Ambulatory Visit (INDEPENDENT_AMBULATORY_CARE_PROVIDER_SITE_OTHER): Payer: BLUE CROSS/BLUE SHIELD | Admitting: Family Medicine

## 2015-07-11 ENCOUNTER — Encounter: Payer: Self-pay | Admitting: Family Medicine

## 2015-07-11 VITALS — BP 130/84 | HR 84 | Ht 64.0 in | Wt 147.0 lb

## 2015-07-11 DIAGNOSIS — M999 Biomechanical lesion, unspecified: Secondary | ICD-10-CM

## 2015-07-11 DIAGNOSIS — M75102 Unspecified rotator cuff tear or rupture of left shoulder, not specified as traumatic: Secondary | ICD-10-CM | POA: Diagnosis not present

## 2015-07-11 DIAGNOSIS — M9902 Segmental and somatic dysfunction of thoracic region: Secondary | ICD-10-CM

## 2015-07-11 DIAGNOSIS — M9901 Segmental and somatic dysfunction of cervical region: Secondary | ICD-10-CM | POA: Diagnosis not present

## 2015-07-11 DIAGNOSIS — M9908 Segmental and somatic dysfunction of rib cage: Secondary | ICD-10-CM | POA: Diagnosis not present

## 2015-07-11 NOTE — Assessment & Plan Note (Signed)
Decision today to treat with OMT was based on Physical Exam  After verbal consent patient was treated with HVLA, ME, FPR techniques in cervical, rib in thoracic areas  Patient tolerated the procedure well with improvement in symptoms  Patient given exercises, stretches and lifestyle modifications  See medications in patient instructions if given  Patient will follow up in 3-4 weeks

## 2015-07-11 NOTE — Progress Notes (Signed)
Pre visit review using our clinic review tool, if applicable. No additional management support is needed unless otherwise documented below in the visit note. 

## 2015-07-11 NOTE — Progress Notes (Signed)
[name] [place] [place], [place] [address] Phone: [phone] Subjective:    I'm seeing this patient by the request  of:  Cathlean Cower, MD   CC: Left shoulder follow-up  Barbara Gross is a 59 y.o. female coming in with complaint of left shoulder pain. Patient is now having an injection in the shoulder itself for a rotator cuff tear as well as for bicipital tendinitis. Patient states that the last injection in the bicep tendon did not make any significant improvement. Feels like she has plateaued on improving with the shoulder as well. Still given her discomfort overall. Denies any weakness though. States that he can even wake her up at night still. Rates the severity of pain a 6 out of 10.  Past Medical History  Diagnosis Date  . HYPERLIPIDEMIA 05/31/2007  . ANXIETY 12/24/2007  . DEPRESSION 12/24/2007  . MIGRAINE HEADACHE 05/31/2007  . HYPERTENSION 05/31/2007  . DIVERTICULITIS OF COLON 04/21/2010  . Microscopic hematuria 01/03/2009  . BREAST MASS, LEFT 07/15/2008  . BACTERIAL VAGINITIS 12/24/2007  . MENOPAUSE, EARLY 01/04/2010  . ABDOMINAL PAIN, LOWER 05/22/2010   Past Surgical History  Procedure Laterality Date  . Abdominal hysterectomy    . Oophorectomy    . Colonoscopy    . Polypectomy    . Exploratory laparotomy     Social History  Substance Use Topics  . Smoking status: Never Smoker   . Smokeless tobacco: Never Used  . Alcohol Use: 8.4 oz/week    14 Glasses of wine per week     Comment: 2 glasses of wine a night   Allergies  Allergen Reactions  . Codeine Nausea And Vomiting  . Hydrocodone Nausea And Vomiting  . Venlafaxine Nausea Only   Family History  Problem Relation Age of Onset  . Hypertension Mother   . Ulcers Mother     bleeding  . Hyperlipidemia Mother   . Sudden death Mother     bleeding ulcer 1995  . Diabetes Father   . Hypertension Father   . Hyperlipidemia Father   . Melanoma Other   . Colon  cancer Other     dx'd in 36's  . Stroke Other   . Cancer Other     prostate        Past medical history, social, surgical and family history all reviewed in electronic medical record.   Review of Systems: No headache, visual changes, nausea, vomiting, diarrhea, constipation, dizziness, abdominal pain, skin rash, fevers, chills, night sweats, weight loss, swollen lymph nodes, body aches, joint swelling, muscle aches, chest pain, shortness of breath, mood changes.   Objective Blood pressure 130/84, pulse 84, height 5\' 4"  (1.626 m), weight 147 lb (66.679 kg), SpO2 99 %.  General: No apparent distress alert and oriented x3 mood and affect normal, dressed appropriately.  HEENT: Pupils equal, extraocular movements intact  Respiratory: Patient's speak in full sentences and does not appear short of breath  Cardiovascular: No lower extremity edema, non tender, no erythema  Skin: Warm dry intact with no signs of infection or rash on extremities or on axial skeleton.  Abdomen: Soft nontender  Neuro: Cranial nerves II through XII are intact, neurovascularly intact in all extremities with 2+ DTRs and 2+ pulses.  Lymph: No lymphadenopathy of posterior or anterior cervical chain or axillae bilaterally.  Gait normal with good balance and coordination.  MSK:  Non tender with full range of motion and good stability and symmetric strength and tone  of , elbows, wrist, hip, knee and ankles bilaterally.  Shoulder: left Inspection reveals no abnormalities, atrophy or asymmetry. Palpation is normal with no tenderness over AC joint or bicipital groove. ROM is full in all planes passively. 5 out of 5 strength and symmetric to contralateral side Positive impingement signs and worse than previous exam. Speeds and Yergason's tests positive new finding No labral pathology noted with negative Obrien's, negative clunk and good stability. Normal scapular function observed. No painful arc and no drop arm sign. No  apprehension sign Contralateral shoulder unremarkable  Osteopathic findings C6 flexed rotated and side bent left T1 extended rotated and side bent right with elevated first rib   Impression and Recommendations:     This case required medical decision making of moderate complexity.

## 2015-07-11 NOTE — Assessment & Plan Note (Signed)
Continues to have some difficult he overall. I do feel that formal physical therapy would be beneficial. Patient will be referred today. We discussed icing regimen. Patient will continue with topical anti-inflammatory's. Patient did respond fairly well to osteopathic manipulation today and hopefully that this was given her some discomfort as well. Patient will follow-up in 3-4 weeks for further evaluation. Possible imaging of the cervical as well as advanced imaging of the shoulder may be necessary if no significant improvement.

## 2015-07-11 NOTE — Patient Instructions (Signed)
Good to see you Ice is your friend PT will be calling you If manipulation helps we can do this regularly If not better send me a message in 2 weeks. If not better we will consider MRI of the shoulder  Otherwise in 3 weeks we will keep with manipulation.

## 2015-07-22 ENCOUNTER — Encounter: Payer: Self-pay | Admitting: Family Medicine

## 2015-07-26 ENCOUNTER — Ambulatory Visit: Payer: BLUE CROSS/BLUE SHIELD | Attending: Family Medicine

## 2015-07-26 DIAGNOSIS — M25512 Pain in left shoulder: Secondary | ICD-10-CM | POA: Insufficient documentation

## 2015-07-26 DIAGNOSIS — M25612 Stiffness of left shoulder, not elsewhere classified: Secondary | ICD-10-CM

## 2015-07-26 DIAGNOSIS — R29898 Other symptoms and signs involving the musculoskeletal system: Secondary | ICD-10-CM

## 2015-07-26 NOTE — Patient Instructions (Signed)
   Resisted Horizontal Abduction: Bilateral   Sit or stand, tubing in both hands, arms out in front. Keeping arms straight, pinch shoulder blades together and stretch arms out. Repeat _10___ times per set. Do 2____ sets per session. Do _1-2___ sessions per day.   Scapular Retraction: Elbow Flexion (Standing)   With elbows bent to 90, pinch shoulder blades together and rotate arms out, keeping elbows bent. Repeat _10___ times per set. Do _1___ sets per session. Do many____ sessions per day.    Strengthening: Resisted Extension   Hold tubing in right hand, arm forward. Pull arm back, elbow straight. Repeat _10___ times per set. Do _2___ sets per session. Do _1-2___ sessions per day.  Can place band around the front of your body and perform with both arms at the same time.  KEEP HEAD IN NEUTRAL AND SHOULDERS DOWN AND RELAXED     With resistive band anchored in door, grasp both ends. Keeping elbows bent, pull back, squeezing shoulder blades together. Hold _3__ seconds. Repeat _2x10___ times. Do _1-2___ sessions per day.  [email or web address].[email or web address]    Posture - Standing   Good posture is important. Avoid slouching and forward head thrust. Maintain curve in low back and align ears over shoulders, hips over ankles.  Pull your belly button in toward your back bone. Posture Tips DO: - stand tall and erect - keep chin tucked in - keep head and shoulders in alignment - check posture regularly in mirror or large window - pull head back against headrest in car seat;  Change your position often.  Sit with lumbar support. DON'T: - slouch or slump while watching TV or reading - sit, stand or lie in one position  for too long;  Sitting is especially hard on the spine so if you sit at a desk/use the computer, then stand up often! Copyright  VHI. All rights reserved.  Posture - Sitting  Sit upright, head facing forward. Try using a roll to support lower back. Keep shoulders relaxed, and avoid  rounded back. Keep hips level with knees. Avoid crossing legs for long periods. Copyright  VHI. All rights reserved.  Chronic neck strain can develop because of poor posture and faulty work habits  Postural strain related to slumped sitting and forward head posture is a leading cause of headaches, neck and upper back pain  General strengthening and flexibility exercises are helpful in the treatment of neck pain.  Most importantly, you should learn to correct the posture that may be contributing to chronic pain.   Change positions frequently  Change your work or home environment to improve posture and mechanics.   [facility] [address] [place], [place] [address] Phone # [phone] Fax [phone]

## 2015-07-26 NOTE — Therapy (Signed)
[facility] [address], [place], [place], [address] Phone: [phone]   Fax:  [phone]  Physical Therapy Evaluation  Patient Details  Name: Barbara Gross MRN: [id number] Date of Birth: 20-Sep-1956 Referring Provider: Hulan Saas, MD  Encounter Date: 07/26/2015      PT End of Session - 07/26/15 0917    Visit Number 1   Date for PT Re-Evaluation 09/20/15   PT Start Time 0846   PT Stop Time 0916   PT Time Calculation (min) 30 min   Activity Tolerance Patient tolerated treatment well   Behavior During Therapy [facility] for tasks assessed/performed      Past Medical History  Diagnosis Date  . HYPERLIPIDEMIA 05/31/2007  . ANXIETY 12/24/2007  . DEPRESSION 12/24/2007  . MIGRAINE HEADACHE 05/31/2007  . HYPERTENSION 05/31/2007  . DIVERTICULITIS OF COLON 04/21/2010  . Microscopic hematuria 01/03/2009  . BREAST MASS, LEFT 07/15/2008  . BACTERIAL VAGINITIS 12/24/2007  . MENOPAUSE, EARLY 01/04/2010  . ABDOMINAL PAIN, LOWER 05/22/2010    Past Surgical History  Procedure Laterality Date  . Abdominal hysterectomy    . Oophorectomy    . Colonoscopy    . Polypectomy    . Exploratory laparotomy      There were no vitals filed for this visit.  Visit Diagnosis:  Pain in joint of left shoulder - Plan: PT plan of care cert/re-cert  Stiffness of shoulder joint, left - Plan: PT plan of care cert/re-cert  Weakness of shoulder - Plan: PT plan of care cert/re-cert      Subjective Assessment - 07/26/15 0845    Subjective Pt is a Rt hand dominant female who presents to PT with compliaints of Lt shoulder pain that began 03/2015 without incident or injury.  Pt has been seeing sports medicine MD for treatment.     Pertinent History cortizone injection into Lt shoulder 03/2015   Diagnostic tests [place] in MD office- edema noted per pt report   Currently in Pain? Yes   Pain Score 7   0/10 at rest, 7/10 with aggravating activities   Pain Location Shoulder   Pain Orientation Left   Pain Descriptors / Indicators Sore   Pain Type Chronic pain   Pain Onset More than a month ago   Pain Frequency Intermittent   Aggravating Factors  reaching into ER, reaching overhead, use of Lt UE, getting dressed, driving   Pain Relieving Factors stopping the aggravating activity, not moving the arm            [facility] PT Assessment - 07/26/15 0001    Assessment   Medical Diagnosis Lt rotator cuff tear   Referring Provider Hulan Saas, MD   Onset Date/Surgical Date 03/03/15   Hand Dominance Right   Next MD Visit 08/2015   Prior Therapy none   Precautions   Precautions None   Restrictions   Weight Bearing Restrictions No   Balance Screen   Has the patient fallen in the past 6 months No   Has the patient had a decrease in activity level because of a fear of falling?  No   Is the patient reluctant to leave their home because of a fear of falling?  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 Pt reports Lt shoulder pain with flexion, abduction and ER.   PROM   Overall PROM  Within functional limits for tasks performed   Overall PROM Comments Full PROM in Lt shoulder with shoulder pain reported at end range  of each motion   Strength   Overall Strength Deficits   Overall Strength Comments Rt shoulder strength is 5/5   Strength Assessment Site Shoulder   Right/Left Shoulder Left   Left Shoulder Flexion 4/5   Left Shoulder ABduction 4/5   Left Shoulder Internal Rotation 4+/5   Left Shoulder External Rotation 4+/5   Palpation   Palpation comment Pt with palpable tenderness over anterior glenohumeral joint and suprspinatus tendon                           PT Education - 07/26/15 0909    Education provided Yes   Education Details HEP: scapular strength, posture education   Person(s) Educated Patient   Methods Explanation;Demonstration;Handout   Comprehension Verbalized understanding;Returned demonstration          PT Short Term Goals - 07/26/15 0920    PT SHORT TERM GOAL #1   Title be independent in initial HEP   Time 4   Period Weeks   Status New   PT SHORT TERM GOAL #2   Title report a 30% reduction in Lt shoulder pain with overhead use    Time 4   Period Weeks   Status New   PT SHORT TERM GOAL #3   Title perform ER for self-care with < or = to 5/10 Lt shoulder pain   Time 4   Period Weeks   Status New           PT Long Term Goals - 07/26/15 7829    PT LONG TERM GOAL #1   Title be independent in advanced HEP   Time 8   Period Weeks   Status New   PT LONG TERM GOAL #2   Title reduce FOTO to < or = to 34% limitation   Time 8   Period Weeks   Status New   PT LONG TERM GOAL #3   Title report a 60% reduction in Lt shoulder pain with overhead use   Time 8   Period Weeks   Status New   PT LONG TERM GOAL #4   Title demonstrate 4+/5 Lt shoulder strength to improve endurance with use   Time 8   Period Weeks   Status New               Plan - 07/26/15 5621    Clinical Impression Statement Pt is a Rt hand dominant female who presents to PT with complaints of Lt shoulder pain that began 03/2015 without cause.  Pt had cortizone injection at  the MD in June that helped.  Pt has not been performing the exercises issued by MD.  Pt demonstrates painful Lt shoulder AROM, Lt shoulder weakness and rounded shoulder  posture.  Pt will benefit from skilled PT for Lt shoulder strength, postural strength/reeducation, and manual/modalities.     Pt will benefit from skilled therapeutic intervention in order to improve on the following deficits Postural dysfunction;Decreased strength;Pain;Decreased activity tolerance   Rehab Potential Good   PT Frequency 2x / week   PT Duration 8 weeks   PT Treatment/Interventions ADLs/Self Care Home Management;Cryotherapy;Electrical Stimulation;Iontophoresis 4mg /ml Dexamethasone;Moist Heat;Therapeutic exercise;Therapeutic activities;Ultrasound;Neuromuscular re-education;Patient/family education;Manual techniques;Dry needling;Passive range of motion   PT Next Visit Plan review HEP, postural strength, RTC strength, modalities and manual   Consulted and Agree with Plan of Care Patient         Problem List Patient Active Problem List   Diagnosis Date Noted  . Nonallopathic lesion-rib cage 07/11/2015  . Nonallopathic lesion of cervical region 07/11/2015  . Nonallopathic lesion of thoracic region 07/11/2015  . Biceps tendinitis on left 06/17/2015  . Left rotator cuff tear 04/15/2015  . Bilateral shoulder pain 04/06/2015  . Allergic rhinitis, cause unspecified 02/05/2014  . Raynaud phenomenon 01/22/2013  . Abnormal TSH 01/22/2013  . Muscle strain, lower leg 06/18/2012  . Lumbar radicular pain 04/24/2011  . Colon polyps 01/12/2011  . Preventative health care 01/12/2011  . DIVERTICULITIS OF COLON 04/21/2010  . MENOPAUSE, EARLY 01/04/2010  . ANXIETY 12/24/2007  . DEPRESSION 12/24/2007  . HYPERLIPIDEMIA 05/31/2007  . MIGRAINE HEADACHE 05/31/2007  . Essential hypertension 05/31/2007    TAKACS,KELLY, PT 07/26/2015, 9:24 AM  [facility] [address] [place], [place], [address] Phone: [phone]   Fax:  [phone]  Name: Barbara Gross MRN: [id number] Date of Birth: 1956/06/08

## 2015-08-01 ENCOUNTER — Ambulatory Visit: Payer: BLUE CROSS/BLUE SHIELD | Admitting: Family Medicine

## 2015-08-02 ENCOUNTER — Encounter: Payer: Self-pay | Admitting: Physical Therapy

## 2015-08-02 ENCOUNTER — Ambulatory Visit: Payer: BLUE CROSS/BLUE SHIELD | Attending: Family Medicine | Admitting: Physical Therapy

## 2015-08-02 DIAGNOSIS — R29898 Other symptoms and signs involving the musculoskeletal system: Secondary | ICD-10-CM

## 2015-08-02 DIAGNOSIS — M25612 Stiffness of left shoulder, not elsewhere classified: Secondary | ICD-10-CM | POA: Diagnosis present

## 2015-08-02 DIAGNOSIS — M25512 Pain in left shoulder: Secondary | ICD-10-CM | POA: Insufficient documentation

## 2015-08-02 NOTE — Therapy (Addendum)
[facility] [address] [place], [place], [address] Phone: [phone]   Fax:  [phone]  Physical Therapy Treatment  Patient Details  Name: Barbara Gross MRN: [id number] Date of Birth: 07/12/1956 Referring Provider: Hulan Saas, MD  Encounter Date: 08/02/2015      PT End of Session - 08/02/15 0814    Visit Number 2   Date for PT Re-Evaluation 09/20/15   PT Start Time 0802   PT Stop Time 2703   PT Time Calculation (min) 42 min   Activity Tolerance Patient tolerated treatment well   Behavior During Therapy [facility] for tasks assessed/performed      Past Medical History  Diagnosis Date  . HYPERLIPIDEMIA 05/31/2007  . ANXIETY 12/24/2007  . DEPRESSION 12/24/2007  . MIGRAINE HEADACHE 05/31/2007  . HYPERTENSION 05/31/2007  . DIVERTICULITIS OF COLON 04/21/2010  . Microscopic hematuria 01/03/2009  . BREAST MASS, LEFT 07/15/2008  . BACTERIAL VAGINITIS 12/24/2007  . MENOPAUSE, EARLY 01/04/2010  . ABDOMINAL PAIN, LOWER 05/22/2010    Past Surgical History  Procedure Laterality Date  . Abdominal hysterectomy    . Oophorectomy    . Colonoscopy    . Polypectomy    . Exploratory laparotomy      There were no vitals filed for this visit.  Visit Diagnosis:  Pain in joint of left shoulder  Stiffness of shoulder joint, left  Weakness of shoulder      Subjective Assessment - 08/02/15 0806    Subjective Pt complains of Lt shoulder pain that began 03/2015, noticed discomfort with reaching out to the side placing on shirt. No complain of pain with rest, but with certain movement up to 6-7/10.      Pertinent History cortizone injection into Lt shoulder 03/2015   Currently in Pain? Yes   Pain Score 6   with movement, not in resting position   Pain Location Shoulder   Pain Orientation Left   Pain Descriptors / Indicators Sore   Pain Type Chronic pain   Pain Onset More than a month ago   Pain Frequency Intermittent   Multiple Pain Sites No                         [facility] Adult PT Treatment/Exercise - 08/02/15 0001    Posture/Postural Control   Posture/Postural Control Postural limitations   Postural Limitations Rounded Shoulders;Forward head   Exercises   Exercises Shoulder   Shoulder Exercises: Supine   Other Supine Exercises thoracic/cervical selfmob over towelroo x 38min, bil UE into 90 flexion x 10, overhead flexion painful    Shoulder Exercises: Standing   Extension Strengthening;20 reps;Theraband  red t-band   Retraction Strengthening;20 reps;Theraband  red t-band   Shoulder Exercises: Pulleys   Flexion 3 minutes   ABduction 3 minutes   Modalities   Modalities Iontophoresis   Iontophoresis   Type of Iontophoresis Dexamethasone   Location ant shoulder joint   Dose 1   Time 6hours                  PT Short Term Goals - 08/02/15 5009    PT SHORT TERM GOAL #1   Title be independent in initial HEP   Time 4   Period Weeks   Status On-going   PT SHORT TERM GOAL #2   Title report a 30% reduction in Lt shoulder pain with overhead use    Time 4   Period Weeks   Status  On-going   PT SHORT TERM GOAL #3   Title perform ER for self-care with < or = to 5/10 Lt shoulder pain   Time 4   Period Weeks   Status On-going           PT Long Term Goals - 08/02/15 7893    PT LONG TERM GOAL #1   Title be independent in advanced HEP   Time 8   Period Weeks   Status On-going   PT LONG TERM GOAL #2   Title reduce FOTO to < or = to 34% limitation   Time 8   Period Weeks   Status On-going   PT LONG TERM GOAL #3   Title report a 60% reduction in Lt shoulder pain with overhead use   Time 8   Period Weeks   Status On-going   PT LONG TERM GOAL #4   Title demonstrate 4+/5 Lt shoulder strength to improve endurance with use   Time 8   Period Weeks   Status On-going               Plan - 08/02/15 0819    Clinical Impression Statement Pt able to perform  activities in gym but with discomfort at available end range of left shoulder mainly with abduction and rotational activities. Pt will enefit from skilled PT for Lt shoulder flexibility, postural re-education.   Rehab Potential Good   PT Frequency 2x / week   PT Duration 8 weeks   PT Treatment/Interventions ADLs/Self Care Home Management;Cryotherapy;Electrical Stimulation;Iontophoresis 4mg /ml Dexamethasone;Moist Heat;Therapeutic exercise;Therapeutic activities;Ultrasound;Neuromuscular re-education;Patient/family education;Manual techniques;Dry needling;Passive range of motion   PT Next Visit Plan postural education, modalities and manual   Consulted and Agree with Plan of Care Patient        Problem List Patient Active Problem List   Diagnosis Date Noted  . Nonallopathic lesion-rib cage 07/11/2015  . Nonallopathic lesion of cervical region 07/11/2015  . Nonallopathic lesion of thoracic region 07/11/2015  . Biceps tendinitis on left 06/17/2015  . Left rotator cuff tear 04/15/2015  . Bilateral shoulder pain 04/06/2015  . Allergic rhinitis, cause unspecified 02/05/2014  . Raynaud phenomenon 01/22/2013  . Abnormal TSH 01/22/2013  . Muscle strain, lower leg 06/18/2012  . Lumbar radicular pain 04/24/2011  . Colon polyps 01/12/2011  . Preventative health care 01/12/2011  . DIVERTICULITIS OF COLON 04/21/2010  . MENOPAUSE, EARLY 01/04/2010  . ANXIETY 12/24/2007  . DEPRESSION 12/24/2007  . HYPERLIPIDEMIA 05/31/2007  . MIGRAINE HEADACHE 05/31/2007  . Essential hypertension 05/31/2007    NAUMANN-HOUEGNIFIO,Salah Nakamura PTA 08/02/2015, 8:53 AM  [facility] [address] [place], [place], [address] Phone: [phone]   Fax:  [phone]  Name: AUNDRIA BITTERMAN MRN: [id number] Date of Birth: 11-16-1955

## 2015-08-02 NOTE — Patient Instructions (Signed)

## 2015-08-04 ENCOUNTER — Encounter: Payer: Self-pay | Admitting: Family Medicine

## 2015-08-04 ENCOUNTER — Ambulatory Visit (INDEPENDENT_AMBULATORY_CARE_PROVIDER_SITE_OTHER): Payer: BLUE CROSS/BLUE SHIELD | Admitting: Family Medicine

## 2015-08-04 VITALS — BP 122/84 | HR 84 | Ht 64.0 in | Wt 148.0 lb

## 2015-08-04 DIAGNOSIS — M75102 Unspecified rotator cuff tear or rupture of left shoulder, not specified as traumatic: Secondary | ICD-10-CM | POA: Diagnosis not present

## 2015-08-04 NOTE — Assessment & Plan Note (Signed)
Patient is doing quite a significant amount better at this time. 75-85%. Encourage her to finish with the formal physical therapy. We discussed icing regimen and home exercises. We discussed which activities doing which was to avoid. Patient come back in 6 weeks for further evaluation.

## 2015-08-04 NOTE — Patient Instructions (Signed)
Good to see you Ice still is your friend Continue with PT you are doing great Likely still 6 weeks til healed See me again in 6 weeks.

## 2015-08-04 NOTE — Progress Notes (Signed)
Pre visit review using our clinic review tool, if applicable. No additional management support is needed unless otherwise documented below in the visit note. 

## 2015-08-04 NOTE — Progress Notes (Signed)
[name] [place], [place] [address] Phone: [phone] Subjective:    I'm seeing this patient by the request  of:  Cathlean Cower, MD   CC: Left shoulder follow-up  [id number] Barbara Gross is a 59 y.o. female coming in with complaint of left shoulder pain. Patient was seen previously and did have what appeared to be possibly rotator cuff tear as well as biceps tendinitis. Patient elected to try conservative therapy. Patient did have manipulation done. Did not feel any significant improvement with the manipulation. Patient did start with formal physical therapy and states that this has made a significant improvement. Patient states that she is approximately 75-85% better. Has noticed increasing range of motion as well as strength. Able to start increasing her daily activities more. Sleeping more comfortably at night.  Past Medical History  Diagnosis Date  . HYPERLIPIDEMIA 05/31/2007  . ANXIETY 12/24/2007  . DEPRESSION 12/24/2007  . MIGRAINE HEADACHE 05/31/2007  . HYPERTENSION 05/31/2007  . DIVERTICULITIS OF COLON 04/21/2010  . Microscopic hematuria 01/03/2009  . BREAST MASS, LEFT 07/15/2008  . BACTERIAL VAGINITIS 12/24/2007  . MENOPAUSE, EARLY 01/04/2010  . ABDOMINAL PAIN, LOWER 05/22/2010   Past Surgical History  Procedure Laterality Date  . Abdominal hysterectomy    . Oophorectomy    . Colonoscopy    . Polypectomy    . Exploratory laparotomy     Social History  Substance Use Topics  . Smoking status: Never Smoker   . Smokeless tobacco: Never Used  . Alcohol Use: 8.4 oz/week    14 Glasses of wine per week     Comment: 2 glasses of wine a night   Allergies  Allergen Reactions  . Codeine Nausea And Vomiting  . Hydrocodone Nausea And Vomiting  . Venlafaxine Nausea Only   Family History  Problem Relation Age of Onset  . Hypertension Mother   . Ulcers Mother     bleeding  . Hyperlipidemia Mother   . Sudden death Mother    bleeding ulcer 1995  . Diabetes Father   . Hypertension Father   . Hyperlipidemia Father   . Melanoma Other   . Colon cancer Other     dx'd in 79's  . Stroke Other   . Cancer Other     prostate        Past medical history, social, surgical and family history all reviewed in electronic medical record.   Review of Systems: No headache, visual changes, nausea, vomiting, diarrhea, constipation, dizziness, abdominal pain, skin rash, fevers, chills, night sweats, weight loss, swollen lymph nodes, body aches, joint swelling, muscle aches, chest pain, shortness of breath, mood changes.   Objective Blood pressure 122/84, pulse 84, height 5\' 4"  (1.626 m), weight 148 lb (67.132 kg), SpO2 99 %.  General: No apparent distress alert and oriented x3 mood and affect normal, dressed appropriately.  HEENT: Pupils equal, extraocular movements intact  Respiratory: Patient's speak in full sentences and does not appear short of breath  Cardiovascular: No lower extremity edema, non tender, no erythema  Skin: Warm dry intact with no signs of infection or rash on extremities or on axial skeleton.  Abdomen: Soft nontender  Neuro: Cranial nerves II through XII are intact, neurovascularly intact in all extremities with 2+ DTRs and 2+ pulses.  Lymph: No lymphadenopathy of posterior or anterior cervical chain or axillae bilaterally.  Gait normal with good balance and coordination.  MSK:  Non tender with full range of motion and good  stability and symmetric strength and tone of , elbows, wrist, hip, knee and ankles bilaterally.  Shoulder: left Inspection reveals no abnormalities, atrophy or asymmetry. Palpation is normal with no tenderness over AC joint or bicipital groove. ROM is full in all planes passively. 5 out of 5 strength and symmetric to contralateral side Positive impingement signs but improving Negative speed No labral pathology noted with negative Obrien's, negative clunk and good  stability. Normal scapular function observed. No painful arc and no drop arm sign. No apprehension sign Contralateral shoulder unremarkable    Impression and Recommendations:     This case required medical decision making of moderate complexity.

## 2015-08-05 ENCOUNTER — Ambulatory Visit: Payer: BLUE CROSS/BLUE SHIELD | Admitting: Physical Therapy

## 2015-08-05 ENCOUNTER — Encounter: Payer: Self-pay | Admitting: Physical Therapy

## 2015-08-05 DIAGNOSIS — R29898 Other symptoms and signs involving the musculoskeletal system: Secondary | ICD-10-CM

## 2015-08-05 DIAGNOSIS — M25512 Pain in left shoulder: Secondary | ICD-10-CM

## 2015-08-05 DIAGNOSIS — M25612 Stiffness of left shoulder, not elsewhere classified: Secondary | ICD-10-CM

## 2015-08-05 NOTE — Therapy (Signed)
[facility] [address] [place], [place], [address] Phone: [phone]   Fax:  [phone]  Physical Therapy Treatment  Patient Details  Name: Barbara Gross MRN: [id number] Date of Birth: 1956/02/17 Referring Provider: Hulan Saas, MD  Encounter Date: 08/05/2015      PT End of Session - 08/05/15 0910    Visit Number 3   Date for PT Re-Evaluation 09/20/15   PT Start Time 0845   PT Stop Time 0930   PT Time Calculation (min) 45 min   Activity Tolerance Patient tolerated treatment well   Behavior During Therapy [facility] for tasks assessed/performed      Past Medical History  Diagnosis Date  . HYPERLIPIDEMIA 05/31/2007  . ANXIETY 12/24/2007  . DEPRESSION 12/24/2007  . MIGRAINE HEADACHE 05/31/2007  . HYPERTENSION 05/31/2007  . DIVERTICULITIS OF COLON 04/21/2010  . Microscopic hematuria 01/03/2009  . BREAST MASS, LEFT 07/15/2008  . BACTERIAL VAGINITIS 12/24/2007  . MENOPAUSE, EARLY 01/04/2010  . ABDOMINAL PAIN, LOWER 05/22/2010    Past Surgical History  Procedure Laterality Date  . Abdominal hysterectomy    . Oophorectomy    . Colonoscopy    . Polypectomy    . Exploratory laparotomy      There were no vitals filed for this visit.  Visit Diagnosis:  Pain in joint of left shoulder  Stiffness of shoulder joint, left  Weakness of shoulder      Subjective Assessment - 08/05/15 0855    Subjective Pt reports the inoto patch helped a lot   Pertinent History cortizone injection into Lt shoulder  03/2015,   Currently in Pain? Yes   Pain Score 1   up to 5/10 with movement   Pain Location Shoulder   Pain Orientation Left   Pain Descriptors / Indicators Sore   Pain Type Chronic pain   Pain Onset More than a month ago   Pain Frequency Intermittent   Aggravating Factors  reaching into ER, reaching overhead, use of Lt UE, getting dressed, driving   Multiple Pain Sites No                          [facility] Adult PT Treatment/Exercise - 08/05/15 0001    Posture/Postural Control   Posture/Postural Control Postural limitations   Postural Limitations Rounded Shoulders;Forward head   Exercises   Exercises Shoulder   Shoulder Exercises: Supine   Other Supine Exercises thoracic/cervical selfmob over towelroo x 80min, bil UE into 90 flexion x 10, overhead flexion painful    Shoulder Exercises: Pulleys   Flexion 3 minutes   ABduction 3 minutes   Shoulder Exercises: ROM/Strengthening   UBE (Upper Arm Bike) L 0 73min (3/30   Modalities   Modalities Iontophoresis   Iontophoresis   Type of Iontophoresis Dexamethasone   Location #2 ant. left shoulder joint   Dose 51ml   Time 6hours                  PT Short Term Goals - 08/02/15 1017    PT SHORT TERM GOAL #1   Title be independent in initial HEP   Time 4   Period Weeks   Status On-going   PT SHORT TERM GOAL #2   Title report a 30% reduction in Lt shoulder pain with overhead use    Time 4   Period Weeks   Status On-going   PT SHORT TERM GOAL #3   Title perform ER for  self-care with < or = to 5/10 Lt shoulder pain   Time 4   Period Weeks   Status On-going           PT Long Term Goals - 08/02/15 7564    PT LONG TERM GOAL #1   Title be independent in advanced HEP   Time 8   Period Weeks   Status On-going   PT LONG TERM GOAL #2   Title reduce FOTO to < or = to 34% limitation   Time 8   Period Weeks   Status On-going   PT LONG TERM GOAL #3   Title report a 60% reduction in Lt shoulder pain with overhead use   Time 8   Period Weeks   Status On-going   PT LONG TERM GOAL #4   Title demonstrate 4+/5 Lt shoulder strength to improve endurance with use   Time 8   Period Weeks   Status On-going               Plan - 08/05/15 0910    Clinical Impression Statement Pt able to perfomr activities in gym but with discomfort at available end range of left shoulder with abduction and rotational activities. Pt will  continue to benefit from skilled PT and ionto for Lt shoulder flexibility and strength.   Pt will benefit from skilled therapeutic intervention in order to improve on the following deficits Postural dysfunction;Decreased strength;Pain;Decreased activity tolerance   Rehab Potential Good   PT Frequency 2x / week   PT Duration 8 weeks   PT Treatment/Interventions ADLs/Self Care Home Management;Cryotherapy;Electrical Stimulation;Iontophoresis 4mg /ml Dexamethasone;Moist Heat;Therapeutic exercise;Therapeutic activities;Ultrasound;Neuromuscular re-education;Patient/family education;Manual techniques;Dry needling;Passive range of motion   PT Next Visit Plan Continue with Ionto, stretching and strength, MT   Consulted and Agree with Plan of Care Patient        Problem List Patient Active Problem List   Diagnosis Date Noted  . Nonallopathic lesion-rib cage 07/11/2015  . Nonallopathic lesion of cervical region 07/11/2015  . Nonallopathic lesion of thoracic region 07/11/2015  . Biceps tendinitis on left 06/17/2015  . Left rotator cuff tear 04/15/2015  . Bilateral shoulder pain 04/06/2015  . Allergic rhinitis, cause unspecified 02/05/2014  . Raynaud phenomenon 01/22/2013  . Abnormal TSH 01/22/2013  . Muscle strain, lower leg 06/18/2012  . Lumbar radicular pain 04/24/2011  . Colon polyps 01/12/2011  . Preventative health care 01/12/2011  . DIVERTICULITIS OF COLON 04/21/2010  . MENOPAUSE, EARLY 01/04/2010  . ANXIETY 12/24/2007  . DEPRESSION 12/24/2007  . HYPERLIPIDEMIA 05/31/2007  . MIGRAINE HEADACHE 05/31/2007  . Essential hypertension 05/31/2007    NAUMANN-HOUEGNIFIO,Rainelle Sulewski PTA 08/05/2015, 11:56 AM  [facility] [address] [place], [place], [address] Phone: [phone]   Fax:  [phone]  Name: Barbara Gross MRN: [id number] Date of Birth: Feb 24, 1956

## 2015-08-09 ENCOUNTER — Encounter: Payer: Self-pay | Admitting: Physical Therapy

## 2015-08-09 ENCOUNTER — Ambulatory Visit: Payer: BLUE CROSS/BLUE SHIELD | Admitting: Physical Therapy

## 2015-08-09 DIAGNOSIS — R29898 Other symptoms and signs involving the musculoskeletal system: Secondary | ICD-10-CM

## 2015-08-09 DIAGNOSIS — M25612 Stiffness of left shoulder, not elsewhere classified: Secondary | ICD-10-CM

## 2015-08-09 DIAGNOSIS — M25512 Pain in left shoulder: Secondary | ICD-10-CM | POA: Diagnosis not present

## 2015-08-09 NOTE — Therapy (Signed)
[facility] [address], [place], [place], [address] Phone: [phone]   Fax:  [phone]  Physical Therapy Treatment  Patient Details  Name: Barbara Gross MRN: [id number] Date of Birth: [birth year] Referring Provider: Hulan Saas, MD  Encounter Date: 08/09/2015      PT End of Session - 08/09/15 0829    Visit Number 4   Date for PT Re-Evaluation 09/20/15   PT Start Time 0800   PT Stop Time 0845   PT Time Calculation (min) 45 min   Activity Tolerance Patient tolerated treatment well   Behavior During Therapy [facility] for tasks assessed/performed      Past Medical History  Diagnosis Date  . HYPERLIPIDEMIA 05/31/2007  . ANXIETY 12/24/2007  . DEPRESSION 12/24/2007  . MIGRAINE HEADACHE 05/31/2007  . HYPERTENSION 05/31/2007  . DIVERTICULITIS OF COLON 04/21/2010  . Microscopic hematuria 01/03/2009  . BREAST MASS, LEFT 07/15/2008  . BACTERIAL VAGINITIS 12/24/2007  . MENOPAUSE, EARLY 01/04/2010  . ABDOMINAL PAIN, LOWER 05/22/2010    Past Surgical History  Procedure Laterality Date  . Abdominal hysterectomy    . Oophorectomy    . Colonoscopy    . Polypectomy    . Exploratory laparotomy      There were no vitals filed for this visit.  Visit Diagnosis:  Pain in joint of left shoulder  Stiffness of shoulder joint, left  Weakness of shoulder      Subjective Assessment - 08/09/15 0812    Subjective Pt reports with certain movement radiating pain in left med arm    Currently in Pain? Yes   Pain Score 1   up to 2/69 with certain movements   Pain Location Shoulder   Pain Orientation Left   Pain Descriptors / Indicators Sore   Pain Type Chronic pain   Pain Onset More than a month ago   Pain Frequency Intermittent   Multiple Pain Sites No                         [facility] Adult PT Treatment/Exercise - 08/09/15 0001    Posture/Postural Control   Posture/Postural Control Postural limitations   Postural Limitations Rounded Shoulders;Forward head   Exercises   Exercises Shoulder   Shoulder Exercises: Supine   Other Supine Exercises thoracic/cervical selfmob over towelroll x 64min, bil UE into 90 flexion x 10, overhead flexion painful    Shoulder Exercises: Pulleys   Flexion 3 minutes   ABduction 3 minutes   Shoulder Exercises: ROM/Strengthening   UBE (Upper Arm Bike) L 0 38min (3/3)   Modalities   Modalities Iontophoresis   Iontophoresis   Type of Iontophoresis Dexamethasone   Location #3 ant. left shoulder joint   Dose 72ml   Time 6hours   Manual Therapy   Manual Therapy Soft tissue mobilization   Manual therapy comments ant/posterior shoulder and surrounding scapular muscles                  PT Short Term Goals - 08/09/15 0848    PT SHORT TERM GOAL #1   Title be independent in initial HEP   Time 4   Period Weeks   Status On-going   PT SHORT TERM GOAL #2   Title report a 30% reduction in Lt shoulder pain with overhead use    Time 4   Period Weeks   Status On-going   PT SHORT TERM GOAL #3   Title perform ER for self-care with <  or = to 5/10 Lt shoulder pain   Time 4   Period Weeks   Status On-going           PT Long Term Goals - 08/02/15 9295    PT LONG TERM GOAL #1   Title be independent in advanced HEP   Time 8   Period Weeks   Status On-going   PT LONG TERM GOAL #2   Title reduce FOTO to < or = to 34% limitation   Time 8   Period Weeks   Status On-going   PT LONG TERM GOAL #3   Title report a 60% reduction in Lt shoulder pain with overhead use   Time 8   Period Weeks   Status On-going   PT LONG TERM GOAL #4   Title demonstrate 4+/5 Lt shoulder strength to improve endurance with use   Time 8   Period Weeks   Status On-going               Plan - 08/09/15 0844    Clinical Impression Statement Pt continues to complain of pain with certain movement of left shoulder with abduction and rotational activities and presents with  thoracic hypomobility. Pt will continue to benefit from skilled Pt and ionto for Lt shoulder flexibility    Pt will benefit from skilled therapeutic intervention in order to improve on the following deficits Postural dysfunction;Decreased strength;Pain;Decreased activity tolerance   Rehab Potential Good   PT Frequency 2x / week   PT Duration 8 weeks   PT Next Visit Plan continue with Ionto, stretching and strength, Manual therapy   Consulted and Agree with Plan of Care Patient        Problem List Patient Active Problem List   Diagnosis Date Noted  . Nonallopathic lesion-rib cage 07/11/2015  . Nonallopathic lesion of cervical region 07/11/2015  . Nonallopathic lesion of thoracic region 07/11/2015  . Biceps tendinitis on left 06/17/2015  . Left rotator cuff tear 04/15/2015  . Bilateral shoulder pain 04/06/2015  . Allergic rhinitis, cause unspecified 02/05/2014  . Raynaud phenomenon 01/22/2013  . Abnormal TSH 01/22/2013  . Muscle strain, lower leg 06/18/2012  . Lumbar radicular pain 04/24/2011  . Colon polyps 01/12/2011  . Preventative health care 01/12/2011  . DIVERTICULITIS OF COLON 04/21/2010  . MENOPAUSE, EARLY 01/04/2010  . ANXIETY 12/24/2007  . DEPRESSION 12/24/2007  . HYPERLIPIDEMIA 05/31/2007  . MIGRAINE HEADACHE 05/31/2007  . Essential hypertension 05/31/2007    NAUMANN-HOUEGNIFIO,Delrae Hagey PTA 08/09/2015, 8:52 AM  [facility] [address], [place], [place], [address] Phone: [phone]   Fax:  [phone]  Name: SHALISSA EASTERWOOD MRN: [id number] Date of Birth: 04-Nov-1955

## 2015-08-12 ENCOUNTER — Ambulatory Visit: Payer: BLUE CROSS/BLUE SHIELD | Admitting: Physical Therapy

## 2015-08-12 ENCOUNTER — Encounter: Payer: Self-pay | Admitting: Physical Therapy

## 2015-08-12 DIAGNOSIS — M25512 Pain in left shoulder: Secondary | ICD-10-CM | POA: Diagnosis not present

## 2015-08-12 DIAGNOSIS — R29898 Other symptoms and signs involving the musculoskeletal system: Secondary | ICD-10-CM

## 2015-08-12 DIAGNOSIS — M25612 Stiffness of left shoulder, not elsewhere classified: Secondary | ICD-10-CM

## 2015-08-12 NOTE — Patient Instructions (Signed)
Thoracic Self-Mobilization (Sitting)   With small rolled towel at [facility] area, gently lean back until stretch is felt. Perform dynamic x 10 hold for 3 seconds.. Repeat 10  times per set. Do  2-3 sets per session. Do several sessions per day, at work.  http://orth.exer.us/998   Copyright  VHI. All rights reserved.  Thoracic Self-Mobilization Stretch (Supine)   With small rolled towel at [facility] area. gently lie back until stretch is felt. Hold  2 to 3 minutes.  Relax. Repeat  1 times per set.  Do _1-2 sessions per day.  http://orth.exer.us/994   Copyright  VHI. All rights reserved.

## 2015-08-12 NOTE — Therapy (Signed)
[facility] [address], [place], [place], [address] Phone: [phone]   Fax:  [phone]  Physical Therapy Treatment  Patient Details  Name: Barbara Gross MRN: [id number] Date of Birth: 01/27/1956 Referring Provider: Hulan Saas, MD  Encounter Date: 08/12/2015      PT End of Session - 08/12/15 1020    Visit Number 5   Date for PT Re-Evaluation 09/20/15   PT Start Time 0800   PT Stop Time 0845   PT Time Calculation (min) 45 min   Activity Tolerance Patient tolerated treatment well   Behavior During Therapy [facility] for tasks assessed/performed      Past Medical History  Diagnosis Date  . HYPERLIPIDEMIA 05/31/2007  . ANXIETY 12/24/2007  . DEPRESSION 12/24/2007  . MIGRAINE HEADACHE 05/31/2007  . HYPERTENSION 05/31/2007  . DIVERTICULITIS OF COLON 04/21/2010  . Microscopic hematuria 01/03/2009  . BREAST MASS, LEFT 07/15/2008  . BACTERIAL VAGINITIS 12/24/2007  . MENOPAUSE, EARLY 01/04/2010  . ABDOMINAL PAIN, LOWER 05/22/2010    Past Surgical History  Procedure Laterality Date  . Abdominal hysterectomy    . Oophorectomy    . Colonoscopy    . Polypectomy    . Exploratory laparotomy      There were no vitals filed for this visit.  Visit Diagnosis:  Pain in joint of left shoulder  Stiffness of shoulder joint, left  Weakness of shoulder      Subjective Assessment - 08/12/15 0806    Subjective Pt reports 75% improvement in Rt shoulder, no complain of pain only with movments   Currently in Pain? No/denies                         [facility] Adult PT Treatment/Exercise - 08/12/15 0001    Posture/Postural Control   Posture/Postural Control Postural limitations   Postural Limitations Rounded Shoulders;Forward head   Exercises   Exercises Shoulder   Shoulder Exercises: Supine   Other Supine Exercises thoracic/cervical selfmob over towelroll x 71min, bil UE into 90 flexion x 10, overhead flexion  painful    Shoulder Exercises: Pulleys   Flexion 3 minutes   ABduction 3 minutes   Shoulder Exercises: ROM/Strengthening   UBE (Upper Arm Bike) L 0 26min (3/3)   Modalities   Modalities Iontophoresis   Iontophoresis   Type of Iontophoresis Dexamethasone   Location #4 ant left shoulder   Dose 58ml   Time 6hours   Manual Therapy   Manual Therapy Soft tissue mobilization   Manual therapy comments ant/posterior shoulder and surrounding scapular muscles                PT Education - 08/12/15 0823    Education provided Yes   Education Details thoracic/cervical selfmob in sitting and supine   Person(s) Educated Patient   Methods Explanation;Handout   Comprehension Verbalized understanding          PT Short Term Goals - 08/09/15 0848    PT SHORT TERM GOAL #1   Title be independent in initial HEP   Time 4   Period Weeks   Status On-going   PT SHORT TERM GOAL #2   Title report a 30% reduction in Lt shoulder pain with overhead use    Time 4   Period Weeks   Status On-going   PT SHORT TERM GOAL #3   Title perform ER for self-care with < or = to 5/10 Lt shoulder pain   Time 4  Period Weeks   Status On-going           PT Long Term Goals - 08/02/15 0824    PT LONG TERM GOAL #1   Title be independent in advanced HEP   Time 8   Period Weeks   Status On-going   PT LONG TERM GOAL #2   Title reduce FOTO to < or = to 34% limitation   Time 8   Period Weeks   Status On-going   PT LONG TERM GOAL #3   Title report a 60% reduction in Lt shoulder pain with overhead use   Time 8   Period Weeks   Status On-going   PT LONG TERM GOAL #4   Title demonstrate 4+/5 Lt shoulder strength to improve endurance with use   Time 8   Period Weeks   Status On-going               Problem List Patient Active Problem List   Diagnosis Date Noted  . Nonallopathic lesion-rib cage 07/11/2015  . Nonallopathic lesion of cervical region 07/11/2015  . Nonallopathic lesion of  thoracic region 07/11/2015  . Biceps tendinitis on left 06/17/2015  . Left rotator cuff tear 04/15/2015  . Bilateral shoulder pain 04/06/2015  . Allergic rhinitis, cause unspecified 02/05/2014  . Raynaud phenomenon 01/22/2013  . Abnormal TSH 01/22/2013  . Muscle strain, lower leg 06/18/2012  . Lumbar radicular pain 04/24/2011  . Colon polyps 01/12/2011  . Preventative health care 01/12/2011  . DIVERTICULITIS OF COLON 04/21/2010  . MENOPAUSE, EARLY 01/04/2010  . ANXIETY 12/24/2007  . DEPRESSION 12/24/2007  . HYPERLIPIDEMIA 05/31/2007  . MIGRAINE HEADACHE 05/31/2007  . Essential hypertension 05/31/2007    NAUMANN-HOUEGNIFIO,Lalia Loudon PTA 08/12/2015, 10:21 AM  [facility] [address] [place], [place], [address] Phone: [phone]   Fax:  [phone]  Name: Barbara Gross MRN: [id number] Date of Birth: 04-24-1956

## 2015-08-18 ENCOUNTER — Encounter: Payer: Self-pay | Admitting: Physical Therapy

## 2015-08-18 ENCOUNTER — Ambulatory Visit: Payer: BLUE CROSS/BLUE SHIELD | Admitting: Physical Therapy

## 2015-08-18 DIAGNOSIS — M25512 Pain in left shoulder: Secondary | ICD-10-CM

## 2015-08-18 DIAGNOSIS — R29898 Other symptoms and signs involving the musculoskeletal system: Secondary | ICD-10-CM

## 2015-08-18 DIAGNOSIS — M25612 Stiffness of left shoulder, not elsewhere classified: Secondary | ICD-10-CM

## 2015-08-18 NOTE — Therapy (Signed)
[facility] [address], [place], [place], [address] Phone: [phone]   Fax:  [phone]  Physical Therapy Treatment  Patient Details  Name: Barbara Gross MRN: [id number] Date of Birth: [birth year] Referring Provider: Hulan Saas, MD  Encounter Date: 08/18/2015      PT End of Session - 08/18/15 0811    Visit Number 6   Date for PT Re-Evaluation 09/20/15   PT Start Time 0801   PT Stop Time 0845   PT Time Calculation (min) 44 min   Activity Tolerance Patient tolerated treatment well   Behavior During Therapy [facility] for tasks assessed/performed      Past Medical History  Diagnosis Date  . HYPERLIPIDEMIA 05/31/2007  . ANXIETY 12/24/2007  . DEPRESSION 12/24/2007  . MIGRAINE HEADACHE 05/31/2007  . HYPERTENSION 05/31/2007  . DIVERTICULITIS OF COLON 04/21/2010  . Microscopic hematuria 01/03/2009  . BREAST MASS, LEFT 07/15/2008  . BACTERIAL VAGINITIS 12/24/2007  . MENOPAUSE, EARLY 01/04/2010  . ABDOMINAL PAIN, LOWER 05/22/2010    Past Surgical History  Procedure Laterality Date  . Abdominal hysterectomy    . Oophorectomy    . Colonoscopy    . Polypectomy    . Exploratory laparotomy      There were no vitals filed for this visit.  Visit Diagnosis:  Pain in joint of left shoulder  Stiffness of shoulder joint, left  Weakness of shoulder      Subjective Assessment - 08/18/15 0807    Subjective Pt reports 75% improved in left shoulder, pain with reaching a certain way causes still pain   Currently in Pain? No/denies                         [facility] Adult PT Treatment/Exercise - 08/18/15 0001    Posture/Postural Control   Posture/Postural Control Postural limitations   Postural Limitations Rounded Shoulders;Forward head   Exercises   Exercises Shoulder   Shoulder Exercises: Supine   Other Supine Exercises thoracic/cervical selfmob over towelroll x 42min, bil UE into 90 flexion x 10,  overhead flexion painful    Other Supine Exercises Foam roll x 3 min for elongation and decompression x 3 min, and overhead flexion 2 x 10, after with improved shoulder movement   Shoulder Exercises: Standing   ABduction AAROM;10 reps  with 5 sec hold for stretching   Other Standing Exercises Flexion stretch 2 x 10 with 5 sec hold   Shoulder Exercises: Pulleys   Flexion 3 minutes   ABduction 3 minutes   Shoulder Exercises: ROM/Strengthening   UBE (Upper Arm Bike) L 0 97min (3/3)   Modalities   Modalities Iontophoresis   Iontophoresis   Type of Iontophoresis Dexamethasone   Location #5 ant left shoulder   Dose 43ml   Time 6hours   Manual Therapy   Manual Therapy Soft tissue mobilization   Manual therapy comments ant/posterior shoulder and surrounding scapular muscles                PT Education - 08/18/15 0827    Education Details stretching into flexion and abduction in standing at wall flexion and abduction, sitting flexion supine overhead flexion    Person(s) Educated Patient   Methods Explanation;Handout   Comprehension Verbalized understanding          PT Short Term Goals - 08/18/15 0816    PT SHORT TERM GOAL #1   Title be independent in initial HEP   Time 4  Period Weeks   Status On-going   PT SHORT TERM GOAL #2   Title report a 30% reduction in Lt shoulder pain with overhead use    Time 4   Period Weeks   Status On-going   PT SHORT TERM GOAL #3   Title perform ER for self-care with < or = to 5/10 Lt shoulder pain   Time 4   Period Weeks   Status On-going           PT Long Term Goals - 08/18/15 [id number]    PT LONG TERM GOAL #1   Title be independent in advanced HEP   Time 8   Period Weeks   Status On-going   PT LONG TERM GOAL #2   Title reduce FOTO to < or = to 34% limitation   Time 8   Period Weeks   Status On-going   PT LONG TERM GOAL #3   Title report a 60% reduction in Lt shoulder pain with overhead use   Time 8   Period Weeks    Status On-going   PT LONG TERM GOAL #4   Title demonstrate 4+/5 Lt shoulder strength to improve endurance with use   Time 8   Period Weeks   Status On-going               Plan - 08/18/15 [id number]    Clinical Impression Statement Pt continues to complain with pain rated as up to 0000000 with certain movements of left shoulder, and presents with thoracic hypomobility. Pt will continue to benefit from skilled PT and ionto for Lt shoulder   Pt will benefit from skilled therapeutic intervention in order to improve on the following deficits Postural dysfunction;Decreased strength;Pain;Decreased activity tolerance   Rehab Potential Good   PT Frequency 2x / week   PT Duration 8 weeks   PT Treatment/Interventions ADLs/Self Care Home Management;Cryotherapy;Electrical Stimulation;Iontophoresis 4mg /ml Dexamethasone;Moist Heat;Therapeutic exercise;Therapeutic activities;Ultrasound;Neuromuscular re-education;Patient/family education;Manual techniques;Dry needling;Passive range of motion   PT Next Visit Plan Ionto #6, stretching and strength, Manual therapy   Consulted and Agree with Plan of Care Patient        Problem List Patient Active Problem List   Diagnosis Date Noted  . Nonallopathic lesion-rib cage 07/11/2015  . Nonallopathic lesion of cervical region 07/11/2015  . Nonallopathic lesion of thoracic region 07/11/2015  . Biceps tendinitis on left 06/17/2015  . Left rotator cuff tear 04/15/2015  . Bilateral shoulder pain 04/06/2015  . Allergic rhinitis, cause unspecified 02/05/2014  . Raynaud phenomenon 01/22/2013  . Abnormal TSH 01/22/2013  . Muscle strain, lower leg 06/18/2012  . Lumbar radicular pain 04/24/2011  . Colon polyps 01/12/2011  . Preventative health care 01/12/2011  . DIVERTICULITIS OF COLON 04/21/2010  . MENOPAUSE, EARLY 01/04/2010  . ANXIETY 12/24/2007  . DEPRESSION 12/24/2007  . HYPERLIPIDEMIA 05/31/2007  . MIGRAINE HEADACHE 05/31/2007  . Essential hypertension  05/31/2007    NAUMANN-HOUEGNIFIO,Dustyn Armbrister PTA 08/18/2015, 8:52 AM  [facility] [address], [place], [place], [address] Phone: [phone]   Fax:  [phone]  Name: Barbara Gross MRN: [id number] Date of Birth: 1955/12/10

## 2015-08-18 NOTE — Patient Instructions (Signed)
Hold all stretches 5-10 seconds and perform 5-10 times, 3 times a day.   Slide right arm up wall, with  PALM FACING THE WALL, by leaning toward wall.  Copyright  VHI. All rights reserved.   Sitting upright, slide forearm forward along table, bending from the waist until a stretch is felt. Copyright  VHI. All rights reserved.    Clasp hands together and raise arms above head, keeping elbows as straight as possible. Can be done sitting or lying.   Copyright  VHI. All rights reserved.   

## 2015-08-30 ENCOUNTER — Ambulatory Visit: Payer: BLUE CROSS/BLUE SHIELD

## 2015-08-30 DIAGNOSIS — R29898 Other symptoms and signs involving the musculoskeletal system: Secondary | ICD-10-CM

## 2015-08-30 DIAGNOSIS — M25512 Pain in left shoulder: Secondary | ICD-10-CM

## 2015-08-30 DIAGNOSIS — M25612 Stiffness of left shoulder, not elsewhere classified: Secondary | ICD-10-CM

## 2015-08-30 NOTE — Therapy (Signed)
[facility] [address], [place], [place], [address] Phone: [phone]   Fax:  [phone]  Physical Therapy Treatment  Patient Details  Name: Barbara Gross MRN: [id number] Date of Birth: 1956/04/23 Referring Provider: Hulan Saas, MD  Encounter Date: 08/30/2015      PT End of Session - 08/30/15 0838    Visit Number 7   PT Start Time 0805   PT Stop Time 0837   PT Time Calculation (min) 32 min   Activity Tolerance Patient tolerated treatment well   Behavior During Therapy [facility] for tasks assessed/performed      Past Medical History  Diagnosis Date  . HYPERLIPIDEMIA 05/31/2007  . ANXIETY 12/24/2007  . DEPRESSION 12/24/2007  . MIGRAINE HEADACHE 05/31/2007  . HYPERTENSION 05/31/2007  . DIVERTICULITIS OF COLON 04/21/2010  . Microscopic hematuria 01/03/2009  . BREAST MASS, LEFT 07/15/2008  . BACTERIAL VAGINITIS 12/24/2007  . MENOPAUSE, EARLY 01/04/2010  . ABDOMINAL PAIN, LOWER 05/22/2010    Past Surgical History  Procedure Laterality Date  . Abdominal hysterectomy    . Oophorectomy    . Colonoscopy    . Polypectomy    . Exploratory laparotomy      There were no vitals filed for this visit.  Visit Diagnosis:  Pain in joint of left shoulder  Stiffness of shoulder joint, left  Weakness of shoulder      Subjective Assessment - 08/30/15 0810    Subjective Ready for discharge.  85% improvement since the start of care.     Currently in Pain? No/denies            [facility] PT Assessment - 08/30/15 0001    Assessment   Medical Diagnosis Lt rotator cuff tear   Onset Date/Surgical Date 03/03/15   Hand Dominance Right   Observation/Other Assessments   Focus on Therapeutic Outcomes (FOTO)  41% limitation   Strength   Overall Strength Deficits   Left Shoulder Flexion 4+/5   Left Shoulder ABduction 4/5   Left Shoulder Internal Rotation 4+/5   Left Shoulder External Rotation 4+/5                      [facility] Adult PT Treatment/Exercise - 08/30/15 0001    Shoulder Exercises: Seated   Horizontal ABduction Strengthening;Both;20 reps;Theraband   Theraband Level (Shoulder Horizontal ABduction) Level 2 (Red)   Shoulder Exercises: Standing   Other Standing Exercises 3 way raises: 1# 2x10   Shoulder Exercises: Pulleys   Flexion 3 minutes   ABduction 3 minutes   Shoulder Exercises: ROM/Strengthening   UBE (Upper Arm Bike) Level 1 x 6 (3/3)   Modalities   Modalities Iontophoresis   Iontophoresis   Type of Iontophoresis Dexamethasone   Location #6 ant left shoulder   Dose 35m   Time 6hours                PT Education - 08/30/15 0829    Education provided Yes   Education Details 3 way raises, ionto   Person(s) Educated Patient   Methods Explanation;Handout;Demonstration   Comprehension Verbalized understanding;Returned demonstration          PT Short Term Goals - 08/30/15 0841    PT SHORT TERM GOAL #1   Title be independent in initial HEP   Status Achieved   PT SHORT TERM GOAL #2   Title report a 30% reduction in Lt shoulder pain with overhead use    Status Achieved   PT SHORT  TERM GOAL #3   Title perform ER for self-care with < or = to 5/10 Lt shoulder pain   Status Achieved           PT Long Term Goals - 08/30/15 0810    PT LONG TERM GOAL #1   Title be independent in advanced HEP   Status Achieved   PT LONG TERM GOAL #2   Title reduce FOTO to < or = to 34% limitation   Status Partially Met  41% limitation   PT LONG TERM GOAL #3   Title report a 60% reduction in Lt shoulder pain with overhead use   Status Achieved   PT LONG TERM GOAL #4   Status Partially Met               Plan - 08/30/15 0830    Clinical Impression Statement Pt reports 85% overall improvement since the start of care is is ready for D/C.  Pt with mild strength limitations (see MMT) and pain only with "certain motions".  Pt will be discharged to  HEP,   PT Next Visit Plan D/C PT to HEP   Consulted and Agree with Plan of Care Patient        Problem List Patient Active Problem List   Diagnosis Date Noted  . Nonallopathic lesion-rib cage 07/11/2015  . Nonallopathic lesion of cervical region 07/11/2015  . Nonallopathic lesion of thoracic region 07/11/2015  . Biceps tendinitis on left 06/17/2015  . Left rotator cuff tear 04/15/2015  . Bilateral shoulder pain 04/06/2015  . Allergic rhinitis, cause unspecified 02/05/2014  . Raynaud phenomenon 01/22/2013  . Abnormal TSH 01/22/2013  . Muscle strain, lower leg 06/18/2012  . Lumbar radicular pain 04/24/2011  . Colon polyps 01/12/2011  . Preventative health care 01/12/2011  . DIVERTICULITIS OF COLON 04/21/2010  . MENOPAUSE, EARLY 01/04/2010  . ANXIETY 12/24/2007  . DEPRESSION 12/24/2007  . HYPERLIPIDEMIA 05/31/2007  . MIGRAINE HEADACHE 05/31/2007  . Essential hypertension 05/31/2007  PHYSICAL THERAPY DISCHARGE SUMMARY  Visits from Start of Care: 7  Current functional level related to goals / functional outcomes: See above for current status.     Remaining deficits: Mild strength deficits and intermittent/mild pain with certain motions for reaching.  Pt has HEP in place for continued gains.     Education / Equipment: HEP Plan: Patient agrees to discharge.  Patient goals were not met. Patient is being discharged due to meeting the stated rehab goals.  ?????      TAKACS,KELLY, PT 08/30/2015, 8:42 AM  [facility] [address], [place], [place], [address] Phone: [phone]   Fax:  [phone]  Name: Barbara Gross MRN: [id number] Date of Birth: Sep 12, 1956

## 2015-08-30 NOTE — Patient Instructions (Signed)
SHOULDER: Flexion Unilateral (Weight)   Start with arm at side. Raise both arms forward and up to shoulder high  Keep elbows straight.  Use   lb weight.10  reps per set, 2-3___ sets per day.  Copyright  VHI. All rights reserved.  SHOULDER: Abduction (Weight)   Raise arm out and up. Keep elbow straight. Do not shrug shoulders. Use  # lb weight. _10__ reps per set, 2-_3__ sets per day.  Copyright  VHI. All rights reserved.  SHOULDER: Scaption (Weight)   Place arm at 45 angle to body. Raise arm up to shoulder level - shoulder keeping elbow straight.  Use   lb weight. _10__ reps per set, _2-3__ sets per day.  IONTOPHORESIS PATIENT PRECAUTIONS & CONTRAINDICATIONS:  . Redness under one or both electrodes can occur.  This characterized by a uniform redness that usually disappears within 12 hours of treatment. . Small pinhead size blisters may result in response to the drug.  Contact your physician if the problem persists more than 24 hours. . On rare occasions, iontophoresis therapy can result in temporary skin reactions such as rash, inflammation, irritation or burns.  The skin reactions may be the result of individual sensitivity to the ionic solution used, the condition of the skin at the start of treatment, reaction to the materials in the electrodes, allergies or sensitivity to dexamethasone, or a poor connection between the patch and your skin.  Discontinue using iontophoresis if you have any of these reactions and report to your therapist. . Remove the Patch or electrodes if you have any undue sensation of pain or burning during the treatment and report discomfort to your therapist. . Tell your Therapist if you have had known adverse reactions to the application of electrical current. . If using the Patch, the LED light will turn off when treatment is complete and the patch can be removed.  Approximate treatment time is 1-3 hours.  Remove the patch when light goes off or after 6  hours. . The Patch can be worn during normal activity, however excessive motion where the electrodes have been placed can cause poor contact between the skin and the electrode or uneven electrical current resulting in greater risk of skin irritation. Marland Kitchen Keep out of the reach of children.   . DO NOT use if you have a cardiac pacemaker or any other electrically sensitive implanted device. . DO NOT use if you have a known sensitivity to dexamethasone. . DO NOT use during Magnetic Resonance Imaging (MRI). . DO NOT use over broken or compromised skin (e.g. sunburn, cuts, or acne) due to the increased risk of skin reaction. . DO NOT SHAVE over the area to be treated:  To establish good contact between the Patch and the skin, excessive hair may be clipped. . DO NOT place the Patch or electrodes on or over your eyes, directly over your heart, or brain. DO NOT reuse the Patch or electrodes as this may cause burns to occur.  [facility] [address] [place], [place] [address] Phone # [phone] Fax [phone]

## 2015-09-01 ENCOUNTER — Encounter: Payer: BLUE CROSS/BLUE SHIELD | Admitting: Physical Therapy

## 2015-09-14 ENCOUNTER — Ambulatory Visit: Payer: BLUE CROSS/BLUE SHIELD | Admitting: Family Medicine

## 2015-10-02 HISTORY — PX: POLYPECTOMY: SHX149

## 2015-10-02 HISTORY — PX: COLONOSCOPY: SHX174

## 2015-10-05 ENCOUNTER — Other Ambulatory Visit: Payer: Self-pay | Admitting: Internal Medicine

## 2016-01-30 ENCOUNTER — Encounter: Payer: Self-pay | Admitting: Internal Medicine

## 2016-02-13 ENCOUNTER — Other Ambulatory Visit: Payer: Self-pay | Admitting: Internal Medicine

## 2016-02-15 ENCOUNTER — Encounter: Payer: Self-pay | Admitting: Internal Medicine

## 2016-02-15 NOTE — Telephone Encounter (Signed)
[place] for staff to assist pt with making appt with Dr Tamala Julian please for lower back pain

## 2016-02-20 ENCOUNTER — Encounter: Payer: Self-pay | Admitting: Family Medicine

## 2016-02-20 ENCOUNTER — Ambulatory Visit (INDEPENDENT_AMBULATORY_CARE_PROVIDER_SITE_OTHER)
Admission: RE | Admit: 2016-02-20 | Discharge: 2016-02-20 | Disposition: A | Discharge status: Home / Self Care | Payer: BLUE CROSS/BLUE SHIELD | Source: Ambulatory Visit | Attending: Family Medicine | Admitting: Family Medicine

## 2016-02-20 ENCOUNTER — Ambulatory Visit (INDEPENDENT_AMBULATORY_CARE_PROVIDER_SITE_OTHER): Payer: BLUE CROSS/BLUE SHIELD | Admitting: Family Medicine

## 2016-02-20 VITALS — BP 132/82 | HR 82 | Wt 147.0 lb

## 2016-02-20 DIAGNOSIS — M545 Low back pain, unspecified: Secondary | ICD-10-CM

## 2016-02-20 MED ORDER — KETOROLAC TROMETHAMINE 60 MG/2ML IM SOLN
60.0000 mg | Freq: Once | INTRAMUSCULAR | Status: AC
  Administered 2016-02-20: 60 mg via INTRAMUSCULAR

## 2016-02-20 MED ORDER — MELOXICAM 15 MG PO TABS: 15.0000 mg | ORAL_TABLET | Freq: Every day | ORAL | Status: DC

## 2016-02-20 MED ORDER — METHYLPREDNISOLONE ACETATE 80 MG/ML IJ SUSP
80.0000 mg | Freq: Once | INTRAMUSCULAR | Status: AC
  Administered 2016-02-20: 80 mg via INTRAMUSCULAR

## 2016-02-20 MED ORDER — TIZANIDINE HCL 4 MG PO TABS: 4.0000 mg | ORAL_TABLET | Freq: Every evening | ORAL | Status: DC

## 2016-02-20 NOTE — Assessment & Plan Note (Signed)
Patient is having more of right-sided low back pain. Seems to be localized. No radiation down the leg or any signs of nerve root impingement. Patient's MRI in 2012 showed some mild osteophytic changes and facet arthropathy. X-rays ordered today for to see if there is any further progression. Patient given 2 injections today, we discussed meloxicam as well as a muscle relaxer at night. Patient work with her trainer to learn home exercises. Patient and will come back and see me again in 2 weeks to make sure she is responding. If worsening symptoms possibly formal physical therapy or if any radicular symptoms occur that advance imaging would be warranted.

## 2016-02-20 NOTE — Patient Instructions (Signed)
Good to see you  Xray downstairs today  Exercises 3 times a week. And especially after activity or sitting a long amount of time.  If you want you can use heat but have to follow it with at least 10 minutes of ice.  Starting tomorrow meloxicam daily for 10 days Zanaflex at night for next 3 nights and then as needed.  See me again in 2 weeks to make sure all better.

## 2016-02-20 NOTE — Progress Notes (Signed)
[facility] [address], [place] [address] Phone: [phone] Subjective:     CC: Right sided low back pain  [id number] Barbara Gross is a 60 y.o. female coming in with complaint of right-sided low back pain. Patient has had an intermittent pain with this previously. An states that this is been worse over the course last 6 months and seems to be slowly progressing. States that it has been severe enough that stops her from even daily activities. Worse with flexed position. Denies any radiation down the leg or any numbness or tingling. Denies any weakness of the large cavity. Sometimes can be sore at night. Does respond to anti-inflammatories. Rates the severity pain at its worse is 8 out of 10. No injury.   previous imaging studies were reviewed and independently visualized by me. 2012 MRI showed the patient did have some facet arthropathy at L4-L5 mostly on the left side and mild osteoarthritic changes at multiple levels.    Past Medical History  Diagnosis Date  . HYPERLIPIDEMIA 05/31/2007  . ANXIETY 12/24/2007  . DEPRESSION 12/24/2007  . MIGRAINE HEADACHE 05/31/2007  . HYPERTENSION 05/31/2007  . DIVERTICULITIS OF COLON 04/21/2010  . Microscopic hematuria 01/03/2009  . BREAST MASS, LEFT 07/15/2008  . BACTERIAL VAGINITIS 12/24/2007  . MENOPAUSE, EARLY 01/04/2010  . ABDOMINAL PAIN, LOWER 05/22/2010   Past Surgical History  Procedure Laterality Date  . Abdominal hysterectomy    . Oophorectomy    . Colonoscopy    . Polypectomy    . Exploratory laparotomy     Social History   Social History  . Marital Status: Single    Spouse Name: N/A  . Number of Children: N/A  . Years of Education: N/A   Occupational History  . 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 Smoking status: Never Smoker   . Smokeless tobacco: Never Used  . Alcohol Use: 8.4 oz/week    14 Glasses of wine per week     Comment: 2 glasses of wine a  night  . Drug Use: No  . Sexual Activity: Not Asked   Other Topics Concern  . None   Social History Narrative   Allergies  Allergen Reactions  . Codeine Nausea And Vomiting  . Hydrocodone Nausea And Vomiting  . Venlafaxine Nausea Only   Family History  Problem Relation Age of Onset  . Hypertension Mother   . Ulcers Mother     bleeding  . Hyperlipidemia Mother   . Sudden death Mother     bleeding ulcer 1995  . Diabetes Father   . Hypertension Father   . Hyperlipidemia Father   . Melanoma Other   . Colon cancer Other     dx'd in 42's  . Stroke Other   . Cancer Other     prostate    Past medical history, social, surgical and family history all reviewed in electronic medical record.  No pertanent information unless stated regarding to the chief complaint.   Review of Systems: No headache, visual changes, nausea, vomiting, diarrhea, constipation, dizziness, abdominal pain, skin rash, fevers, chills, night sweats, weight loss, swollen lymph nodes, body aches, joint swelling, muscle aches, chest pain, shortness of breath, mood changes.   Objective Blood pressure 132/82, pulse 82, weight 147 lb (66.679 kg).  General: No apparent distress alert and oriented x3 mood and affect normal, dressed appropriately.  HEENT: Pupils equal, extraocular movements intact  Respiratory: Patient's speak in  full sentences and does not appear short of breath  Cardiovascular: No lower extremity edema, non tender, no erythema  Skin: Warm dry intact with no signs of infection or rash on extremities or on axial skeleton.  Abdomen: Soft nontender  Neuro: Cranial nerves II through XII are intact, neurovascularly intact in all extremities with 2+ DTRs and 2+ pulses.  Lymph: No lymphadenopathy of posterior or anterior cervical chain or axillae bilaterally.  Gait normal with good balance and coordination.  MSK:  Non tender with full range of motion and good stability and symmetric strength and tone of  shoulders, elbows, wrist, hip, knee and ankles bilaterally. Mild arthritic changes of multiple joints  Back Exam:  Inspection: Unremarkable  Motion: Flexion 30 deg, Extension 25 deg, Side Bending to 25 deg bilaterally,  Rotation to 35 deg bilaterally  SLR laying: Negative  XSLR laying: Negative  Palpable tenderness: Tender to palpation over the right appears palmar musculature of the lumbar spine especially around the right sacroiliac joint and lateral. FABER: Positive right Sensory change: Gross sensation intact to all lumbar and sacral dermatomes.  Reflexes: 2+ at both patellar tendons, 2+ at achilles tendons, Babinski's downgoing.  Strength at foot  Plantar-flexion: 5/5 Dorsi-flexion: 5/5 Eversion: 5/5 Inversion: 5/5  Leg strength  Quad: 5/5 Hamstring: 5/5 Hip flexor: 5/5 Hip abductors: 4/5  Gait unremarkable.  Procedure note [id number]; 15 minutes spent for Therapeutic exercises as stated in above notes.  This included exercises focusing on stretching, strengthening, with significant focus on eccentric aspects.  Low back exercises that included:  Pelvic tilt/bracing instruction to focus on control of the pelvic girdle and lower abdominal muscles  Glute strengthening exercises, focusing on proper firing of the glutes without engaging the low back muscles Proper stretching techniques for maximum relief for the hamstrings, hip flexors, low back and some rotation where tolerated  Proper technique shown and discussed handout in great detail with ATC.  All questions were discussed and answered.       Impression and Recommendations:     This case required medical decision making of moderate complexity.      Note: This dictation was prepared with Dragon dictation along with smaller phrase technology. Any transcriptional errors that result from this process are unintentional.

## 2016-03-05 ENCOUNTER — Encounter: Payer: Self-pay | Admitting: *Deleted

## 2016-03-05 ENCOUNTER — Encounter: Payer: Self-pay | Admitting: Family Medicine

## 2016-03-05 ENCOUNTER — Ambulatory Visit (INDEPENDENT_AMBULATORY_CARE_PROVIDER_SITE_OTHER): Payer: BLUE CROSS/BLUE SHIELD | Admitting: Family Medicine

## 2016-03-05 VITALS — BP 122/84 | HR 87 | Ht 64.0 in | Wt 148.0 lb

## 2016-03-05 DIAGNOSIS — M545 Low back pain, unspecified: Secondary | ICD-10-CM

## 2016-03-05 MED ORDER — GABAPENTIN 100 MG PO CAPS: 200.0000 mg | ORAL_CAPSULE | Freq: Every day | ORAL | Status: DC

## 2016-03-05 NOTE — Assessment & Plan Note (Signed)
Discussed with patient. Patient does have degenerative disc disease at multiple levels in this could be contribute in. Patient will be started gabapentin to see if this will help with her nighttime pain as well as the early morning stiffness. Discuss using the anti-inflammatories on an as-needed basis. Patient will continue with the home exercises and patient declined formal physical therapy. We discussed icing regimen. We discussed that this does not improve we may want to try a sacroiliac injection at follow-up. Patient will follow-up and see me again in 6 weeks. Patient also knows that if any radicular symptoms, numbness or weakness of the lower extremity occurs we may need to consider advanced imaging.

## 2016-03-05 NOTE — Progress Notes (Signed)
Pre visit review using our clinic review tool, if applicable. No additional management support is needed unless otherwise documented below in the visit note. 

## 2016-03-05 NOTE — Patient Instructions (Signed)
Good to see you  Ice is your friend Try to change sleep position or pillow underbelly button to avoid the extension of the back.  Gabapentin 100mg  at night for next 3 nights then 200mg  nightly thereafter.  \Do the exercises 3 times a week.  See me again in 6 weeks.

## 2016-03-05 NOTE — Progress Notes (Signed)
[name] [address], [place] [address] Phone: [phone] Subjective:     CC: Right sided low back pain  [id number] Barbara Gross is a 60 y.o. female coming in with complaint of right-sided low back pain. Patient does have a past medical history significant for degenerative disc disease of the lumbar spine. Patient was having worsening symptoms. Patient did have repeat imaging. X-rays were independently visualized by me. X-ray show that patient did have some progression of arthritis in multiple levels especially between L2-L3. Patient states that the pain is better. Partially 40% better. Worse with extension no still. Worse in the mornings being the main pain. States that it is difficult for her to even bend down to help her calves. States that the meloxicam was helpful for the first 10 days but no longer. Denies any radiation down the legs any numbness or tingling.   previous imaging studies were reviewed and independently visualized by me. 2012 MRI showed the patient did have some facet arthropathy at L4-L5 mostly on the left side and mild osteoarthritic changes at multiple levels.    Past Medical History  Diagnosis Date  . HYPERLIPIDEMIA 05/31/2007  . ANXIETY 12/24/2007  . DEPRESSION 12/24/2007  . MIGRAINE HEADACHE 05/31/2007  . HYPERTENSION 05/31/2007  . DIVERTICULITIS OF COLON 04/21/2010  . Microscopic hematuria 01/03/2009  . BREAST MASS, LEFT 07/15/2008  . BACTERIAL VAGINITIS 12/24/2007  . MENOPAUSE, EARLY 01/04/2010  . ABDOMINAL PAIN, LOWER 05/22/2010   Past Surgical History  Procedure Laterality Date  . Abdominal hysterectomy    . Oophorectomy    . Colonoscopy    . Polypectomy    . Exploratory laparotomy     Social History   Social History  . Marital Status: Single    Spouse Name: N/A  . Number of Children: N/A  . Years of Education: N/A   Occupational History  . 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 Smoking status: Never Smoker   . Smokeless tobacco: Never Used  . Alcohol Use: 8.4 oz/week    14 Glasses of wine per week     Comment: 2 glasses of wine a night  . Drug Use: No  . Sexual Activity: Not Asked   Other Topics Concern  . None   Social History Narrative   Allergies  Allergen Reactions  . Codeine Nausea And Vomiting  . Hydrocodone Nausea And Vomiting  . Venlafaxine Nausea Only   Family History  Problem Relation Age of Onset  . Hypertension Mother   . Ulcers Mother     bleeding  . Hyperlipidemia Mother   . Sudden death Mother     bleeding ulcer 1995  . Diabetes Father   . Hypertension Father   . Hyperlipidemia Father   . Melanoma Other   . Colon cancer Other     dx'd in 79's  . Stroke Other   . Cancer Other     prostate    Past medical history, social, surgical and family history all reviewed in electronic medical record.  No pertanent information unless stated regarding to the chief complaint.   Review of Systems: No headache, visual changes, nausea, vomiting, diarrhea, constipation, dizziness, abdominal pain, skin rash, fevers, chills, night sweats, weight loss, swollen lymph nodes, body aches, joint swelling, muscle aches, chest pain, shortness of breath, mood changes.   Objective Blood pressure 122/84, pulse 87, height 5\' 4"  (1.626 m), weight 148 lb (67.132 kg),  SpO2 95 %.  General: No apparent distress alert and oriented x3 mood and affect normal, dressed appropriately.  HEENT: Pupils equal, extraocular movements intact  Respiratory: Patient's speak in full sentences and does not appear short of breath  Cardiovascular: No lower extremity edema, non tender, no erythema  Skin: Warm dry intact with no signs of infection or rash on extremities or on axial skeleton.  Abdomen: Soft nontender  Neuro: Cranial nerves II through XII are intact, neurovascularly intact in all extremities with 2+ DTRs and 2+ pulses.  Lymph: No lymphadenopathy of  posterior or anterior cervical chain or axillae bilaterally.  Gait normal with good balance and coordination.  MSK:  Non tender with full range of motion and good stability and symmetric strength and tone of shoulders, elbows, wrist, hip, knee and ankles bilaterally. Mild arthritic changes of multiple joints  Back Exam:  Inspection: Unremarkable  Motion: Flexion 35 deg, Extension 20 deg which is worse, Side Bending to 25 deg bilaterally,  Rotation to 35 deg bilaterally  SLR laying: Negative  XSLR laying: Negative  Palpable tenderness: Continued tenderness over the right sacroiliac joint as well as the paraspinal musculature of the lumbar spine FABER: Positive right but less severe than previous exam Sensory change: Gross sensation intact to all lumbar and sacral dermatomes.  Reflexes: 2+ at both patellar tendons, 2+ at achilles tendons, Babinski's downgoing.  Strength at foot  Plantar-flexion: 5/5 Dorsi-flexion: 5/5 Eversion: 5/5 Inversion: 5/5  Leg strength  Quad: 5/5 Hamstring: 5/5 Hip flexor: 5/5 Hip abductors: 4/5  Gait unremarkable.      Impression and Recommendations:     This case required medical decision making of moderate complexity.      Note: This dictation was prepared with Dragon dictation along with smaller phrase technology. Any transcriptional errors that result from this process are unintentional.

## 2016-03-06 ENCOUNTER — Encounter: Payer: Self-pay | Admitting: Family Medicine

## 2016-03-22 ENCOUNTER — Encounter: Payer: Self-pay | Admitting: Internal Medicine

## 2016-04-02 ENCOUNTER — Other Ambulatory Visit (INDEPENDENT_AMBULATORY_CARE_PROVIDER_SITE_OTHER): Payer: BLUE CROSS/BLUE SHIELD

## 2016-04-02 DIAGNOSIS — Z0000 Encounter for general adult medical examination without abnormal findings: Secondary | ICD-10-CM | POA: Diagnosis not present

## 2016-04-02 LAB — URINALYSIS, ROUTINE W REFLEX MICROSCOPIC
Bilirubin Urine: NEGATIVE
Ketones, ur: NEGATIVE
Leukocytes, UA: NEGATIVE
Nitrite: NEGATIVE
Specific Gravity, Urine: 1.02 (ref 1.000–1.030)
Urine Glucose: NEGATIVE
Urobilinogen, UA: 0.2 (ref 0.0–1.0)
pH: 6 (ref 5.0–8.0)

## 2016-04-02 LAB — HEPATIC FUNCTION PANEL
ALT: 19 U/L (ref 0–35)
AST: 18 U/L (ref 0–37)
Albumin: 4.5 g/dL (ref 3.5–5.2)
Alkaline Phosphatase: 62 U/L (ref 39–117)
Bilirubin, Direct: 0.1 mg/dL (ref 0.0–0.3)
Total Bilirubin: 0.4 mg/dL (ref 0.2–1.2)
Total Protein: 7.6 g/dL (ref 6.0–8.3)

## 2016-04-02 LAB — LIPID PANEL
Cholesterol: 185 mg/dL (ref 0–200)
HDL: 81.1 mg/dL (ref >39.00)
LDL Cholesterol: 81 mg/dL (ref 0–99)
NonHDL: 103.63
Total CHOL/HDL Ratio: 2
Triglycerides: 113 mg/dL (ref 0.0–149.0)
VLDL: 22.6 mg/dL (ref 0.0–40.0)

## 2016-04-02 LAB — CBC WITH DIFFERENTIAL/PLATELET
Basophils Absolute: 0 10*3/uL (ref 0.0–0.1)
Basophils Relative: 0.5 % (ref 0.0–3.0)
Eosinophils Absolute: 0.3 10*3/uL (ref 0.0–0.7)
Eosinophils Relative: 3 % (ref 0.0–5.0)
HCT: 42.9 % (ref 36.0–46.0)
Hemoglobin: 14.4 g/dL (ref 12.0–15.0)
Lymphocytes Relative: 41.5 % (ref 12.0–46.0)
Lymphs Abs: 3.8 10*3/uL (ref 0.7–4.0)
MCHC: 33.7 g/dL (ref 30.0–36.0)
MCV: 97.9 fl (ref 78.0–100.0)
Monocytes Absolute: 0.8 10*3/uL (ref 0.1–1.0)
Monocytes Relative: 8.7 % (ref 3.0–12.0)
Neutro Abs: 4.3 10*3/uL (ref 1.4–7.7)
Neutrophils Relative %: 46.3 % (ref 43.0–77.0)
Platelets: 355 10*3/uL (ref 150.0–400.0)
RBC: 4.38 Mil/uL (ref 3.87–5.11)
RDW: 13.6 % (ref 11.5–15.5)
WBC: 9.3 10*3/uL (ref 4.0–10.5)

## 2016-04-02 LAB — BASIC METABOLIC PANEL
BUN: 17 mg/dL (ref 6–23)
CO2: 28 mEq/L (ref 19–32)
Calcium: 9.8 mg/dL (ref 8.4–10.5)
Chloride: 104 mEq/L (ref 96–112)
Creatinine, Ser: 0.71 mg/dL (ref 0.40–1.20)
GFR: 89.33 mL/min (ref >60.00)
Glucose, Bld: 94 mg/dL (ref 70–99)
Potassium: 4.2 mEq/L (ref 3.5–5.1)
Sodium: 139 mEq/L (ref 135–145)

## 2016-04-02 LAB — TSH: TSH: 0.99 u[IU]/mL (ref 0.35–4.50)

## 2016-04-03 ENCOUNTER — Other Ambulatory Visit: Payer: Self-pay | Admitting: Internal Medicine

## 2016-04-06 ENCOUNTER — Encounter: Payer: Self-pay | Admitting: Internal Medicine

## 2016-04-06 ENCOUNTER — Ambulatory Visit (INDEPENDENT_AMBULATORY_CARE_PROVIDER_SITE_OTHER): Payer: BLUE CROSS/BLUE SHIELD | Admitting: Internal Medicine

## 2016-04-06 VITALS — BP 122/74 | HR 87 | Temp 98.2°F | Resp 20 | Wt 147.0 lb

## 2016-04-06 DIAGNOSIS — Z1159 Encounter for screening for other viral diseases: Secondary | ICD-10-CM

## 2016-04-06 DIAGNOSIS — Z0000 Encounter for general adult medical examination without abnormal findings: Secondary | ICD-10-CM

## 2016-04-06 NOTE — Progress Notes (Signed)
Subjective:    Patient ID: Barbara Gross, female    DOB: Jul 20, 1956, 60 y.o.   MRN: [id number]  HPI  Here for wellness and f/u;  Overall doing ok;  Pt denies Chest pain, worsening SOB, DOE, wheezing, orthopnea, PND, worsening LE edema, palpitations, dizziness or syncope.  Pt denies neurological change such as new headache, facial or extremity weakness.  Pt denies polydipsia, polyuria, or low sugar symptoms. Pt states overall good compliance with treatment and medications, good tolerability, and has been trying to follow appropriate diet.  Pt denies worsening depressive symptoms, suicidal ideation or panic. No fever, night sweats, wt loss, loss of appetite, or other constitutional symptoms.  Pt states good ability with ADL's, has low fall risk, home safety reviewed and adequate, no other significant changes in hearing or vision, and only fairly active with exercise.  Tries to walk most days up to 2 miles, despite recurring LBP without change in severity, bowel or bladder change, fever, wt loss,  worsening LE pain/numbness/weakness, gait change or falls.  Has been doing better with gabapentin. Also left shoulder improved ROM after conservative tx for left shoulder and PT. Wt Readings from Last 3 Encounters:  04/06/16 147 lb (66.679 kg)  03/05/16 148 lb (67.132 kg)  02/20/16 147 lb (66.679 kg)  Has f/u colonoscopy sched for approx sept.  Past Medical History  Diagnosis Date  . HYPERLIPIDEMIA 05/31/2007  . ANXIETY 12/24/2007  . DEPRESSION 12/24/2007  . MIGRAINE HEADACHE 05/31/2007  . HYPERTENSION 05/31/2007  . DIVERTICULITIS OF COLON 04/21/2010  . Microscopic hematuria 01/03/2009  . BREAST MASS, LEFT 07/15/2008  . BACTERIAL VAGINITIS 12/24/2007  . MENOPAUSE, EARLY 01/04/2010  . ABDOMINAL PAIN, LOWER 05/22/2010   Past Surgical History  Procedure Laterality Date  . Abdominal hysterectomy    . Oophorectomy    . Colonoscopy    . Polypectomy    . Exploratory laparotomy      reports that she has  never smoked. She has never used smokeless tobacco. She reports that she drinks about 8.4 oz of alcohol per week. She reports that she does not use illicit drugs. family history includes Cancer in her other; Colon cancer in her other; Diabetes in her father; Hyperlipidemia in her father and mother; Hypertension in her father and mother; Melanoma in her other; Stroke in her other; Sudden death in her mother; Ulcers in her mother. Allergies  Allergen Reactions  . Codeine Nausea And Vomiting  . Hydrocodone Nausea And Vomiting  . Venlafaxine Nausea Only   Current Outpatient Prescriptions on File Prior to Visit  Medication Sig Dispense Refill  . amLODipine (NORVASC) 5 MG tablet TAKE 1 TABLET (5 MG TOTAL) BY MOUTH DAILY. 90 tablet 2  . aspirin 81 MG EC tablet Take 81 mg by mouth daily.      . fluticasone (FLONASE) 50 MCG/ACT nasal spray PLACE 2 SPRAYS INTO BOTH NOSTRILS DAILY. 16 g 4  . gabapentin (NEURONTIN) 100 MG capsule Take 2 capsules (200 mg total) by mouth at bedtime. 60 capsule 3  . irbesartan (AVAPRO) 75 MG tablet Take 1 tablet (75 mg total) by mouth daily. 90 tablet 3  . lovastatin (MEVACOR) 20 MG tablet Take 1 tablet (20 mg total) by mouth at bedtime. Yearly physical due in july 90 tablet 0  . meloxicam (MOBIC) 15 MG tablet Take 1 tablet (15 mg total) by mouth daily. As needed for pain 90 tablet 3  . tiZANidine (ZANAFLEX) 4 MG tablet Take 1 tablet (4 mg total) by  mouth Nightly. 30 tablet 2   No current facility-administered medications on file prior to visit.   Review of Systems Constitutional: Negative for increased diaphoresis, or other activity, appetite or siginficant weight change other than noted HENT: Negative for worsening hearing loss, ear pain, facial swelling, mouth sores and neck stiffness.   Eyes: Negative for other worsening pain, redness or visual disturbance.  Respiratory: Negative for choking or stridor Cardiovascular: Negative for other chest pain and palpitations.    Gastrointestinal: Negative for worsening diarrhea, blood in stool, or abdominal distention Genitourinary: Negative for hematuria, flank pain or change in urine volume.  Musculoskeletal: Negative for myalgias or other joint complaints.  Skin: Negative for other color change and wound or drainage.  Neurological: Negative for syncope and numbness. other than noted Hematological: Negative for adenopathy. or other swelling Psychiatric/Behavioral: Negative for hallucinations, SI, self-injury, decreased concentration or other worsening agitation.      Objective:   Physical Exam BP 122/74 mmHg  Pulse 87  Temp(Src) 98.2 F (36.8 C) (Oral)  Resp 20  Wt 147 lb (66.679 kg)  SpO2 97%' VS noted,  Constitutional: Pt is oriented to person, place, and time. Appears well-developed and well-nourished, in no significant distress Head: Normocephalic and atraumatic  Eyes: Conjunctivae and EOM are normal. Pupils are equal, round, and reactive to light Right Ear: External ear normal.  Left Ear: External ear normal Nose: Nose normal.  Mouth/Throat: Oropharynx is clear and moist  Neck: Normal range of motion. Neck supple. No JVD present. No tracheal deviation present or significant neck LA or mass Cardiovascular: Normal rate, regular rhythm, normal heart sounds and intact distal pulses.   Pulmonary/Chest: Effort normal and breath sounds without rales or wheezing  Abdominal: Soft. Bowel sounds are normal. NT. No HSM  Musculoskeletal: Normal range of motion. Exhibits no edema Lymphadenopathy: Has no cervical adenopathy.  Neurological: Pt is alert and oriented to person, place, and time. Pt has normal reflexes. No cranial nerve deficit. Motor grossly intact Skin: Skin is warm and dry. No rash noted or new ulcers Psychiatric:  Has normal mood and affect. Behavior is normal.     Assessment & Plan:

## 2016-04-06 NOTE — Patient Instructions (Signed)
Please continue all other medications as before, and refills have been done if requested.  Please have the pharmacy call with any other refills you may need.  Please continue your efforts at being more active, low cholesterol diet, and weight control.  You are otherwise up to date with prevention measures today.  Please keep your appointments with your specialists as you may have planned  Please return in 1 year for your yearly visit, or sooner if needed, with Lab testing done 3-5 days before  

## 2016-04-06 NOTE — Progress Notes (Signed)
Pre visit review using our clinic review tool, if applicable. No additional management support is needed unless otherwise documented below in the visit note. 

## 2016-04-08 NOTE — Assessment & Plan Note (Signed)

## 2016-04-16 ENCOUNTER — Ambulatory Visit (INDEPENDENT_AMBULATORY_CARE_PROVIDER_SITE_OTHER): Payer: BLUE CROSS/BLUE SHIELD | Admitting: Family Medicine

## 2016-04-16 ENCOUNTER — Encounter: Payer: Self-pay | Admitting: Family Medicine

## 2016-04-16 VITALS — BP 128/82 | HR 86 | Wt 148.0 lb

## 2016-04-16 DIAGNOSIS — M5416 Radiculopathy, lumbar region: Secondary | ICD-10-CM | POA: Diagnosis not present

## 2016-04-16 MED ORDER — GABAPENTIN 300 MG PO CAPS: 300.0000 mg | ORAL_CAPSULE | Freq: Every day | ORAL | Status: DC

## 2016-04-16 NOTE — Assessment & Plan Note (Signed)
Improving at this time. Discussed with patient going great length. We will increase patient's gabapentin at this time. Encourage her to continue the home exercises and we discussed ergonomics at work. Discussed proper shoes. Patient will continue to remain active and I like to see her again in 2 months for further evaluation and treatment.

## 2016-04-16 NOTE — Patient Instructions (Signed)
Keep it up, you are doing so much better.  Ice still when it hurts Increase gabapentin to 300mg  at night I like the standing desk, change every 2 hours.  Consider an anti fatigue mat when standing.  See me again in 2 months!

## 2016-04-16 NOTE — Progress Notes (Signed)
[name] [address], [place] [address] Phone: [phone] Subjective:     CC: Right sided low back pain  [id number] Barbara Gross is a 60 y.o. female coming in with complaint of right-sided low back pain. Patient does have a past medical history significant for degenerative disc disease of the lumbar spine. Patient was having worsening symptoms. Patient did have repeat imaging. X-rays were independently visualized by me. X-ray show that patient did have some progression of arthritis in multiple levels especially between L2-L3.  Patient is making improvement. Patient states that she is 75% better. Feels that the gabapentin has been helpful. Also has a standing desk at night. Doing the exercises on a more regular basis. Very happy.   previous imaging studies were reviewed and independently visualized by me. 2012 MRI showed the patient did have some facet arthropathy at L4-L5 mostly on the left side and mild osteoarthritic changes at multiple levels.    Past Medical History  Diagnosis Date  . HYPERLIPIDEMIA 05/31/2007  . ANXIETY 12/24/2007  . DEPRESSION 12/24/2007  . MIGRAINE HEADACHE 05/31/2007  . HYPERTENSION 05/31/2007  . DIVERTICULITIS OF COLON 04/21/2010  . Microscopic hematuria 01/03/2009  . BREAST MASS, LEFT 07/15/2008  . BACTERIAL VAGINITIS 12/24/2007  . MENOPAUSE, EARLY 01/04/2010  . ABDOMINAL PAIN, LOWER 05/22/2010   Past Surgical History  Procedure Laterality Date  . Abdominal hysterectomy    . Oophorectomy    . Colonoscopy    . Polypectomy    . Exploratory laparotomy     Social History   Social History  . Marital Status: Single    Spouse Name: N/A  . Number of Children: N/A  . Years of Education: N/A   Occupational History  . 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 Smoking status: Never Smoker   . Smokeless tobacco: Never Used  . Alcohol Use: 8.4 oz/week    14 Glasses of wine per week   Comment: 2 glasses of wine a night  . Drug Use: No  . Sexual Activity: Not Asked   Other Topics Concern  . None   Social History Narrative   Allergies  Allergen Reactions  . Codeine Nausea And Vomiting  . Hydrocodone Nausea And Vomiting  . Venlafaxine Nausea Only   Family History  Problem Relation Age of Onset  . Hypertension Mother   . Ulcers Mother     bleeding  . Hyperlipidemia Mother   . Sudden death Mother     bleeding ulcer 1995  . Diabetes Father   . Hypertension Father   . Hyperlipidemia Father   . Melanoma Other   . Colon cancer Other     dx'd in 74's  . Stroke Other   . Cancer Other     prostate    Past medical history, social, surgical and family history all reviewed in electronic medical record.  No pertanent information unless stated regarding to the chief complaint.   Review of Systems: No headache, visual changes, nausea, vomiting, diarrhea, constipation, dizziness, abdominal pain, skin rash, fevers, chills, night sweats, weight loss, swollen lymph nodes, chest pain, shortness of breath, mood changes.   Objective Blood pressure 128/82, pulse 86, weight 148 lb (67.132 kg).  General: No apparent distress alert and oriented x3 mood and affect normal, dressed appropriately.  HEENT: Pupils equal, extraocular movements intact  Respiratory: Patient's speak in full sentences and does not appear short of breath  Cardiovascular: No lower  extremity edema, non tender, no erythema  Skin: Warm dry intact with no signs of infection or rash on extremities or on axial skeleton.  Abdomen: Soft nontender  Neuro: Cranial nerves II through XII are intact, neurovascularly intact in all extremities with 2+ DTRs and 2+ pulses.  Lymph: No lymphadenopathy of posterior or anterior cervical chain or axillae bilaterally.  Gait normal with good balance and coordination.  MSK:  Non tender with full range of motion and good stability and symmetric strength and tone of shoulders,  elbows, wrist, hip, knee and ankles bilaterally. Mild arthritic changes of multiple joints  Back Exam:  Inspection: Unremarkable  Motion: Flexion 35 deg, Extension 25deg, Side Bending to 25 deg bilaterally,  Rotation to 35 deg bilaterally  SLR laying: Negative  XSLR laying: Negative  Palpable tenderness: Minimal pain over the per, symmetrical lumbar spine FABER: Negative Sensory change: Gross sensation intact to all lumbar and sacral dermatomes.  Reflexes: 2+ at both patellar tendons, 2+ at achilles tendons, Babinski's downgoing.  Strength at foot  Plantar-flexion: 5/5 Dorsi-flexion: 5/5 Eversion: 5/5 Inversion: 5/5  Leg strength  Quad: 5/5 Hamstring: 5/5 Hip flexor: 5/5 Hip abductors: 4+5  Gait unremarkable. Improvement from previous exam     Impression and Recommendations:     This case required medical decision making of moderate complexity.      Note: This dictation was prepared with Dragon dictation along with smaller phrase technology. Any transcriptional errors that result from this process are unintentional.

## 2016-05-14 ENCOUNTER — Ambulatory Visit (AMBULATORY_SURGERY_CENTER): Payer: Self-pay | Admitting: *Deleted

## 2016-05-14 ENCOUNTER — Other Ambulatory Visit: Payer: Self-pay | Admitting: Internal Medicine

## 2016-05-14 VITALS — Ht 65.0 in | Wt 148.0 lb

## 2016-05-14 DIAGNOSIS — Z86010 Personal history of colonic polyps: Secondary | ICD-10-CM

## 2016-05-14 MED ORDER — NA SULFATE-K SULFATE-MG SULF 17.5-3.13-1.6 GM/177ML PO SOLN: 1.0000 | Freq: Once | ORAL | 0 refills | Status: AC

## 2016-05-14 NOTE — Progress Notes (Signed)
No egg or soy allergy known to patient   issues with past sedation with any surgeries  or procedures, no intubation problems -pt has hx post op N/V  No diet pills per patient No home 02 use per patient  No blood thinners per patient  Pt denies issues with constipation

## 2016-05-28 ENCOUNTER — Encounter: Payer: BLUE CROSS/BLUE SHIELD | Admitting: Internal Medicine

## 2016-06-06 ENCOUNTER — Other Ambulatory Visit: Payer: Self-pay | Admitting: Internal Medicine

## 2016-06-13 ENCOUNTER — Encounter: Payer: Self-pay | Admitting: Internal Medicine

## 2016-06-16 NOTE — Progress Notes (Signed)
[name] [address], [place] [address] Phone: [phone] Subjective:     CC: Right sided low back pain  [id number]  Barbara Gross is a 60 y.o. female coming in with complaint of right-sided low back pain. Patient does have a past medical history significant for degenerative disc disease of the lumbar spine. Patient was having worsening symptoms. Patient did have repeat imaging. X-rays were independently visualized by me. X-ray show that patient did have some progression of arthritis in multiple levels especially between L2-L3.  Patient was last seen 2 months ago and was doing 75% better. Patient states Still doing approximately 75% better. States it continues to have pain. Denies any numbness or tingling. Continues see muscle relaxer as needed as well as gabapentin regularly. Patient was to increase her gabapentin at 300 mg at night. Patient was also to get a standing desk.   previous imaging studies were reviewed and independently visualized by me. 2012 MRI showed the patient did have some facet arthropathy at L4-L5 mostly on the left side and mild osteoarthritic changes at multiple levels.    Past Medical History:  Diagnosis Date  . ABDOMINAL PAIN, LOWER 05/22/2010  . Allergy    pollen   . ANXIETY 12/24/2007  . Arthritis    back , shoulder  . BACTERIAL VAGINITIS 12/24/2007  . BREAST MASS, LEFT 07/15/2008  . DEPRESSION 12/24/2007  . DIVERTICULITIS OF COLON 04/21/2010  . HYPERLIPIDEMIA 05/31/2007  . HYPERTENSION 05/31/2007  . Hypertension   . MENOPAUSE, EARLY 01/04/2010  . Microscopic hematuria 01/03/2009  . MIGRAINE HEADACHE 05/31/2007   Past Surgical History:  Procedure Laterality Date  . ABDOMINAL HYSTERECTOMY    . COLONOSCOPY    . EXPLORATORY LAPAROTOMY    . OOPHORECTOMY    . POLYPECTOMY     Social History   Social History  . Marital status: Single    Spouse name: N/A  . Number of children: N/A  . Years of education: N/A    Occupational History  . 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 Smoking status: Former Research scientist (life sciences)  . Smokeless tobacco: Never Used     Comment: quit in the 80's  . Alcohol use 8.4 oz/week    14 Glasses of wine per week     Comment: 2 glasses of wine a night  . Drug use: No  . Sexual activity: Not Asked   Other Topics Concern  . None   Social History Narrative  . None   Allergies  Allergen Reactions  . Codeine Nausea And Vomiting  . Hydrocodone Nausea And Vomiting  . Venlafaxine Nausea Only   Family History  Problem Relation Age of Onset  . Hypertension Mother   . Ulcers Mother     bleeding  . Hyperlipidemia Mother   . Sudden death Mother     bleeding ulcer 1995  . Diabetes Father   . Hypertension Father   . Hyperlipidemia Father   . Colon polyps Father   . Cancer Other     prostate  . Melanoma Other   . Colon cancer Other     dx'd in 15's  . Stroke Other   . Colon cancer Paternal Grandmother   . Esophageal cancer Neg Hx   . Rectal cancer Neg Hx   . Stomach cancer Neg Hx     Past medical history, social, surgical and family history all reviewed in electronic medical record.  No pertanent  information unless stated regarding to the chief complaint.   Review of Systems: No headache, visual changes, nausea, vomiting, diarrhea, constipation, dizziness, abdominal pain, skin rash, fevers, chills, night sweats, weight loss, swollen lymph nodes, chest pain, shortness of breath, mood changes.   Objective  Blood pressure 130/82, pulse 73, weight 148 lb (67.1 kg), SpO2 96 %.  General: No apparent distress alert and oriented x3 mood and affect normal, dressed appropriately.  HEENT: Pupils equal, extraocular movements intact  Respiratory: Patient's speak in full sentences and does not appear short of breath  Cardiovascular: No lower extremity edema, non tender, no erythema  Skin: Warm dry intact with no signs of infection or rash on extremities  or on axial skeleton.  Abdomen: Soft nontender  Neuro: Cranial nerves II through XII are intact, neurovascularly intact in all extremities with 2+ DTRs and 2+ pulses.  Lymph: No lymphadenopathy of posterior or anterior cervical chain or axillae bilaterally.  Gait normal with good balance and coordination.  MSK:  Non tender with full range of motion and good stability and symmetric strength and tone of shoulders, elbows, wrist, hip, knee and ankles bilaterally. Mild arthritic changes of multiple joints  Back Exam:  Inspection: Unremarkable  Motion: Flexion 35 deg, Extension 25 deg, Side Bending to 25 deg bilaterally,  Rotation to 35 deg bilaterally  SLR laying: Negative  XSLR laying: Negative  Palpable tenderness: Very minimal tenderness in the paraspinal musculature of the lumbar spine FABER: Negative Sensory change: Gross sensation intact to all lumbar and sacral dermatomes.  Reflexes: 2+ at both patellar tendons, 2+ at achilles tendons, Babinski's downgoing.  Strength at foot  Plantar-flexion: 5/5 Dorsi-flexion: 5/5 Eversion: 5/5 Inversion: 5/5  Leg strength  Quad: 5/5 Hamstring: 5/5 Hip flexor: 5/5 Hip abductors: 4+5  Gait unremarkable. Same as previous exam      Impression and Recommendations:     This case required medical decision making of moderate complexity.      Note: This dictation was prepared with Dragon dictation along with smaller phrase technology. Any transcriptional errors that result from this process are unintentional.

## 2016-06-18 ENCOUNTER — Encounter: Payer: Self-pay | Admitting: Family Medicine

## 2016-06-18 ENCOUNTER — Ambulatory Visit (INDEPENDENT_AMBULATORY_CARE_PROVIDER_SITE_OTHER): Payer: Managed Care, Other (non HMO) | Admitting: Family Medicine

## 2016-06-18 DIAGNOSIS — M545 Low back pain, unspecified: Secondary | ICD-10-CM

## 2016-06-18 MED ORDER — GABAPENTIN 400 MG PO CAPS: 400.0000 mg | ORAL_CAPSULE | Freq: Every day | ORAL | 1 refills | Status: DC

## 2016-06-18 NOTE — Patient Instructions (Signed)
God to see you We will increase gabapentin to 400mg  at night Continue everything else.  Overall you are doing well.  Send me a message in 4 weeks and tell me how you are doing Any pain going down your legs or weakness then call me immediately.

## 2016-06-18 NOTE — Assessment & Plan Note (Signed)
Patient does have some degenerative disc disease. No significant radicular symptoms at that time. We will continue to monitor and continue the conservative therapy. Increase patient's gabapentin to 400 mg at night with her seeming to improve with this medication. Wondering to avoid daytime doesn't. If worsening symptoms consider Effexor for further evaluation with imaging.  Spent  25 minutes with patient face-to-face and had greater than 50% of counseling including as described above in assessment and plan.

## 2016-06-22 ENCOUNTER — Ambulatory Visit (AMBULATORY_SURGERY_CENTER): Payer: Managed Care, Other (non HMO) | Admitting: Internal Medicine

## 2016-06-22 ENCOUNTER — Encounter: Payer: Self-pay | Admitting: Internal Medicine

## 2016-06-22 VITALS — BP 143/91 | HR 82 | Temp 98.6°F | Resp 20 | Ht 65.0 in | Wt 148.0 lb

## 2016-06-22 DIAGNOSIS — K635 Polyp of colon: Secondary | ICD-10-CM | POA: Diagnosis not present

## 2016-06-22 DIAGNOSIS — D125 Benign neoplasm of sigmoid colon: Secondary | ICD-10-CM | POA: Diagnosis not present

## 2016-06-22 DIAGNOSIS — D123 Benign neoplasm of transverse colon: Secondary | ICD-10-CM | POA: Diagnosis not present

## 2016-06-22 DIAGNOSIS — Z86010 Personal history of colonic polyps: Secondary | ICD-10-CM

## 2016-06-22 MED ORDER — SODIUM CHLORIDE 0.9 % IV SOLN: 500.0000 mL | INTRAVENOUS | Status: DC

## 2016-06-22 NOTE — Progress Notes (Signed)
Report to PACU, RN, vss, BBS= Clear.  

## 2016-06-22 NOTE — Patient Instructions (Signed)
YOU HAD AN ENDOSCOPIC PROCEDURE TODAY AT [facility]:   Refer to the procedure report that was given to you for any specific questions about what was found during the examination.  If the procedure report does not answer your questions, please call your gastroenterologist to clarify.  If you requested that your care partner not be given the details of your procedure findings, then the procedure report has been included in a sealed envelope for you to review at your convenience later.  YOU SHOULD EXPECT: Some feelings of bloating in the abdomen. Passage of more gas than usual.  Walking can help get rid of the air that was put into your GI tract during the procedure and reduce the bloating. If you had a lower endoscopy (such as a colonoscopy or flexible sigmoidoscopy) you may notice spotting of blood in your stool or on the toilet paper. If you underwent a bowel prep for your procedure, you may not have a normal bowel movement for a few days.  Please Note:  You might notice some irritation and congestion in your nose or some drainage.  This is from the oxygen used during your procedure.  There is no need for concern and it should clear up in a day or so.  SYMPTOMS TO REPORT IMMEDIATELY:   Following lower endoscopy (colonoscopy or flexible sigmoidoscopy):  Excessive amounts of blood in the stool  Significant tenderness or worsening of abdominal pains  Swelling of the abdomen that is new, acute  Fever of 100F or higher   For urgent or emergent issues, a gastroenterologist can be reached at any hour by calling [phone].   DIET:  We do recommend a small meal at first, but then you may proceed to your regular diet.  Drink plenty of fluids but you should avoid alcoholic beverages for 24 hours.  Try to increase the fiber in your diet due to your Diverticulosis.  Drink plenty of water.  ACTIVITY:  You should plan to take it easy for the rest of today and you should NOT DRIVE or use  heavy machinery until tomorrow (because of the sedation medicines used during the test).    FOLLOW UP: Our staff will call the number listed on your records the next business day following your procedure to check on you and address any questions or concerns that you may have regarding the information given to you following your procedure. If we do not reach you, we will leave a message.  However, if you are feeling well and you are not experiencing any problems, there is no need to return our call.  We will assume that you have returned to your regular daily activities without incident.  If any biopsies were taken you will be contacted by phone or by letter within the next 1-3 weeks.  Please call us at [phone] if you have not heard about the biopsies in 3 weeks.    SIGNATURES/CONFIDENTIALITY: You and/or your care partner have signed paperwork which will be entered into your electronic medical record.  These signatures attest to the fact that that the information above on your After Visit Summary has been reviewed and is understood.  Full responsibility of the confidentiality of this discharge information lies with you and/or your care-partner.  Read all of the instructions given to you by your recovery room nurse.  Thank-you for choosing [place] for your healthcare needs today.

## 2016-06-22 NOTE — Progress Notes (Signed)
Called to room to assist during endoscopic procedure.  Patient ID and intended procedure confirmed with present staff. Received instructions for my participation in the procedure from the performing physician.  

## 2016-06-22 NOTE — Op Note (Signed)
[facility] Patient Name: Barbara Gross Procedure Date: 06/22/2016 9:07 AM MRN: [id number] Endoscopist: Jerene Bears , MD Age: 60 Referring MD:  Date of Birth: [birth year] Gender: Female Account #: [id number] Procedure:                Colonoscopy Indications:              Surveillance: Personal history of adenomatous                            polyps on last colonoscopy 3 years ago Medicines:                Monitored Anesthesia Care Procedure:                Pre-Anesthesia Assessment:                           - Prior to the procedure, a History and Physical                            was performed, and patient medications and                            allergies were reviewed. The patient's tolerance of                            previous anesthesia was also reviewed. The risks                            and benefits of the procedure and the sedation                            options and risks were discussed with the patient.                            All questions were answered, and informed consent                            was obtained. Prior Anticoagulants: The patient has                            taken no previous anticoagulant or antiplatelet                            agents. ASA Grade Assessment: II - A patient with                            mild systemic disease. After reviewing the risks                            and benefits, the patient was deemed in                            satisfactory condition to undergo the procedure.  After obtaining informed consent, the colonoscope                            was passed under direct vision. Throughout the                            procedure, the patient's blood pressure, pulse, and                            oxygen saturations were monitored continuously. The                            Model PCF-H190L [id number]) scope was introduced                            through the anus and  advanced to the the cecum,                            identified by appendiceal orifice and ileocecal                            valve. The colonoscopy was performed without                            difficulty. The patient tolerated the procedure                            well. The quality of the bowel preparation was                            good. The ileocecal valve, appendiceal orifice, and                            rectum were photographed. Scope In: 9:13:47 AM Scope Out: 9:33:43 AM Scope Withdrawal Time: 0 hours 15 minutes 0 seconds  Total Procedure Duration: 0 hours 19 minutes 56 seconds  Findings:                 The digital rectal exam was normal.                           A 4 mm polyp was found in the hepatic flexure. The                            polyp was sessile. The polyp was removed with a                            cold snare. Resection and retrieval were complete.                           A 5 mm polyp was found in the sigmoid colon near a                            diverticular opening (  likely hyperplastic or                            inflammatory change related to the diverticuli).                            The polyp was sessile. The polyp was removed with a                            cold snare. Resection and retrieval were complete.                           Multiple small and large-mouthed diverticula were                            found in the entire colon. Erythema with restricted                            mobility was seen in association with the                            diverticular opening in the distal sigmoid and                            rectosigmoid colon.                           Internal hemorrhoids were found during                            retroflexion. The hemorrhoids were small. Complications:            No immediate complications. Estimated Blood Loss:     Estimated blood loss was minimal. Impression:               - One 4 mm polyp at  the hepatic flexure, removed                            with a cold snare. Resected and retrieved.                           - One 5 mm polyp in the sigmoid colon, removed with                            a cold snare. Resected and retrieved.                           - Moderate to servere diverticulosis in the entire                            examined colon. Erythema was seen in association                            with the diverticular opening in sigmoid and  rectosigmoid colon.                           - Internal hemorrhoids. Recommendation:           - Patient has a contact number available for                            emergencies. The signs and symptoms of potential                            delayed complications were discussed with the                            patient. Return to normal activities tomorrow.                            Written discharge instructions were provided to the                            patient.                           - Resume previous diet.                           - Continue present medications.                           - Await pathology results.                           - Repeat colonoscopy is recommended for                            surveillance. The colonoscopy date will be                            determined after pathology results from today's                            exam become available for review. Jerene Bears, MD 06/22/2016 9:39:14 AM This report has been signed electronically.

## 2016-06-25 ENCOUNTER — Telehealth: Payer: Self-pay

## 2016-06-25 NOTE — Telephone Encounter (Signed)
  Follow up Call-  Call back number 06/22/2016  Post procedure Call Back phone  # [phone]  Permission to leave phone message Yes  Some recent data might be hidden     Patient questions:  Do you have a fever, pain , or abdominal swelling? No. Pain Score  0 *  Have you tolerated food without any problems? Yes.    Have you been able to return to your normal activities? Yes.    Do you have any questions about your discharge instructions: Diet   No. Medications  No. Follow up visit  No.  Do you have questions or concerns about your Care? No.  Actions: * If pain score is 4 or above: No action needed, pain <4.

## 2016-06-25 NOTE — Telephone Encounter (Signed)
  Follow up Call-  Call back number 06/22/2016  Post procedure Call Back phone  # [phone]  Permission to leave phone message Yes  Some recent data might be hidden     Patient was called for follow up after her procedure on 06/22/2016. No answer at the number given for follow up phone call. A message was left on the answering machine.

## 2016-06-28 ENCOUNTER — Encounter: Payer: Self-pay | Admitting: Internal Medicine

## 2016-07-03 ENCOUNTER — Encounter: Payer: Self-pay | Admitting: Internal Medicine

## 2016-07-20 ENCOUNTER — Encounter: Payer: Self-pay | Admitting: Internal Medicine

## 2016-07-24 ENCOUNTER — Encounter: Payer: Self-pay | Admitting: Family Medicine

## 2016-10-01 HISTORY — PX: COLONOSCOPY: SHX174

## 2016-10-01 HISTORY — PX: POLYPECTOMY: SHX149

## 2016-12-11 ENCOUNTER — Other Ambulatory Visit: Payer: Self-pay | Admitting: Family Medicine

## 2016-12-30 ENCOUNTER — Other Ambulatory Visit: Payer: Self-pay | Admitting: Internal Medicine

## 2017-01-03 ENCOUNTER — Telehealth: Payer: Self-pay | Admitting: Internal Medicine

## 2017-01-03 NOTE — Telephone Encounter (Signed)
Pt called and scheduled a CPE with Dr Jenny Reichmann in July. She would like to see if lab orders could be put in so that she can go a few days before her appointment to have them done.

## 2017-01-03 NOTE — Telephone Encounter (Signed)
I looked in her chart and she already has orders in place. She can have those done 3-5 days before her appt.

## 2017-03-02 ENCOUNTER — Other Ambulatory Visit: Payer: Self-pay | Admitting: Internal Medicine

## 2017-03-08 ENCOUNTER — Other Ambulatory Visit: Payer: Self-pay | Admitting: Family Medicine

## 2017-03-08 NOTE — Telephone Encounter (Signed)
Refill done.  

## 2017-03-18 LAB — HM MAMMOGRAPHY

## 2017-03-27 ENCOUNTER — Other Ambulatory Visit: Payer: Self-pay | Admitting: Internal Medicine

## 2017-03-30 ENCOUNTER — Other Ambulatory Visit: Payer: Self-pay | Admitting: Internal Medicine

## 2017-04-02 ENCOUNTER — Other Ambulatory Visit (INDEPENDENT_AMBULATORY_CARE_PROVIDER_SITE_OTHER): Payer: Managed Care, Other (non HMO)

## 2017-04-02 DIAGNOSIS — Z0000 Encounter for general adult medical examination without abnormal findings: Secondary | ICD-10-CM | POA: Diagnosis not present

## 2017-04-02 DIAGNOSIS — Z1159 Encounter for screening for other viral diseases: Secondary | ICD-10-CM

## 2017-04-02 LAB — URINALYSIS, ROUTINE W REFLEX MICROSCOPIC
Bilirubin Urine: NEGATIVE
Leukocytes, UA: NEGATIVE
Nitrite: NEGATIVE
Specific Gravity, Urine: 1.015 (ref 1.000–1.030)
Total Protein, Urine: NEGATIVE
Urine Glucose: NEGATIVE
Urobilinogen, UA: 0.2 (ref 0.0–1.0)
pH: 6 (ref 5.0–8.0)

## 2017-04-02 LAB — CBC WITH DIFFERENTIAL/PLATELET
Basophils Absolute: 0 10*3/uL (ref 0.0–0.1)
Basophils Relative: 0.3 % (ref 0.0–3.0)
Eosinophils Absolute: 0.3 10*3/uL (ref 0.0–0.7)
Eosinophils Relative: 3.4 % (ref 0.0–5.0)
HCT: 43.1 % (ref 36.0–46.0)
Hemoglobin: 14.6 g/dL (ref 12.0–15.0)
Lymphocytes Relative: 38.7 % (ref 12.0–46.0)
Lymphs Abs: 3.3 10*3/uL (ref 0.7–4.0)
MCHC: 34 g/dL (ref 30.0–36.0)
MCV: 99 fl (ref 78.0–100.0)
Monocytes Absolute: 0.9 10*3/uL (ref 0.1–1.0)
Monocytes Relative: 10.1 % (ref 3.0–12.0)
Neutro Abs: 4 10*3/uL (ref 1.4–7.7)
Neutrophils Relative %: 47.5 % (ref 43.0–77.0)
Platelets: 344 10*3/uL (ref 150.0–400.0)
RBC: 4.35 Mil/uL (ref 3.87–5.11)
RDW: 13 % (ref 11.5–15.5)
WBC: 8.5 10*3/uL (ref 4.0–10.5)

## 2017-04-02 LAB — BASIC METABOLIC PANEL
BUN: 16 mg/dL (ref 6–23)
CO2: 27 mEq/L (ref 19–32)
Calcium: 10 mg/dL (ref 8.4–10.5)
Chloride: 103 mEq/L (ref 96–112)
Creatinine, Ser: 0.68 mg/dL (ref 0.40–1.20)
GFR: 93.58 mL/min (ref >60.00)
Glucose, Bld: 100 mg/dL — ABNORMAL HIGH (ref 70–99)
Potassium: 4.3 mEq/L (ref 3.5–5.1)
Sodium: 140 mEq/L (ref 135–145)

## 2017-04-02 LAB — HEPATIC FUNCTION PANEL
ALT: 21 U/L (ref 0–35)
AST: 20 U/L (ref 0–37)
Albumin: 4.5 g/dL (ref 3.5–5.2)
Alkaline Phosphatase: 56 U/L (ref 39–117)
Bilirubin, Direct: 0.1 mg/dL (ref 0.0–0.3)
Total Bilirubin: 0.5 mg/dL (ref 0.2–1.2)
Total Protein: 7.6 g/dL (ref 6.0–8.3)

## 2017-04-02 LAB — LIPID PANEL
Cholesterol: 172 mg/dL (ref 0–200)
HDL: 67.3 mg/dL (ref >39.00)
LDL Cholesterol: 73 mg/dL (ref 0–99)
NonHDL: 104.26
Total CHOL/HDL Ratio: 3
Triglycerides: 155 mg/dL — ABNORMAL HIGH (ref 0.0–149.0)
VLDL: 31 mg/dL (ref 0.0–40.0)

## 2017-04-02 LAB — TSH: TSH: 0.92 u[IU]/mL (ref 0.35–4.50)

## 2017-04-02 LAB — HEPATITIS C ANTIBODY: HCV Ab: NEGATIVE

## 2017-04-09 ENCOUNTER — Ambulatory Visit (INDEPENDENT_AMBULATORY_CARE_PROVIDER_SITE_OTHER): Payer: Managed Care, Other (non HMO) | Admitting: Internal Medicine

## 2017-04-09 ENCOUNTER — Encounter: Payer: Self-pay | Admitting: Internal Medicine

## 2017-04-09 VITALS — BP 132/86 | HR 83 | Ht 65.0 in | Wt 149.0 lb

## 2017-04-09 DIAGNOSIS — M19049 Primary osteoarthritis, unspecified hand: Secondary | ICD-10-CM | POA: Insufficient documentation

## 2017-04-09 DIAGNOSIS — M545 Other chronic pain: Secondary | ICD-10-CM

## 2017-04-09 DIAGNOSIS — Z0001 Encounter for general adult medical examination with abnormal findings: Secondary | ICD-10-CM | POA: Diagnosis not present

## 2017-04-09 DIAGNOSIS — M19041 Primary osteoarthritis, right hand: Secondary | ICD-10-CM | POA: Diagnosis not present

## 2017-04-09 DIAGNOSIS — G8929 Other chronic pain: Secondary | ICD-10-CM | POA: Diagnosis not present

## 2017-04-09 DIAGNOSIS — F329 Major depressive disorder, single episode, unspecified: Secondary | ICD-10-CM

## 2017-04-09 DIAGNOSIS — I10 Essential (primary) hypertension: Secondary | ICD-10-CM

## 2017-04-09 DIAGNOSIS — Z0000 Encounter for general adult medical examination without abnormal findings: Secondary | ICD-10-CM

## 2017-04-09 DIAGNOSIS — F32A Depression, unspecified: Secondary | ICD-10-CM

## 2017-04-09 DIAGNOSIS — M19042 Primary osteoarthritis, left hand: Secondary | ICD-10-CM

## 2017-04-09 DIAGNOSIS — Z00121 Encounter for routine child health examination with abnormal findings: Secondary | ICD-10-CM

## 2017-04-09 MED ORDER — AMLODIPINE BESYLATE 5 MG PO TABS: 5.0000 mg | ORAL_TABLET | Freq: Every day | ORAL | 3 refills | Status: DC

## 2017-04-09 MED ORDER — CITALOPRAM HYDROBROMIDE 10 MG PO TABS: 10.0000 mg | ORAL_TABLET | Freq: Every day | ORAL | 3 refills | Status: DC

## 2017-04-09 MED ORDER — DICLOFENAC SODIUM 1 % TD GEL: 4.0000 g | Freq: Four times a day (QID) | TRANSDERMAL | 5 refills | Status: DC | PRN

## 2017-04-09 MED ORDER — LOVASTATIN 20 MG PO TABS: 20.0000 mg | ORAL_TABLET | Freq: Every day | ORAL | 3 refills | Status: DC

## 2017-04-09 MED ORDER — MELOXICAM 15 MG PO TABS: 15.0000 mg | ORAL_TABLET | Freq: Every day | ORAL | 3 refills | Status: DC

## 2017-04-09 MED ORDER — GABAPENTIN 100 MG PO CAPS: ORAL_CAPSULE | ORAL | 1 refills | Status: DC

## 2017-04-09 MED ORDER — FLUTICASONE PROPIONATE 50 MCG/ACT NA SUSP: NASAL | 5 refills | Status: DC

## 2017-04-09 MED ORDER — IRBESARTAN 75 MG PO TABS: 75.0000 mg | ORAL_TABLET | Freq: Every day | ORAL | 3 refills | Status: DC

## 2017-04-09 MED ORDER — TIZANIDINE HCL 4 MG PO TABS: 4.0000 mg | ORAL_TABLET | Freq: Every evening | ORAL | 3 refills | Status: DC

## 2017-04-09 NOTE — Patient Instructions (Signed)
Please take all new medication as prescribed - the gaabapentin 100 mg twice during the daytime hours (and continue the 400 mg at bedtime)  Please take all new medication as prescribed - the generic Voltaren gel as needed for hand pain  Please take all new medication as prescribed - the celexa 10 mg per day for low mood and stress  Please continue all other medications as before, and refills have been done if requested.  Please have the pharmacy call with any other refills you may need.  Please continue your efforts at being more active, low cholesterol diet, and weight control.  You are otherwise up to date with prevention measures today.  Please keep your appointments with your specialists as you may have planned  Please return in 1 year for your yearly visit, or sooner if needed, with Lab testing done 3-5 days before

## 2017-04-09 NOTE — Progress Notes (Addendum)
Subjective:    Patient ID: Barbara Gross, female    DOB: 31-Oct-1955, 61 y.o.   MRN: [id number]  HPI  Here for wellness and f/u;  Overall doing ok;  Pt denies Chest pain, worsening SOB, DOE, wheezing, orthopnea, PND, worsening LE edema, palpitations, dizziness or syncope.  Pt denies neurological change such as new headache, facial or extremity weakness.  Pt denies polydipsia, polyuria, or low sugar symptoms. Pt states overall good compliance with treatment and medications, good tolerability, and has been trying to follow appropriate diet.  Pt has had mild worsening depressive symptoms, but no suicidal ideation or panic. No fever, night sweats, wt loss, loss of appetite, or other constitutional symptoms.  Pt states good ability with ADL's, has low fall risk, home safety reviewed and adequate, no other significant changes in hearing or vision, and occasionally active with exercise.  Does have bilat hand degenerative pain on daily basis, which is quite bothersome as she conts to work daily.  Pt continues to have recurring right LBP without change in severity, bowel or bladder change, fever, wt loss,  worsening LE /numbness/weakness, gait change or falls. Past Medical History:  Diagnosis Date  . ABDOMINAL PAIN, LOWER 05/22/2010  . Allergy    pollen   . ANXIETY 12/24/2007  . Arthritis    back , shoulder  . BACTERIAL VAGINITIS 12/24/2007  . BREAST MASS, LEFT 07/15/2008  . DEPRESSION 12/24/2007  . DIVERTICULITIS OF COLON 04/21/2010  . HYPERLIPIDEMIA 05/31/2007  . HYPERTENSION 05/31/2007  . Hypertension   . MENOPAUSE, EARLY 01/04/2010  . Microscopic hematuria 01/03/2009  . MIGRAINE HEADACHE 05/31/2007   Past Surgical History:  Procedure Laterality Date  . ABDOMINAL HYSTERECTOMY    . COLONOSCOPY    . EXPLORATORY LAPAROTOMY    . OOPHORECTOMY    . POLYPECTOMY      reports that she has quit smoking. She has never used smokeless tobacco. She reports that she drinks about 8.4 oz of alcohol per week . She  reports that she does not use drugs. family history includes Cancer in her other; Colon cancer in her other and paternal grandmother; Colon polyps in her father; Diabetes in her father; Hyperlipidemia in her father and mother; Hypertension in her father and mother; Melanoma in her other; Stroke in her other; Sudden death in her mother; Ulcers in her mother. Allergies  Allergen Reactions  . Codeine Nausea And Vomiting  . Hydrocodone Nausea And Vomiting  . Venlafaxine Nausea Only   Current Outpatient Prescriptions on File Prior to Visit  Medication Sig Dispense Refill  . aspirin 81 MG EC tablet Take 81 mg by mouth daily.      Marland Kitchen gabapentin (NEURONTIN) 400 MG capsule TAKE ONE CAPSULE BY MOUTH AT BEDTIME 90 capsule 0   No current facility-administered medications on file prior to visit.    Review of Systems Constitutional: Negative for other unusual diaphoresis, sweats, appetite or weight changes HENT: Negative for other worsening hearing loss, ear pain, facial swelling, mouth sores or neck stiffness.   Eyes: Negative for other worsening pain, redness or other visual disturbance.  Respiratory: Negative for other stridor or swelling Cardiovascular: Negative for other palpitations or other chest pain  Gastrointestinal: Negative for worsening diarrhea or loose stools, blood in stool, distention or other pain Genitourinary: Negative for hematuria, flank pain or other change in urine volume.  Musculoskeletal: Negative for myalgias or other joint swelling.  Skin: Negative for other color change, or other wound or worsening drainage.  Neurological: Negative  for other syncope Hematological: Negative for other adenopathy or swelling Psychiatric/Behavioral: Negative for hallucinations, other worsening agitation, SI, self-injury, or new decreased concentration All other system neg per pt    Objective:   Physical Exam BP 132/86   Pulse 83   Ht 5\' 5"  (1.651 m)   Wt 149 lb (67.6 kg)   SpO2 98%    BMI 24.79 kg/m  VS noted, not ill appearing Constitutional: Pt is oriented to person, place, and time. Appears well-developed and well-nourished, in no significant distress and comfortable Head: Normocephalic and atraumatic  Eyes: Conjunctivae and EOM are normal. Pupils are equal, round, and reactive to light Right Ear: External ear normal without discharge Left Ear: External ear normal without discharge Nose: Nose without discharge or deformity Mouth/Throat: Oropharynx is without other ulcerations and moist  Neck: Normal range of motion. Neck supple. No JVD present. No tracheal deviation present or significant neck LA or mass Cardiovascular: Normal rate, regular rhythm, normal heart sounds and intact distal pulses.   Pulmonary/Chest: WOB normal and breath sounds without rales or wheezing  Abdominal: Soft. Bowel sounds are normal. NT. No HSM  Musculoskeletal: Normal range of motion. 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  Neurological: Pt is alert and oriented to person, place, and time. Pt has normal reflexes. No cranial nerve deficit. Motor grossly intact, Gait intact Skin: Skin is warm and dry. No rash noted or new ulcerations Psychiatric:  Has normal mood and affect. Behavior is normal without agitation No other exam findings Lab Results  Component Value Date   WBC 8.5 04/02/2017   HGB 14.6 04/02/2017   HCT 43.1 04/02/2017   PLT 344.0 04/02/2017   GLUCOSE 100 (H) 04/02/2017   CHOL 172 04/02/2017   TRIG 155.0 (H) 04/02/2017   HDL 67.30 04/02/2017   LDLCALC 73 04/02/2017   ALT 21 04/02/2017   AST 20 04/02/2017   NA 140 04/02/2017   K 4.3 04/02/2017   CL 103 04/02/2017   CREATININE 0.68 04/02/2017   BUN 16 04/02/2017   CO2 27 04/02/2017   TSH 0.92 04/02/2017         Assessment & Plan:

## 2017-04-12 NOTE — Assessment & Plan Note (Addendum)
Mild to mod persistent, for gabapentin trial 100 tid, consider increase the 200 or 300 tid if not controlled

## 2017-04-12 NOTE — Assessment & Plan Note (Signed)
Mild, for celexa 10 qd,  to f/u any worsening symptoms or concerns

## 2017-04-12 NOTE — Assessment & Plan Note (Signed)
stable overall by history and exam, recent data reviewed with pt, and pt to continue medical treatment as before,  to f/u any worsening symptoms or concerns BP Readings from Last 3 Encounters:  04/09/17 132/86  06/22/16 (!) 143/91  06/18/16 130/82

## 2017-04-12 NOTE — Assessment & Plan Note (Signed)
Mild to mod, for voltaren gel prn,  to f/u any worsening symptoms or concerns

## 2017-04-12 NOTE — Assessment & Plan Note (Signed)

## 2017-06-06 ENCOUNTER — Other Ambulatory Visit: Payer: Self-pay | Admitting: Family Medicine

## 2017-09-02 ENCOUNTER — Other Ambulatory Visit: Payer: Self-pay | Admitting: Family Medicine

## 2017-09-11 ENCOUNTER — Encounter: Payer: Self-pay | Admitting: Internal Medicine

## 2017-09-11 ENCOUNTER — Ambulatory Visit: Payer: Managed Care, Other (non HMO) | Admitting: Family Medicine

## 2017-09-11 DIAGNOSIS — M5416 Radiculopathy, lumbar region: Secondary | ICD-10-CM | POA: Diagnosis not present

## 2017-09-11 MED ORDER — GABAPENTIN 400 MG PO CAPS: 400.0000 mg | ORAL_CAPSULE | Freq: Every day | ORAL | 3 refills | Status: DC

## 2017-09-11 MED ORDER — PREDNISONE 50 MG PO TABS: 50.0000 mg | ORAL_TABLET | Freq: Every day | ORAL | 0 refills | Status: DC

## 2017-09-11 NOTE — Assessment & Plan Note (Signed)
Patient has been well controlled with the gabapentin.  We will refill for 1 more year.  We discussed with him multiple different options again.  We have attempted osteopathic manipulation, we discussed other medications, we discussed epidurals as a possibility as well.  Patient feels as long as she does do home exercises she seems to do relatively well.  Follow-up again in 4 weeks

## 2017-09-11 NOTE — Patient Instructions (Signed)
Good to see you  Keep up with the exercises  Refilled the gabapentin  If having trouble we have many more trick for the back or shoulder If intense pain take 50 mg of prednisone for 5 days and then see me  Otherwise see me when you need me

## 2017-09-11 NOTE — Progress Notes (Signed)
[facility] [place], [address] [address] Phone: [phone] Subjective:     CC: Low back pain follow-up  TDV:[id number]  Barbara Gross is a 61 y.o. female coming in with complaint of low back pain.  Past medical history significant for facet arthropathy.  Patient has been doing very well with the gabapentin.   No new pain.      Past Medical History:  Diagnosis Date  . ABDOMINAL PAIN, LOWER 05/22/2010  . Allergy    pollen   . ANXIETY 12/24/2007  . Arthritis    back , shoulder  . BACTERIAL VAGINITIS 12/24/2007  . BREAST MASS, LEFT 07/15/2008  . DEPRESSION 12/24/2007  . DIVERTICULITIS OF COLON 04/21/2010  . HYPERLIPIDEMIA 05/31/2007  . HYPERTENSION 05/31/2007  . Hypertension   . MENOPAUSE, EARLY 01/04/2010  . Microscopic hematuria 01/03/2009  . MIGRAINE HEADACHE 05/31/2007   Past Surgical History:  Procedure Laterality Date  . ABDOMINAL HYSTERECTOMY    . COLONOSCOPY    . EXPLORATORY LAPAROTOMY    . OOPHORECTOMY    . POLYPECTOMY     Social History   Socioeconomic History  . Marital status: Single    Spouse name: None  . Number of children: None  . Years of education: None  . Highest education level: None  Social Needs  . Financial resource strain: None  . Food insecurity - worry: None  . Food insecurity - inability: None  . Transportation needs - medical: None  . Transportation needs - non-medical: None  Occupational History  . 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 Smoking status: Former Research scientist (life sciences)  . Smokeless tobacco: Never Used  . Tobacco comment: quit in the 80's  Substance and Sexual Activity  . Alcohol use: Yes    Alcohol/week: 8.4 oz    Types: 14 Glasses of wine per week    Comment: 2 glasses of wine a night  . Drug use: No  . Sexual activity: None  Other Topics Concern  . None  Social History Narrative  . None   Allergies  Allergen Reactions  . Codeine Nausea And Vomiting  .  Hydrocodone Nausea And Vomiting  . Venlafaxine Nausea Only   Family History  Problem Relation Age of Onset  . Hypertension Mother   . Ulcers Mother        bleeding  . Hyperlipidemia Mother   . Sudden death Mother        bleeding ulcer 1995  . Diabetes Father   . Hypertension Father   . Hyperlipidemia Father   . Colon polyps Father   . Cancer Other        prostate  . Melanoma Other   . Colon cancer Other        dx'd in 54's  . Stroke Other   . Colon cancer Paternal Grandmother   . Esophageal cancer Neg Hx   . Rectal cancer Neg Hx   . Stomach cancer Neg Hx      Past medical history, social, surgical and family history all reviewed in electronic medical record.  No pertanent information unless stated regarding to the chief complaint.   Review of Systems:Review of systems updated and as accurate as of 09/11/17  No headache, visual changes, nausea, vomiting, diarrhea, constipation, dizziness, abdominal pain, skin rash, fevers, chills, night sweats, weight loss, swollen lymph nodes, body aches, joint swelling, muscle aches, chest pain, shortness of breath, mood changes.   Objective  Blood pressure Marland Kitchen)  146/98, pulse 73, temperature 98.3 F (36.8 C), temperature source Oral, height 5\' 5"  (1.651 m), weight 152 lb (68.9 kg), SpO2 98 %. Systems examined below as of 09/11/17   General: No apparent distress alert and oriented x3 mood and affect normal, dressed appropriately.  HEENT: Pupils equal, extraocular movements intact  Respiratory: Patient's speak in full sentences and does not appear short of breath  Cardiovascular: No lower extremity edema, non tender, no erythema  Skin: Warm dry intact with no signs of infection or rash on extremities or on axial skeleton.  Abdomen: Soft nontender  Neuro: Cranial nerves II through XII are intact, neurovascularly intact in all extremities with 2+ DTRs and 2+ pulses.  Lymph: No lymphadenopathy of posterior or anterior cervical chain or axillae  bilaterally.  Gait normal with good balance and coordination.  MSK:  Non tender with full range of motion and good stability and symmetric strength and tone of shoulders, elbows, wrist, hip, knee and ankles bilaterally.  Back Exam:  Inspection: Loss of lordosis Motion: Flexion 45 deg, Extension 25 deg, Side Bending to 45 deg bilaterally,  Rotation to 45 deg bilaterally  SLR laying: Negative  XSLR laying: Negative  Palpable tenderness: Tender to palpation minorly in the paraspinal musculature L5-S1 bilaterally. FABER: negative. Sensory change: Gross sensation intact to all lumbar and sacral dermatomes.  Reflexes: 2+ at both patellar tendons, 2+ at achilles tendons, Babinski's downgoing.  Strength at foot  Plantar-flexion: 5/5 Dorsi-flexion: 5/5 Eversion: 5/5 Inversion: 5/5  Leg strength  Quad: 5/5 Hamstring: 5/5 Hip flexor: 5/5 Hip abductors: 5/5  Gait unremarkable.    Impression and Recommendations:     This case required medical decision making of moderate complexity.      Note: This dictation was prepared with Dragon dictation along with smaller phrase technology. Any transcriptional errors that result from this process are unintentional.

## 2017-10-03 ENCOUNTER — Other Ambulatory Visit: Payer: Self-pay | Admitting: Internal Medicine

## 2017-12-30 ENCOUNTER — Other Ambulatory Visit: Payer: Self-pay | Admitting: Internal Medicine

## 2018-02-04 ENCOUNTER — Other Ambulatory Visit: Payer: Self-pay | Admitting: Internal Medicine

## 2018-02-04 MED ORDER — LOSARTAN POTASSIUM 50 MG PO TABS: 50.0000 mg | ORAL_TABLET | Freq: Every day | ORAL | 1 refills | Status: DC

## 2018-02-04 NOTE — Telephone Encounter (Signed)
Ok for shirron to let pt know  We had to change her irbesartan 75 to losartan 50 mg (which roughly work about the same) due to irbesartan on backorder

## 2018-02-10 ENCOUNTER — Other Ambulatory Visit: Payer: Self-pay | Admitting: Internal Medicine

## 2018-02-10 MED ORDER — LOSARTAN POTASSIUM 50 MG PO TABS: 50.0000 mg | ORAL_TABLET | Freq: Every day | ORAL | 1 refills | Status: DC

## 2018-02-13 ENCOUNTER — Other Ambulatory Visit: Payer: Self-pay

## 2018-03-19 DIAGNOSIS — Z1231 Encounter for screening mammogram for malignant neoplasm of breast: Secondary | ICD-10-CM | POA: Diagnosis not present

## 2018-03-19 LAB — HM MAMMOGRAPHY

## 2018-03-28 ENCOUNTER — Other Ambulatory Visit: Payer: Self-pay | Admitting: Internal Medicine

## 2018-03-31 ENCOUNTER — Other Ambulatory Visit: Payer: Self-pay | Admitting: Internal Medicine

## 2018-04-07 ENCOUNTER — Other Ambulatory Visit: Payer: Self-pay | Admitting: Internal Medicine

## 2018-04-10 ENCOUNTER — Encounter: Payer: Managed Care, Other (non HMO) | Admitting: Internal Medicine

## 2018-04-17 ENCOUNTER — Other Ambulatory Visit: Payer: Self-pay | Admitting: Internal Medicine

## 2018-04-18 ENCOUNTER — Other Ambulatory Visit (INDEPENDENT_AMBULATORY_CARE_PROVIDER_SITE_OTHER): Payer: BLUE CROSS/BLUE SHIELD

## 2018-04-18 DIAGNOSIS — Z0000 Encounter for general adult medical examination without abnormal findings: Secondary | ICD-10-CM

## 2018-04-18 LAB — BASIC METABOLIC PANEL
BUN: 14 mg/dL (ref 6–23)
CO2: 29 mEq/L (ref 19–32)
Calcium: 9.6 mg/dL (ref 8.4–10.5)
Chloride: 102 mEq/L (ref 96–112)
Creatinine, Ser: 0.61 mg/dL (ref 0.40–1.20)
GFR: 105.71 mL/min (ref >60.00)
Glucose, Bld: 102 mg/dL — ABNORMAL HIGH (ref 70–99)
Potassium: 4.4 mEq/L (ref 3.5–5.1)
Sodium: 140 mEq/L (ref 135–145)

## 2018-04-18 LAB — CBC WITH DIFFERENTIAL/PLATELET
Basophils Absolute: 0 10*3/uL (ref 0.0–0.1)
Basophils Relative: 0.6 % (ref 0.0–3.0)
Eosinophils Absolute: 0.2 10*3/uL (ref 0.0–0.7)
Eosinophils Relative: 2.9 % (ref 0.0–5.0)
HCT: 42.6 % (ref 36.0–46.0)
Hemoglobin: 14.4 g/dL (ref 12.0–15.0)
Lymphocytes Relative: 39.1 % (ref 12.0–46.0)
Lymphs Abs: 2.6 10*3/uL (ref 0.7–4.0)
MCHC: 33.9 g/dL (ref 30.0–36.0)
MCV: 99.2 fl (ref 78.0–100.0)
Monocytes Absolute: 0.8 10*3/uL (ref 0.1–1.0)
Monocytes Relative: 11.4 % (ref 3.0–12.0)
Neutro Abs: 3 10*3/uL (ref 1.4–7.7)
Neutrophils Relative %: 46 % (ref 43.0–77.0)
Platelets: 312 10*3/uL (ref 150.0–400.0)
RBC: 4.3 Mil/uL (ref 3.87–5.11)
RDW: 13.3 % (ref 11.5–15.5)
WBC: 6.6 10*3/uL (ref 4.0–10.5)

## 2018-04-18 LAB — URINALYSIS, ROUTINE W REFLEX MICROSCOPIC
Bilirubin Urine: NEGATIVE
Ketones, ur: NEGATIVE
Leukocytes, UA: NEGATIVE
Nitrite: NEGATIVE
Specific Gravity, Urine: 1.015 (ref 1.000–1.030)
Total Protein, Urine: NEGATIVE
Urine Glucose: NEGATIVE
Urobilinogen, UA: 0.2 (ref 0.0–1.0)
WBC, UA: NONE SEEN (ref 0–?)
pH: 7 (ref 5.0–8.0)

## 2018-04-18 LAB — HEPATIC FUNCTION PANEL
ALT: 24 U/L (ref 0–35)
AST: 20 U/L (ref 0–37)
Albumin: 4.4 g/dL (ref 3.5–5.2)
Alkaline Phosphatase: 58 U/L (ref 39–117)
Bilirubin, Direct: 0.1 mg/dL (ref 0.0–0.3)
Total Bilirubin: 0.5 mg/dL (ref 0.2–1.2)
Total Protein: 7.4 g/dL (ref 6.0–8.3)

## 2018-04-18 LAB — LIPID PANEL
Cholesterol: 168 mg/dL (ref 0–200)
HDL: 71.7 mg/dL (ref >39.00)
LDL Cholesterol: 72 mg/dL (ref 0–99)
NonHDL: 95.99
Total CHOL/HDL Ratio: 2
Triglycerides: 118 mg/dL (ref 0.0–149.0)
VLDL: 23.6 mg/dL (ref 0.0–40.0)

## 2018-04-18 LAB — TSH: TSH: 0.87 u[IU]/mL (ref 0.35–4.50)

## 2018-04-18 IMAGING — DX DG LUMBAR SPINE COMPLETE 4+V
5 series · 5 of 5 positions shown · non-contrast
Comparison: MRI 05/08/2011

CLINICAL DATA: Chronic low back pain, intermittent for 6 months. No
known injury.

EXAM:
LUMBAR SPINE - COMPLETE 4+ VIEW

[l-spine ap]
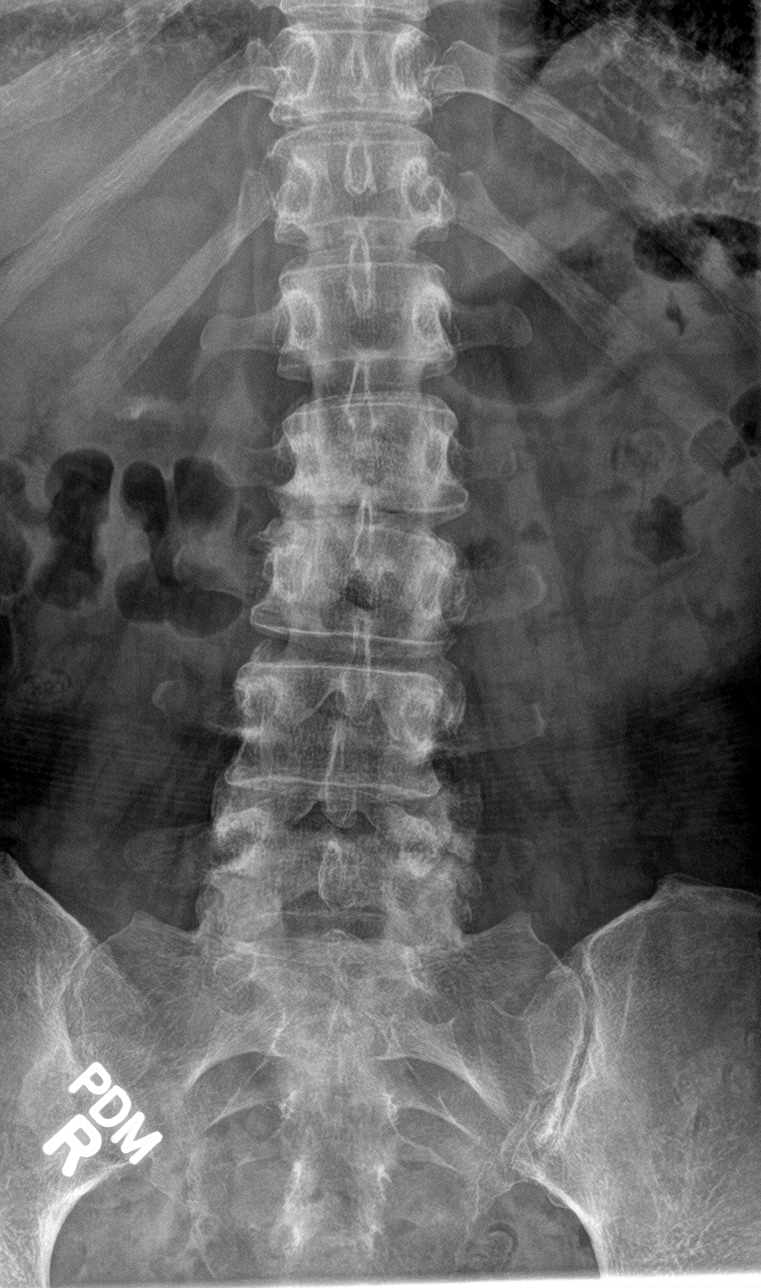

[l-spine obl (1 of 2)]
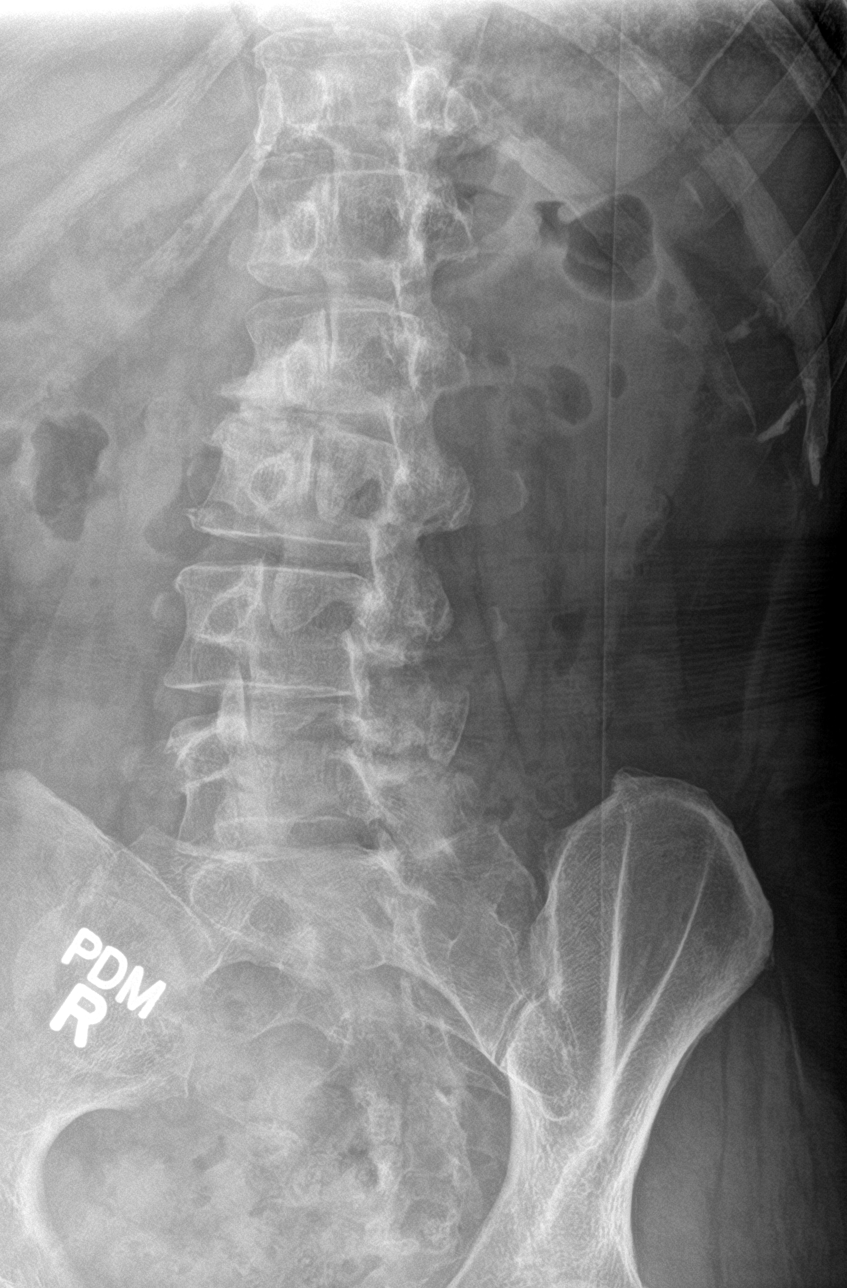

[l-spine obl (2 of 2)]
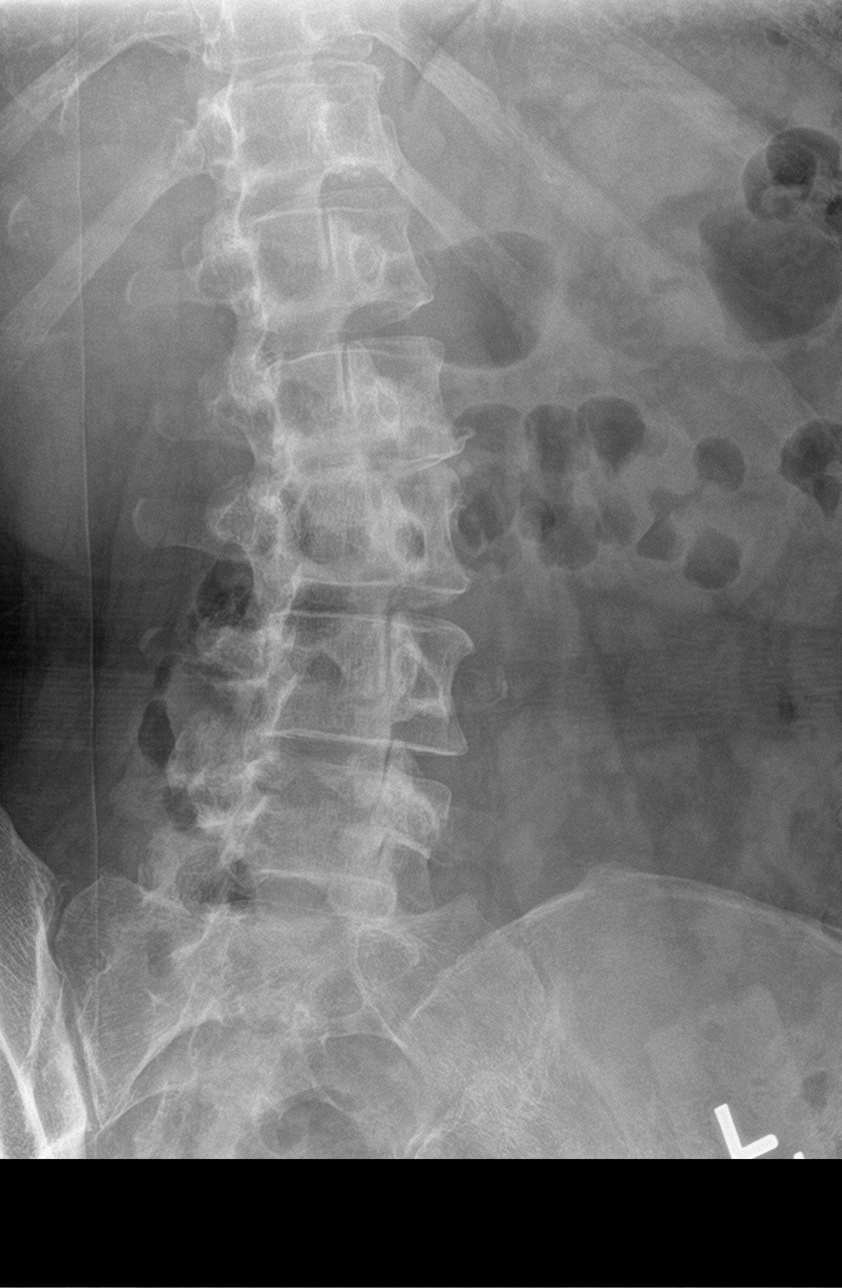

[l-spine lat]
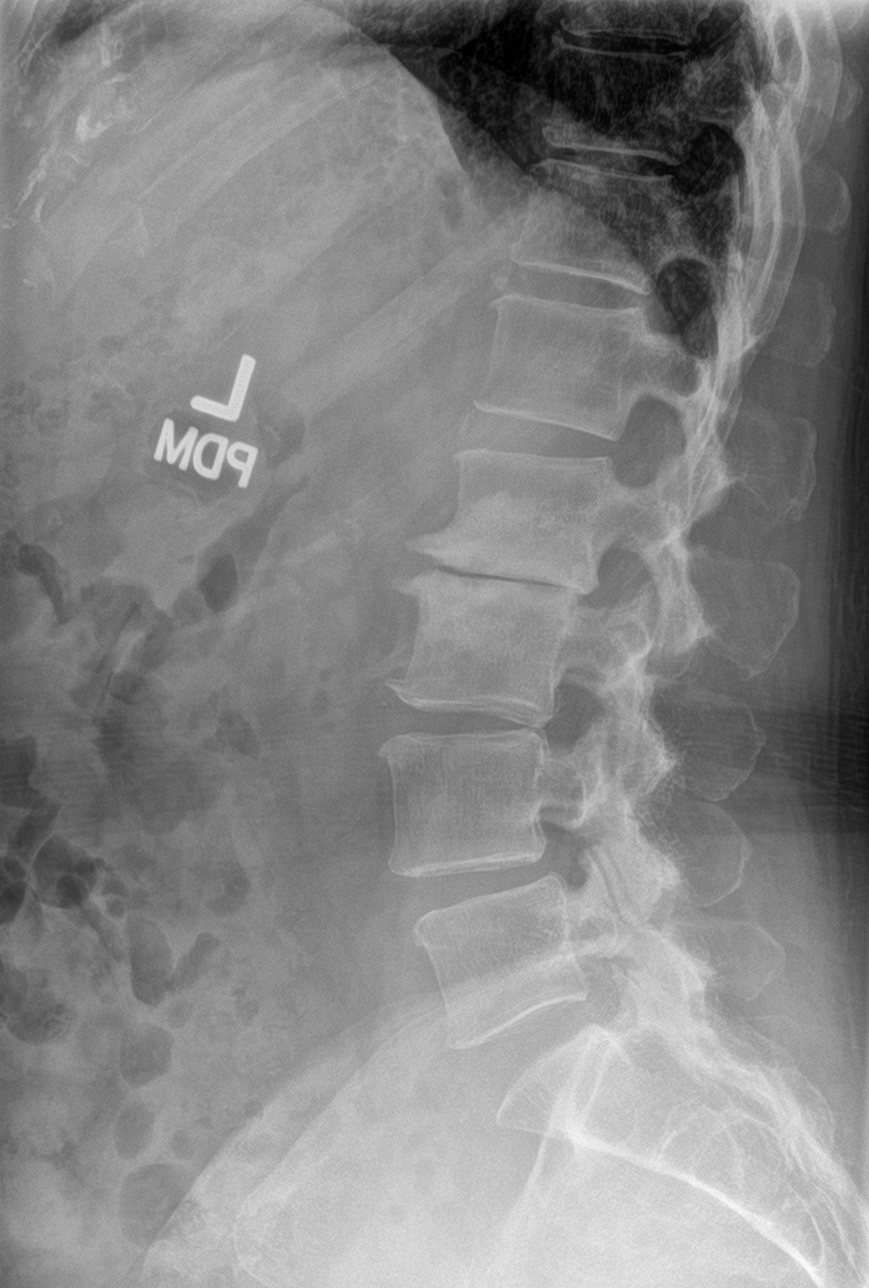

[l-spine spot]
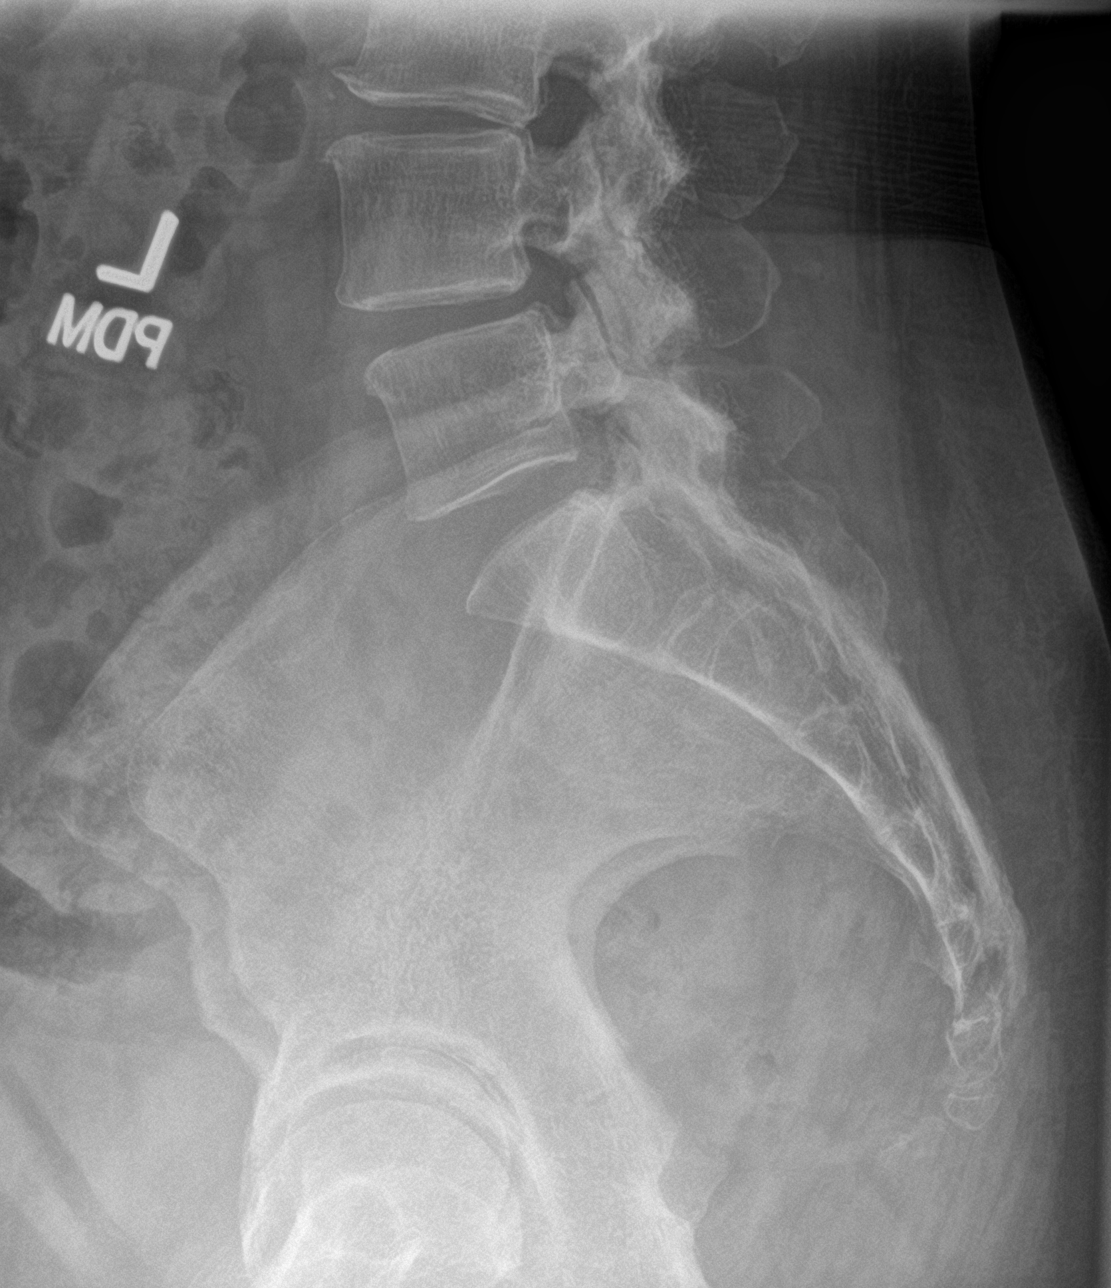

[5 of 5 positions shown; findings below may reference images not displayed]

FINDINGS: Slight leftward scoliosis in the mid lumbar spine with associated
degenerative disc disease at L2-3 with disc space narrowing and
endplate sclerosis. Vacuum disc and spurring also noted.
Degenerative facet disease throughout the lumbar spine, most
pronounced in the lower lumbar spine. No fracture or subluxation. SI
joints are symmetric and unremarkable.
IMPRESSION: Degenerative disc disease at L2-3. Degenerative facet disease
throughout the lumbar spine. No acute findings.

## 2018-04-23 ENCOUNTER — Encounter: Payer: Self-pay | Admitting: Internal Medicine

## 2018-04-23 ENCOUNTER — Ambulatory Visit (INDEPENDENT_AMBULATORY_CARE_PROVIDER_SITE_OTHER): Payer: BLUE CROSS/BLUE SHIELD | Admitting: Internal Medicine

## 2018-04-23 VITALS — BP 122/82 | HR 84 | Temp 98.4°F | Ht 65.0 in | Wt 154.0 lb

## 2018-04-23 DIAGNOSIS — Z114 Encounter for screening for human immunodeficiency virus [HIV]: Secondary | ICD-10-CM

## 2018-04-23 DIAGNOSIS — I10 Essential (primary) hypertension: Secondary | ICD-10-CM

## 2018-04-23 DIAGNOSIS — Z0000 Encounter for general adult medical examination without abnormal findings: Secondary | ICD-10-CM | POA: Diagnosis not present

## 2018-04-23 MED ORDER — LOVASTATIN 20 MG PO TABS: 20.0000 mg | ORAL_TABLET | Freq: Every day | ORAL | 3 refills | Status: DC

## 2018-04-23 MED ORDER — GABAPENTIN 400 MG PO CAPS: 400.0000 mg | ORAL_CAPSULE | Freq: Every day | ORAL | 1 refills | Status: DC

## 2018-04-23 MED ORDER — MELOXICAM 15 MG PO TABS: ORAL_TABLET | ORAL | 3 refills | Status: DC

## 2018-04-23 MED ORDER — GABAPENTIN 100 MG PO CAPS: ORAL_CAPSULE | ORAL | 1 refills | Status: DC

## 2018-04-23 MED ORDER — AMLODIPINE BESYLATE 5 MG PO TABS: 5.0000 mg | ORAL_TABLET | Freq: Every day | ORAL | 3 refills | Status: DC

## 2018-04-23 MED ORDER — LOSARTAN POTASSIUM 50 MG PO TABS: 50.0000 mg | ORAL_TABLET | Freq: Every day | ORAL | 3 refills | Status: DC

## 2018-04-23 MED ORDER — CITALOPRAM HYDROBROMIDE 10 MG PO TABS: 10.0000 mg | ORAL_TABLET | Freq: Every day | ORAL | 3 refills | Status: DC

## 2018-04-23 NOTE — Assessment & Plan Note (Signed)
stable overall by history and exam, recent data reviewed with pt, and pt to continue medical treatment as before,  to f/u any worsening symptoms or concerns  

## 2018-04-23 NOTE — Progress Notes (Signed)
Subjective:    Patient ID: Barbara Gross, female    DOB: [birth year], 62 y.o.   MRN: [id number]  HPI  Here for wellness and f/u;  Overall doing ok;  Pt denies Chest pain, worsening SOB, DOE, wheezing, orthopnea, PND, worsening LE edema, palpitations, dizziness or syncope.  Pt denies neurological change such as new headache, facial or extremity weakness.  Pt denies polydipsia, polyuria, or low sugar symptoms. Pt states overall good compliance with treatment and medications, good tolerability, and has been trying to follow appropriate diet.  Pt denies worsening depressive symptoms, suicidal ideation or panic. No fever, night sweats, wt loss, loss of appetite, or other constitutional symptoms.  Pt states good ability with ADL's, has low fall risk, home safety reviewed and adequate, no other significant changes in hearing or vision, and only occasionally active with exercise.  No new complaints Wt Readings from Last 3 Encounters:  04/23/18 154 lb (69.9 kg)  09/11/17 152 lb (68.9 kg)  04/09/17 149 lb (67.6 kg)   Past Medical History:  Diagnosis Date  . ABDOMINAL PAIN, LOWER 05/22/2010  . Allergy    pollen   . ANXIETY 12/24/2007  . Arthritis    back , shoulder  . BACTERIAL VAGINITIS 12/24/2007  . BREAST MASS, LEFT 07/15/2008  . DEPRESSION 12/24/2007  . DIVERTICULITIS OF COLON 04/21/2010  . HYPERLIPIDEMIA 05/31/2007  . HYPERTENSION 05/31/2007  . Hypertension   . MENOPAUSE, EARLY 01/04/2010  . Microscopic hematuria 01/03/2009  . MIGRAINE HEADACHE 05/31/2007   Past Surgical History:  Procedure Laterality Date  . ABDOMINAL HYSTERECTOMY    . COLONOSCOPY    . EXPLORATORY LAPAROTOMY    . OOPHORECTOMY    . POLYPECTOMY      reports that she has quit smoking. She has never used smokeless tobacco. She reports that she drinks about 8.4 oz of alcohol per week. She reports that she does not use drugs. family history includes Cancer in her other; Colon cancer in her other and paternal grandmother; Colon  polyps in her father; Diabetes in her father; Hyperlipidemia in her father and mother; Hypertension in her father and mother; Melanoma in her other; Stroke in her other; Sudden death in her mother; Ulcers in her mother. Allergies  Allergen Reactions  . Codeine Nausea And Vomiting  . Hydrocodone Nausea And Vomiting  . Venlafaxine Nausea Only   Current Outpatient Medications on File Prior to Visit  Medication Sig Dispense Refill  . aspirin 81 MG EC tablet Take 81 mg by mouth daily.      . fluticasone (FLONASE) 50 MCG/ACT nasal spray PLACE 2 SPRAYS INTO BOTH NOSTRILS DAILY. 16 g 5   No current facility-administered medications on file prior to visit.    Review of Systems Constitutional: Negative for other unusual diaphoresis, sweats, appetite or weight changes HENT: Negative for other worsening hearing loss, ear pain, facial swelling, mouth sores or neck stiffness.   Eyes: Negative for other worsening pain, redness or other visual disturbance.  Respiratory: Negative for other stridor or swelling Cardiovascular: Negative for other palpitations or other chest pain  Gastrointestinal: Negative for worsening diarrhea or loose stools, blood in stool, distention or other pain Genitourinary: Negative for hematuria, flank pain or other change in urine volume.  Musculoskeletal: Negative for myalgias or other joint swelling.  Skin: Negative for other color change, or other wound or worsening drainage.  Neurological: Negative for other syncope or numbness. Hematological: Negative for other adenopathy or swelling Psychiatric/Behavioral: Negative for hallucinations, other worsening agitation, SI,  self-injury, or new decreased concentration All other system neg per pt    Objective:   Physical Exam BP 122/82   Pulse 84   Temp 98.4 F (36.9 C) (Oral)   Ht 5\' 5"  (1.651 m)   Wt 154 lb (69.9 kg)   SpO2 96%   BMI 25.63 kg/m  VS noted,  Constitutional: Pt is oriented to person, place, and time.  Appears well-developed and well-nourished, in no significant distress and comfortable Head: Normocephalic and atraumatic  Eyes: Conjunctivae and EOM are normal. Pupils are equal, round, and reactive to light Right Ear: External ear normal without discharge Left Ear: External ear normal without discharge Nose: Nose without discharge or deformity Mouth/Throat: Oropharynx is without other ulcerations and moist  Neck: Normal range of motion. Neck supple. No JVD present. No tracheal deviation present or significant neck LA or mass Cardiovascular: Normal rate, regular rhythm, normal heart sounds and intact distal pulses.   Pulmonary/Chest: WOB normal and breath sounds without rales or wheezing  Abdominal: Soft. Bowel sounds are normal. NT. No HSM  Musculoskeletal: Normal range of motion. Exhibits no edema Lymphadenopathy: Has no other cervical adenopathy.  Neurological: Pt is alert and oriented to person, place, and time. Pt has normal reflexes. No cranial nerve deficit. Motor grossly intact, Gait intact Skin: Skin is warm and dry. No rash noted or new ulcerations Psychiatric:  Has normal mood and affect. Behavior is normal without agitation No other exam findings Lab Results  Component Value Date   WBC 6.6 04/18/2018   HGB 14.4 04/18/2018   HCT 42.6 04/18/2018   PLT 312.0 04/18/2018   GLUCOSE 102 (H) 04/18/2018   CHOL 168 04/18/2018   TRIG 118.0 04/18/2018   HDL 71.70 04/18/2018   LDLCALC 72 04/18/2018   ALT 24 04/18/2018   AST 20 04/18/2018   NA 140 04/18/2018   K 4.4 04/18/2018   CL 102 04/18/2018   CREATININE 0.61 04/18/2018   BUN 14 04/18/2018   CO2 29 04/18/2018   TSH 0.87 04/18/2018       Assessment & Plan:

## 2018-04-23 NOTE — Patient Instructions (Signed)
Please continue all other medications as before, and refills have been done if requested.  Please have the pharmacy call with any other refills you may need.  Please continue your efforts at being more active, low cholesterol diet, and weight control.  You are otherwise up to date with prevention measures today.  Please keep your appointments with your specialists as you may have planned  Please return in 1 year for your yearly visit, or sooner if needed, with Lab testing done 3-5 days before  

## 2018-04-23 NOTE — Assessment & Plan Note (Signed)

## 2018-07-27 DIAGNOSIS — Z23 Encounter for immunization: Secondary | ICD-10-CM | POA: Diagnosis not present

## 2018-11-12 DIAGNOSIS — K1329 Other disturbances of oral epithelium, including tongue: Secondary | ICD-10-CM | POA: Diagnosis not present

## 2018-11-26 DIAGNOSIS — L821 Other seborrheic keratosis: Secondary | ICD-10-CM | POA: Diagnosis not present

## 2018-11-26 DIAGNOSIS — B079 Viral wart, unspecified: Secondary | ICD-10-CM | POA: Diagnosis not present

## 2018-11-26 DIAGNOSIS — L820 Inflamed seborrheic keratosis: Secondary | ICD-10-CM | POA: Diagnosis not present

## 2018-11-26 DIAGNOSIS — D485 Neoplasm of uncertain behavior of skin: Secondary | ICD-10-CM | POA: Diagnosis not present

## 2018-11-26 DIAGNOSIS — L814 Other melanin hyperpigmentation: Secondary | ICD-10-CM | POA: Diagnosis not present

## 2018-12-15 ENCOUNTER — Other Ambulatory Visit: Payer: Self-pay | Admitting: Internal Medicine

## 2019-01-22 ENCOUNTER — Encounter: Payer: Self-pay | Admitting: Internal Medicine

## 2019-01-22 DIAGNOSIS — M25562 Pain in left knee: Secondary | ICD-10-CM

## 2019-02-26 NOTE — Progress Notes (Signed)
[name] [place], [place] [address] Phone: [phone] Subjective:   I Barbara Gross am serving as a Education administrator for Dr. Hulan Saas.  CC: knee pain   [id number]  Barbara Gross is a 63 y.o. female coming in with complaint of left knee pain. Tingling sensation down the back of her leg and in the quad.  Onset- 3 months  Location- lateral Duration-  Character- sore  Aggravating factors- flexion, walking Reliving factors- ice and tylenol per Dr. Jenny Reichmann Therapies tried-  Severity-9 out of 10 more 5 out of 10     Past Medical History:  Diagnosis Date  . ABDOMINAL PAIN, LOWER 05/22/2010  . Allergy    pollen   . ANXIETY 12/24/2007  . Arthritis    back , shoulder  . BACTERIAL VAGINITIS 12/24/2007  . BREAST MASS, LEFT 07/15/2008  . DEPRESSION 12/24/2007  . DIVERTICULITIS OF COLON 04/21/2010  . HYPERLIPIDEMIA 05/31/2007  . HYPERTENSION 05/31/2007  . Hypertension   . MENOPAUSE, EARLY 01/04/2010  . Microscopic hematuria 01/03/2009  . MIGRAINE HEADACHE 05/31/2007   Past Surgical History:  Procedure Laterality Date  . ABDOMINAL HYSTERECTOMY    . COLONOSCOPY    . EXPLORATORY LAPAROTOMY    . OOPHORECTOMY    . POLYPECTOMY     Social History   Socioeconomic History  . Marital status: Single    Spouse name: Not on file  . Number of children: Not on file  . Years of education: Not on file  . Highest education level: Not on file  Occupational History  . Occupation: Control and instrumentation engineer for [employer]  . Financial resource strain: Not on file  . Food insecurity:    Worry: Not on file    Inability: Not on file  . Transportation needs:    Medical: Not on file    Non-medical: Not on file  Tobacco Use  . Smoking status: Former Research scientist (life sciences)  . Smokeless tobacco: Never Used  . Tobacco comment: quit in the 80's  Substance and Sexual Activity  . Alcohol use: Yes    Alcohol/week: 14.0 standard drinks    Types: 14 Glasses of wine  per week    Comment: 2 glasses of wine a night  . Drug use: No  . Sexual activity: Not on file  Lifestyle  . Physical activity:    Days per week: Not on file    Minutes per session: Not on file  . Stress: Not on file  Relationships  . Social connections:    Talks on phone: Not on file    Gets together: Not on file    Attends religious service: Not on file    Active member of club or organization: Not on file    Attends meetings of clubs or organizations: Not on file    Relationship status: Not on file  Other Topics Concern  . Not on file  Social History Narrative  . Not on file   Allergies  Allergen Reactions  . Codeine Nausea And Vomiting  . Hydrocodone Nausea And Vomiting  . Venlafaxine Nausea Only   Family History  Problem Relation Age of Onset  . Hypertension Mother   . Ulcers Mother        bleeding  . Hyperlipidemia Mother   . Sudden death Mother        bleeding ulcer 1995  . Diabetes Father   . Hypertension Father   . Hyperlipidemia Father   . Colon  polyps Father   . Cancer Other        prostate  . Melanoma Other   . Colon cancer Other        dx'd in 78's  . Stroke Other   . Colon cancer Paternal Grandmother   . Esophageal cancer Neg Hx   . Rectal cancer Neg Hx   . Stomach cancer Neg Hx      Current Outpatient Medications (Cardiovascular):  .  amLODipine (NORVASC) 5 MG tablet, Take 1 tablet (5 mg total) by mouth daily. Marland Kitchen  losartan (COZAAR) 50 MG tablet, Take 1 tablet (50 mg total) by mouth daily. Marland Kitchen  lovastatin (MEVACOR) 20 MG tablet, Take 1 tablet (20 mg total) by mouth at bedtime.  Current Outpatient Medications (Respiratory):  .  fluticasone (FLONASE) 50 MCG/ACT nasal spray, PLACE 2 SPRAYS INTO BOTH NOSTRILS DAILY.  Current Outpatient Medications (Analgesics):  .  aspirin 81 MG EC tablet, Take 81 mg by mouth daily.   .  meloxicam (MOBIC) 15 MG tablet, TAKE ONE TABLET BY MOUTH DAILY AS NEEDED FOR PAIN   Current Outpatient Medications (Other):   .  citalopram (CELEXA) 10 MG tablet, Take 1 tablet (10 mg total) by mouth daily. Marland Kitchen  gabapentin (NEURONTIN) 100 MG capsule, TAKE ONE CAPSULE BY MOUTH TWICE A DAY DURING DAYTIME HOURS .  gabapentin (NEURONTIN) 400 MG capsule, Take 1 capsule (400 mg total) by mouth at bedtime.    Past medical history, social, surgical and family history all reviewed in electronic medical record.  No pertanent information unless stated regarding to the chief complaint.   Review of Systems:  No headache, visual changes, nausea, vomiting, diarrhea, constipation, dizziness, abdominal pain, skin rash, fevers, chills, night sweats, weight loss, swollen lymph nodes, body aches, , chest pain, shortness of breath, mood changes.  Positive muscle aches and joint swelling  Objective  Blood pressure (!) 150/82, pulse (!) 42, height 5\' 5"  (1.651 m), weight 154 lb (69.9 kg), SpO2 95 %.    General: No apparent distress alert and oriented x3 mood and affect normal, dressed appropriately.  HEENT: Pupils equal, extraocular movements intact  Respiratory: Patient's speak in full sentences and does not appear short of breath  Cardiovascular: No lower extremity edema, non tender, no erythema  Skin: Warm dry intact with no signs of infection or rash on extremities or on axial skeleton.  Abdomen: Soft nontender  Neuro: Cranial nerves II through XII are intact, neurovascularly intact in all extremities with 2+ DTRs and 2+ pulses.  Lymph: No lymphadenopathy of posterior or anterior cervical chain or axillae bilaterally.  Gait normal with good balance and coordination.  MSK:  Non tender with full range of motion and good stability and symmetric strength and tone of shoulders, elbows, wrist, hip and ankles bilaterally.  Left knee exam does not have any significant instability noted.  Patient does have a very large cyst noted on the back of the knee area.  Negative pain to compression of the calf.  Mild patellar crepitus noted with range  of motion.  Negative patellar grind.  Neurovascular intact distally with 5 out of 5 strength.  Limited musculoskeletal ultrasound was performed and interpreted by Lyndal Pulley  Limited ultrasound of patient's right knee shows the patient does have a large Baker's cyst.  Mild to moderate patellofemoral as well as space narrowing.  No seen acute tear of the meniscus with some degenerative changes noted. Pression: Left-sided Baker's cyst      Impression and Recommendations:  This case required medical decision making of moderate complexity. The above documentation has been reviewed and is accurate and complete Lyndal Pulley, DO       Note: This dictation was prepared with Dragon dictation along with smaller phrase technology. Any transcriptional errors that result from this process are unintentional.

## 2019-02-27 ENCOUNTER — Ambulatory Visit: Payer: Self-pay

## 2019-02-27 ENCOUNTER — Encounter: Payer: Self-pay | Admitting: Family Medicine

## 2019-02-27 ENCOUNTER — Ambulatory Visit (INDEPENDENT_AMBULATORY_CARE_PROVIDER_SITE_OTHER): Payer: BLUE CROSS/BLUE SHIELD | Admitting: Family Medicine

## 2019-02-27 ENCOUNTER — Other Ambulatory Visit: Payer: Self-pay

## 2019-02-27 VITALS — BP 150/82 | HR 42 | Ht 65.0 in | Wt 154.0 lb

## 2019-02-27 DIAGNOSIS — G8929 Other chronic pain: Secondary | ICD-10-CM

## 2019-02-27 DIAGNOSIS — M25562 Pain in left knee: Secondary | ICD-10-CM

## 2019-02-27 DIAGNOSIS — M7122 Synovial cyst of popliteal space [Baker], left knee: Secondary | ICD-10-CM

## 2019-02-27 MED ORDER — GABAPENTIN 400 MG PO CAPS: 400.0000 mg | ORAL_CAPSULE | Freq: Every day | ORAL | 1 refills | Status: DC

## 2019-02-27 NOTE — Assessment & Plan Note (Signed)
Patient does have more of a Baker's cyst.  Has responded previously to conservative therapy and patient was willing to do that again at the moment.  Discussed compression, icing regimen, topical anti-inflammatories.  Worsening symptoms will consider the possibility of aspiration.  Mild underlying arthritic changes.  Patient is in agreement with the plan and will follow-up in 6 weeks

## 2019-02-27 NOTE — Patient Instructions (Addendum)
Good to see you  Baker cyst  Exercises 3 times a week.  Knee compression daily  See me again in 4 weeks if not better and we will aspirate (drain it) Be safe

## 2019-03-02 ENCOUNTER — Other Ambulatory Visit: Payer: Self-pay | Admitting: Internal Medicine

## 2019-03-22 ENCOUNTER — Encounter: Payer: Self-pay | Admitting: Family Medicine

## 2019-03-25 ENCOUNTER — Other Ambulatory Visit: Payer: Self-pay | Admitting: Family Medicine

## 2019-03-25 ENCOUNTER — Other Ambulatory Visit: Payer: Self-pay

## 2019-03-25 ENCOUNTER — Other Ambulatory Visit: Payer: BC Managed Care – PPO

## 2019-03-25 ENCOUNTER — Ambulatory Visit (INDEPENDENT_AMBULATORY_CARE_PROVIDER_SITE_OTHER)
Admission: RE | Admit: 2019-03-25 | Discharge: 2019-03-25 | Disposition: A | Discharge status: Home / Self Care | Payer: BC Managed Care – PPO | Source: Ambulatory Visit | Attending: Family Medicine | Admitting: Family Medicine

## 2019-03-25 ENCOUNTER — Ambulatory Visit (INDEPENDENT_AMBULATORY_CARE_PROVIDER_SITE_OTHER): Payer: BC Managed Care – PPO | Admitting: Family Medicine

## 2019-03-25 ENCOUNTER — Ambulatory Visit: Payer: Self-pay

## 2019-03-25 ENCOUNTER — Encounter: Payer: Self-pay | Admitting: Family Medicine

## 2019-03-25 VITALS — BP 140/96 | HR 112 | Ht 65.0 in | Wt 159.0 lb

## 2019-03-25 DIAGNOSIS — M25562 Pain in left knee: Secondary | ICD-10-CM

## 2019-03-25 DIAGNOSIS — G8929 Other chronic pain: Secondary | ICD-10-CM

## 2019-03-25 DIAGNOSIS — M25552 Pain in left hip: Secondary | ICD-10-CM

## 2019-03-25 DIAGNOSIS — M7122 Synovial cyst of popliteal space [Baker], left knee: Secondary | ICD-10-CM

## 2019-03-25 DIAGNOSIS — M1612 Unilateral primary osteoarthritis, left hip: Secondary | ICD-10-CM | POA: Diagnosis not present

## 2019-03-25 NOTE — Patient Instructions (Addendum)
Good to see you  Ice 20 minutes 2 times daily. Usually after activity and before bed. Drained the knee  Consider a thigh compression sleeve and lets see how it goes  Hip xray downstairs today  See me again in 3 weeks

## 2019-03-25 NOTE — Progress Notes (Signed)
[name] [address] [place], [place] [address] Phone: [phone] Subjective:   [name], am serving as a scribe for Dr. Hulan Saas.  I'm seeing this patient by the request  of:    CC: left knee follow-up  CZY:[id number]   02/27/2019: Patient does have more of a Baker's cyst.  Has responded previously to conservative therapy and patient was willing to do that again at the moment.  Discussed compression, icing regimen, topical anti-inflammatories.  Worsening symptoms will consider the possibility of aspiration.  Mild underlying arthritic changes.  Patient is in agreement with the plan and will follow-up in 6 weeks  Update 03/25/2019: Barbara Gross is a 63 y.o. female coming in with complaint of left knee pain. Pain has been increasing since Sunday. Feels a tingling in the thigh and calf. Pain radiates up into her hip. No new injury. Has been using Tylenol for pain.  Patient feels like the knee is more full on the posterior aspect of the knee than usual.  Patient states that the leg pain seems to be a little bit different.  Does have a history of back pain.    Past Medical History:  Diagnosis Date  . ABDOMINAL PAIN, LOWER 05/22/2010  . Allergy    pollen   . ANXIETY 12/24/2007  . Arthritis    back , shoulder  . BACTERIAL VAGINITIS 12/24/2007  . BREAST MASS, LEFT 07/15/2008  . DEPRESSION 12/24/2007  . DIVERTICULITIS OF COLON 04/21/2010  . HYPERLIPIDEMIA 05/31/2007  . HYPERTENSION 05/31/2007  . Hypertension   . MENOPAUSE, EARLY 01/04/2010  . Microscopic hematuria 01/03/2009  . MIGRAINE HEADACHE 05/31/2007   Past Surgical History:  Procedure Laterality Date  . ABDOMINAL HYSTERECTOMY    . COLONOSCOPY    . EXPLORATORY LAPAROTOMY    . OOPHORECTOMY    . POLYPECTOMY     Social History   Socioeconomic History  . Marital status: Single    Spouse name: Not on file  . Number of children: Not on file  . Years of education: Not on file  .  Highest education level: Not on file  Occupational History  . Occupation: Control and instrumentation engineer for [employer]  . Financial resource strain: Not on file  . Food insecurity    Worry: Not on file    Inability: Not on file  . Transportation needs    Medical: Not on file    Non-medical: Not on file  Tobacco Use  . Smoking status: Former Research scientist (life sciences)  . Smokeless tobacco: Never Used  . Tobacco comment: quit in the 80's  Substance and Sexual Activity  . Alcohol use: Yes    Alcohol/week: 14.0 standard drinks    Types: 14 Glasses of wine per week    Comment: 2 glasses of wine a night  . Drug use: No  . Sexual activity: Not on file  Lifestyle  . Physical activity    Days per week: Not on file    Minutes per session: Not on file  . Stress: Not on file  Relationships  . Social Herbalist on phone: Not on file    Gets together: Not on file    Attends religious service: Not on file    Active member of club or organization: Not on file    Attends meetings of clubs or organizations: Not on file    Relationship status: Not on file  Other Topics Concern  . Not on  file  Social History Narrative  . Not on file   Allergies  Allergen Reactions  . Codeine Nausea And Vomiting  . Hydrocodone Nausea And Vomiting  . Venlafaxine Nausea Only   Family History  Problem Relation Age of Onset  . Hypertension Mother   . Ulcers Mother        bleeding  . Hyperlipidemia Mother   . Sudden death Mother        bleeding ulcer 1995  . Diabetes Father   . Hypertension Father   . Hyperlipidemia Father   . Colon polyps Father   . Cancer Other        prostate  . Melanoma Other   . Colon cancer Other        dx'd in 45's  . Stroke Other   . Colon cancer Paternal Grandmother   . Esophageal cancer Neg Hx   . Rectal cancer Neg Hx   . Stomach cancer Neg Hx      Current Outpatient Medications (Cardiovascular):  .  amLODipine (NORVASC) 5 MG tablet, Take 1 tablet (5 mg total) by  mouth daily. Marland Kitchen  losartan (COZAAR) 50 MG tablet, Take 1 tablet (50 mg total) by mouth daily. Marland Kitchen  lovastatin (MEVACOR) 20 MG tablet, TAKE ONE TABLET BY MOUTH EVERY NIGHT AT BEDTIME  Current Outpatient Medications (Respiratory):  .  fluticasone (FLONASE) 50 MCG/ACT nasal spray, PLACE 2 SPRAYS INTO BOTH NOSTRILS DAILY.  Current Outpatient Medications (Analgesics):  .  aspirin 81 MG EC tablet, Take 81 mg by mouth daily.   .  meloxicam (MOBIC) 15 MG tablet, TAKE ONE TABLET BY MOUTH DAILY AS NEEDED FOR PAIN   Current Outpatient Medications (Other):  .  citalopram (CELEXA) 10 MG tablet, Take 1 tablet (10 mg total) by mouth daily. Marland Kitchen  gabapentin (NEURONTIN) 100 MG capsule, TAKE ONE CAPSULE BY MOUTH TWICE A DAY DURING DAYTIME HOURS .  gabapentin (NEURONTIN) 400 MG capsule, Take 1 capsule (400 mg total) by mouth at bedtime.    Past medical history, social, surgical and family history all reviewed in electronic medical record.  No pertanent information unless stated regarding to the chief complaint.   Review of Systems:  No headache, visual changes, nausea, vomiting, diarrhea, constipation, dizziness, abdominal pain, skin rash, fevers, chills, night sweats, weight loss, swollen lymph nodes, body aches, joint swelling chest pain, shortness of breath, mood changes.  Mild positive muscle aches  Objective  Blood pressure (!) 140/96, pulse (!) 112, height 5\' 5"  (1.651 m), weight 159 lb (72.1 kg), SpO2 98 %.    General: No apparent distress alert and oriented x3 mood and affect normal, dressed appropriately.  HEENT: Pupils equal, extraocular movements intact  Respiratory: Patient's speak in full sentences and does not appear short of breath  Cardiovascular: No lower extremity edema, non tender, no erythema  Skin: Warm dry intact with no signs of infection or rash on extremities or on axial skeleton.  Abdomen: Soft nontender  Neuro: Cranial nerves II through XII are intact, neurovascularly intact in all  extremities with 2+ DTRs and 2+ pulses.  Lymph: No lymphadenopathy of posterior or anterior cervical chain or axillae bilaterally.  Gait normal with good balance and coordination.  MSK:  Non tender with full range of motion and good stability and symmetric strength and tone of shoulders, elbows, wrist, hip, knee and ankles bilaterally.  Knee: Left Normal to inspection with no erythema or effusion or obvious bony abnormalities. Palpation shows tenderness to palpation in the popliteal area.  Baker's cyst is larger than previous exam. ROM full in flexion and extension and lower leg rotation. Ligaments with solid consistent endpoints including ACL, PCL, LCL, MCL. Negative Mcmurray's, Apley's, and Thessalonian tests. Non painful patellar compression. Patellar glide with mild crepitus. Patellar and quadriceps tendons unremarkable. Hamstring and quadriceps strength is normal. Contralateral knee unremarkable Patient has very minimal tenderness in the calf but a negative Thompson.  Procedure: Real-time Ultrasound Guided Injection of left knee Baker's cyst aspiration Device: GE Logiq Q7 Ultrasound guided injection is preferred based studies that show increased duration, increased effect, greater accuracy, decreased procedural pain, increased response rate, and decreased cost with ultrasound guided versus blind injection.  Verbal informed consent obtained.  Time-out conducted.  Noted no overlying erythema, induration, or other signs of local infection.  Skin prepped in a sterile fashion.  Local anesthesia: Topical Ethyl chloride.  With sterile technique and under real time ultrasound guidance: With a 22-gauge 2 inch needle patient was injected with 4 cc of 0.5% Marcaine and aspirated 20 cc of straw-colored fluid then injected 1 cc of Kenalog 40 mg/dL. This was from a posterior approach.  Completed without difficulty  Pain immediately resolved suggesting accurate placement of the medication.  Advised  to call if fevers/chills, erythema, induration, drainage, or persistent bleeding.  Images permanently stored and available for review in the ultrasound unit.  Impression: Technically successful ultrasound guided injection.   Impression and Recommendations:     This case required medical decision making of moderate complexity. The above documentation has been reviewed and is accurate and complete Lyndal Pulley, DO       Note: This dictation was prepared with Dragon dictation along with smaller phrase technology. Any transcriptional errors that result from this process are unintentional.

## 2019-03-25 NOTE — Assessment & Plan Note (Signed)
Aspiration done today.  Tolerated the procedure well.  Discussed icing regimen and home exercises.  Discussed which activities to do which wants to avoid.  We discussed compression.  We discussed that with patient having anterior thigh pain and hip pain we may need to monitor closely if this is something else that is contributing.  Patient has had back pain and lumbar radiculopathy is within the differential.  Patient did have somewhat of a cloudy appearance of the fluids we will send down to make sure there is no uric acid crystals.  Continue all other medications.  Follow-up again in 2 weeks

## 2019-03-26 LAB — SYNOVIAL CELL COUNT + DIFF, W/ CRYSTALS
Basophils, %: 0 %
Eosinophils-Synovial: 0 % (ref 0–2)
Lymphocytes-Synovial Fld: 77 % — ABNORMAL HIGH (ref 0–74)
Monocyte/Macrophage: 17 % (ref 0–69)
Neutrophil, Synovial: 6 % (ref 0–24)
Synoviocytes, %: 0 % (ref 0–15)
WBC, Synovial: 473 cells/uL — ABNORMAL HIGH (ref <150)

## 2019-03-27 ENCOUNTER — Ambulatory Visit: Payer: BLUE CROSS/BLUE SHIELD | Admitting: Family Medicine

## 2019-04-01 DIAGNOSIS — Z1231 Encounter for screening mammogram for malignant neoplasm of breast: Secondary | ICD-10-CM | POA: Diagnosis not present

## 2019-04-01 LAB — HM MAMMOGRAPHY

## 2019-04-07 ENCOUNTER — Encounter: Payer: Self-pay | Admitting: Internal Medicine

## 2019-04-08 ENCOUNTER — Other Ambulatory Visit: Payer: Self-pay | Admitting: Internal Medicine

## 2019-04-15 ENCOUNTER — Encounter: Payer: Self-pay | Admitting: Family Medicine

## 2019-04-15 ENCOUNTER — Other Ambulatory Visit: Payer: Self-pay

## 2019-04-15 ENCOUNTER — Ambulatory Visit (INDEPENDENT_AMBULATORY_CARE_PROVIDER_SITE_OTHER): Payer: BC Managed Care – PPO | Admitting: Family Medicine

## 2019-04-15 DIAGNOSIS — M7122 Synovial cyst of popliteal space [Baker], left knee: Secondary | ICD-10-CM

## 2019-04-15 NOTE — Patient Instructions (Signed)
Good to see you See me again in 2 months just in case

## 2019-04-15 NOTE — Progress Notes (Signed)
[facility] [address] [place], [place] [address] Phone: [phone] Subjective:   I Barbara Gross am serving as a Education administrator for Dr. Hulan Saas.   CC: knee Pain follow-up  PTW:[id number]   03/25/2019 Aspiration done today.  Tolerated the procedure well.  Discussed icing regimen and home exercises.  Discussed which activities to do which wants to avoid.  We discussed compression.  We discussed that with patient having anterior thigh pain and hip pain we may need to monitor closely if this is something else that is contributing.  Patient has had back pain and lumbar radiculopathy is within the differential.  Patient did have somewhat of a cloudy appearance of the fluids we will send down to make sure there is no uric acid crystals.  Continue all other medications.  Follow-up again in 2 weeks  04/15/2019 Barbara Gross is a 63 y.o. female coming in with complaint of left knee pain. States that the knee is doing well. Still has a tingling sensation but the pain has improved.       Past Medical History:  Diagnosis Date  . ABDOMINAL PAIN, LOWER 05/22/2010  . Allergy    pollen   . ANXIETY 12/24/2007  . Arthritis    back , shoulder  . BACTERIAL VAGINITIS 12/24/2007  . BREAST MASS, LEFT 07/15/2008  . DEPRESSION 12/24/2007  . DIVERTICULITIS OF COLON 04/21/2010  . HYPERLIPIDEMIA 05/31/2007  . HYPERTENSION 05/31/2007  . Hypertension   . MENOPAUSE, EARLY 01/04/2010  . Microscopic hematuria 01/03/2009  . MIGRAINE HEADACHE 05/31/2007   Past Surgical History:  Procedure Laterality Date  . ABDOMINAL HYSTERECTOMY    . COLONOSCOPY    . EXPLORATORY LAPAROTOMY    . OOPHORECTOMY    . POLYPECTOMY     Social History   Socioeconomic History  . Marital status: Single    Spouse name: Not on file  . Number of children: Not on file  . Years of education: Not on file  . Highest education level: Not on file  Occupational History  . Occupation: Control and instrumentation engineer for [employer]  . Financial resource strain: Not on file  . Food insecurity    Worry: Not on file    Inability: Not on file  . Transportation needs    Medical: Not on file    Non-medical: Not on file  Tobacco Use  . Smoking status: Former Research scientist (life sciences)  . Smokeless tobacco: Never Used  . Tobacco comment: quit in the 80's  Substance and Sexual Activity  . Alcohol use: Yes    Alcohol/week: 14.0 standard drinks    Types: 14 Glasses of wine per week    Comment: 2 glasses of wine a night  . Drug use: No  . Sexual activity: Not on file  Lifestyle  . Physical activity    Days per week: Not on file    Minutes per session: Not on file  . Stress: Not on file  Relationships  . Social Herbalist on phone: Not on file    Gets together: Not on file    Attends religious service: Not on file    Active member of club or organization: Not on file    Attends meetings of clubs or organizations: Not on file    Relationship status: Not on file  Other Topics Concern  . Not on file  Social History Narrative  . Not on file   Allergies  Allergen Reactions  .  Codeine Nausea And Vomiting  . Hydrocodone Nausea And Vomiting  . Venlafaxine Nausea Only   Family History  Problem Relation Age of Onset  . Hypertension Mother   . Ulcers Mother        bleeding  . Hyperlipidemia Mother   . Sudden death Mother        bleeding ulcer 1995  . Diabetes Father   . Hypertension Father   . Hyperlipidemia Father   . Colon polyps Father   . Cancer Other        prostate  . Melanoma Other   . Colon cancer Other        dx'd in 22's  . Stroke Other   . Colon cancer Paternal Grandmother   . Esophageal cancer Neg Hx   . Rectal cancer Neg Hx   . Stomach cancer Neg Hx      Current Outpatient Medications (Cardiovascular):  .  amLODipine (NORVASC) 5 MG tablet, Take 1 tablet (5 mg total) by mouth daily. Marland Kitchen  losartan (COZAAR) 50 MG tablet, TAKE ONE TABLET BY MOUTH DAILY .  lovastatin (MEVACOR)  20 MG tablet, TAKE ONE TABLET BY MOUTH EVERY NIGHT AT BEDTIME  Current Outpatient Medications (Respiratory):  .  fluticasone (FLONASE) 50 MCG/ACT nasal spray, PLACE 2 SPRAYS INTO BOTH NOSTRILS DAILY.  Current Outpatient Medications (Analgesics):  .  aspirin 81 MG EC tablet, Take 81 mg by mouth daily.   .  meloxicam (MOBIC) 15 MG tablet, TAKE ONE TABLET BY MOUTH DAILY AS NEEDED FOR PAIN   Current Outpatient Medications (Other):  .  citalopram (CELEXA) 10 MG tablet, Take 1 tablet (10 mg total) by mouth daily. Marland Kitchen  gabapentin (NEURONTIN) 100 MG capsule, TAKE ONE CAPSULE BY MOUTH TWICE A DAY DURING DAYTIME HOURS .  gabapentin (NEURONTIN) 400 MG capsule, Take 1 capsule (400 mg total) by mouth at bedtime.    Past medical history, social, surgical and family history all reviewed in electronic medical record.  No pertanent information unless stated regarding to the chief complaint.   Review of Systems:  No headache, visual changes, nausea, vomiting, diarrhea, constipation, dizziness, abdominal pain, skin rash, fevers, chills, night sweats, weight loss, swollen lymph nodes, body aches, joint swelling, , chest pain, shortness of breath, mood changes.  Positive muscle aches  Objective  Blood pressure 100/82, pulse 74, height 5\' 5"  (1.651 m), weight 151 lb (68.5 kg), SpO2 91 %.   General: No apparent distress alert and oriented x3 mood and affect normal, dressed appropriately.  HEENT: Pupils equal, extraocular movements intact  Respiratory: Patient's speak in full sentences and does not appear short of breath  Cardiovascular: No lower extremity edema, non tender, no erythema  Skin: Warm dry intact with no signs of infection or rash on extremities or on axial skeleton.  Abdomen: Soft nontender  Neuro: Cranial nerves II through XII are intact, neurovascularly intact in all extremities with 2+ DTRs and 2+ pulses.  Lymph: No lymphadenopathy of posterior or anterior cervical chain or axillae  bilaterally.  Gait mild antalgic MSK:  Non tender with full range of motion and good stability and symmetric strength and tone of shoulders, elbows, wrist, hip, and ankles bilaterally.  Left knee exam shows the patient does have some mild discomfort still in the popliteal region.  Near full range of motion.  Patient does have significant varicose veins.  Near full range of motion with no significant instability.    Impression and Recommendations:     . The above documentation has  been reviewed and is accurate and complete Lyndal Pulley, DO       Note: This dictation was prepared with Dragon dictation along with smaller phrase technology. Any transcriptional errors that result from this process are unintentional.

## 2019-04-15 NOTE — Assessment & Plan Note (Signed)
Minimal reaccumulation at this time.  Discussed icing regimen and home exercises.  Discussed with her about compression with activity.  Follow-up again in 2 months

## 2019-04-22 ENCOUNTER — Other Ambulatory Visit (INDEPENDENT_AMBULATORY_CARE_PROVIDER_SITE_OTHER): Payer: BC Managed Care – PPO

## 2019-04-22 DIAGNOSIS — Z114 Encounter for screening for human immunodeficiency virus [HIV]: Secondary | ICD-10-CM

## 2019-04-22 DIAGNOSIS — Z0000 Encounter for general adult medical examination without abnormal findings: Secondary | ICD-10-CM

## 2019-04-22 LAB — URINALYSIS, ROUTINE W REFLEX MICROSCOPIC
Bilirubin Urine: NEGATIVE
Ketones, ur: NEGATIVE
Leukocytes,Ua: NEGATIVE
Nitrite: NEGATIVE
Specific Gravity, Urine: 1.025 (ref 1.000–1.030)
Urine Glucose: NEGATIVE
Urobilinogen, UA: 0.2 (ref 0.0–1.0)
pH: 6 (ref 5.0–8.0)

## 2019-04-22 LAB — BASIC METABOLIC PANEL
BUN: 19 mg/dL (ref 6–23)
CO2: 27 mEq/L (ref 19–32)
Calcium: 9.8 mg/dL (ref 8.4–10.5)
Chloride: 104 mEq/L (ref 96–112)
Creatinine, Ser: 0.64 mg/dL (ref 0.40–1.20)
GFR: 93.79 mL/min (ref >60.00)
Glucose, Bld: 95 mg/dL (ref 70–99)
Potassium: 4.4 mEq/L (ref 3.5–5.1)
Sodium: 139 mEq/L (ref 135–145)

## 2019-04-22 LAB — LIPID PANEL
Cholesterol: 197 mg/dL (ref 0–200)
HDL: 67.4 mg/dL (ref >39.00)
LDL Cholesterol: 101 mg/dL — ABNORMAL HIGH (ref 0–99)
NonHDL: 129.85
Total CHOL/HDL Ratio: 3
Triglycerides: 144 mg/dL (ref 0.0–149.0)
VLDL: 28.8 mg/dL (ref 0.0–40.0)

## 2019-04-22 LAB — HEPATIC FUNCTION PANEL
ALT: 23 U/L (ref 0–35)
AST: 21 U/L (ref 0–37)
Albumin: 4.5 g/dL (ref 3.5–5.2)
Alkaline Phosphatase: 55 U/L (ref 39–117)
Bilirubin, Direct: 0.1 mg/dL (ref 0.0–0.3)
Total Bilirubin: 0.4 mg/dL (ref 0.2–1.2)
Total Protein: 7.3 g/dL (ref 6.0–8.3)

## 2019-04-22 LAB — CBC WITH DIFFERENTIAL/PLATELET
Basophils Absolute: 0 10*3/uL (ref 0.0–0.1)
Basophils Relative: 0.5 % (ref 0.0–3.0)
Eosinophils Absolute: 0.2 10*3/uL (ref 0.0–0.7)
Eosinophils Relative: 3.1 % (ref 0.0–5.0)
HCT: 42.3 % (ref 36.0–46.0)
Hemoglobin: 14.3 g/dL (ref 12.0–15.0)
Lymphocytes Relative: 37.4 % (ref 12.0–46.0)
Lymphs Abs: 2.7 10*3/uL (ref 0.7–4.0)
MCHC: 33.9 g/dL (ref 30.0–36.0)
MCV: 102.2 fl — ABNORMAL HIGH (ref 78.0–100.0)
Monocytes Absolute: 0.7 10*3/uL (ref 0.1–1.0)
Monocytes Relative: 9.6 % (ref 3.0–12.0)
Neutro Abs: 3.6 10*3/uL (ref 1.4–7.7)
Neutrophils Relative %: 49.4 % (ref 43.0–77.0)
Platelets: 360 10*3/uL (ref 150.0–400.0)
RBC: 4.14 Mil/uL (ref 3.87–5.11)
RDW: 13.3 % (ref 11.5–15.5)
WBC: 7.2 10*3/uL (ref 4.0–10.5)

## 2019-04-22 LAB — TSH: TSH: 0.75 u[IU]/mL (ref 0.35–4.50)

## 2019-04-23 LAB — HIV ANTIBODY (ROUTINE TESTING W REFLEX): HIV 1&2 Ab, 4th Generation: NONREACTIVE

## 2019-04-28 ENCOUNTER — Encounter: Payer: Self-pay | Admitting: Internal Medicine

## 2019-04-28 ENCOUNTER — Ambulatory Visit (INDEPENDENT_AMBULATORY_CARE_PROVIDER_SITE_OTHER): Payer: BC Managed Care – PPO | Admitting: Internal Medicine

## 2019-04-28 ENCOUNTER — Other Ambulatory Visit: Payer: Self-pay

## 2019-04-28 VITALS — BP 124/68 | HR 72 | Temp 98.6°F | Ht 65.0 in | Wt 151.0 lb

## 2019-04-28 DIAGNOSIS — E538 Deficiency of other specified B group vitamins: Secondary | ICD-10-CM | POA: Diagnosis not present

## 2019-04-28 DIAGNOSIS — Z0000 Encounter for general adult medical examination without abnormal findings: Secondary | ICD-10-CM

## 2019-04-28 DIAGNOSIS — R739 Hyperglycemia, unspecified: Secondary | ICD-10-CM | POA: Diagnosis not present

## 2019-04-28 DIAGNOSIS — E611 Iron deficiency: Secondary | ICD-10-CM

## 2019-04-28 DIAGNOSIS — Z23 Encounter for immunization: Secondary | ICD-10-CM | POA: Diagnosis not present

## 2019-04-28 DIAGNOSIS — R3129 Other microscopic hematuria: Secondary | ICD-10-CM | POA: Diagnosis not present

## 2019-04-28 DIAGNOSIS — E559 Vitamin D deficiency, unspecified: Secondary | ICD-10-CM

## 2019-04-28 NOTE — Assessment & Plan Note (Signed)
Etiology unclear, for urology referral

## 2019-04-28 NOTE — Patient Instructions (Addendum)
You had the Tdap tetanus shot today  Please continue all other medications as before, and refills have been done if requested.  Please have the pharmacy call with any other refills you may need.  Please continue your efforts at being more active, low cholesterol diet, and weight control.  You are otherwise up to date with prevention measures today.  Please keep your appointments with your specialists as you may have planned  You will be contacted regarding the referral for: Urology  Please return in 1 year for your yearly visit, or sooner if needed, with Lab testing done 3-5 days before

## 2019-04-28 NOTE — Assessment & Plan Note (Signed)
stable overall by history and exam, recent data reviewed with pt, and pt to continue medical treatment as before,  to f/u any worsening symptoms or concerns  

## 2019-04-28 NOTE — Progress Notes (Signed)
Subjective:    Patient ID: Barbara Gross, female    DOB: 1955-10-29, 64 y.o.   MRN: [id number]  HPI  Here for wellness and f/u;  Overall doing ok;  Pt denies Chest pain, worsening SOB, DOE, wheezing, orthopnea, PND, worsening LE edema, palpitations, dizziness or syncope.  Pt denies neurological change such as new headache, facial or extremity weakness.  Pt denies polydipsia, polyuria, or low sugar symptoms. Pt states overall good compliance with treatment and medications, good tolerability, and has been trying to follow appropriate diet.  Pt denies worsening depressive symptoms, suicidal ideation or panic. No fever, night sweats, wt loss, loss of appetite, or other constitutional symptoms.  Pt states good ability with ADL's, has low fall risk, home safety reviewed and adequate, no other significant changes in hearing or vision, and only occasionally active with exercise. Denies urinary symptoms such as dysuria, frequency, urgency, flank pain, hematuria or n/v, fever, chills. Past Medical History:  Diagnosis Date  . ABDOMINAL PAIN, LOWER 05/22/2010  . Allergy    pollen   . ANXIETY 12/24/2007  . Arthritis    back , shoulder  . BACTERIAL VAGINITIS 12/24/2007  . BREAST MASS, LEFT 07/15/2008  . DEPRESSION 12/24/2007  . DIVERTICULITIS OF COLON 04/21/2010  . HYPERLIPIDEMIA 05/31/2007  . HYPERTENSION 05/31/2007  . Hypertension   . MENOPAUSE, EARLY 01/04/2010  . Microscopic hematuria 01/03/2009  . MIGRAINE HEADACHE 05/31/2007   Past Surgical History:  Procedure Laterality Date  . ABDOMINAL HYSTERECTOMY    . COLONOSCOPY    . EXPLORATORY LAPAROTOMY    . OOPHORECTOMY    . POLYPECTOMY      reports that she has quit smoking. She has never used smokeless tobacco. She reports current alcohol use of about 14.0 standard drinks of alcohol per week. She reports that she does not use drugs. family history includes Cancer in an other family member; Colon cancer in her paternal grandmother and another family  member; Colon polyps in her father; Diabetes in her father; Hyperlipidemia in her father and mother; Hypertension in her father and mother; Melanoma in an other family member; Stroke in an other family member; Sudden death in her mother; Ulcers in her mother. Allergies  Allergen Reactions  . Codeine Nausea And Vomiting  . Hydrocodone Nausea And Vomiting  . Venlafaxine Nausea Only   Current Outpatient Medications on File Prior to Visit  Medication Sig Dispense Refill  . amLODipine (NORVASC) 5 MG tablet Take 1 tablet (5 mg total) by mouth daily. 90 tablet 3  . aspirin 81 MG EC tablet Take 81 mg by mouth daily.      . citalopram (CELEXA) 10 MG tablet Take 1 tablet (10 mg total) by mouth daily. 90 tablet 3  . fluticasone (FLONASE) 50 MCG/ACT nasal spray PLACE 2 SPRAYS INTO BOTH NOSTRILS DAILY. 16 g 5  . gabapentin (NEURONTIN) 100 MG capsule TAKE ONE CAPSULE BY MOUTH TWICE A DAY DURING DAYTIME HOURS 180 capsule 1  . gabapentin (NEURONTIN) 400 MG capsule Take 1 capsule (400 mg total) by mouth at bedtime. 90 capsule 1  . losartan (COZAAR) 50 MG tablet TAKE ONE TABLET BY MOUTH DAILY 90 tablet 2  . lovastatin (MEVACOR) 20 MG tablet TAKE ONE TABLET BY MOUTH EVERY NIGHT AT BEDTIME 90 tablet 2  . meloxicam (MOBIC) 15 MG tablet TAKE ONE TABLET BY MOUTH DAILY AS NEEDED FOR PAIN 90 tablet 3   No current facility-administered medications on file prior to visit.    Review of Systems Constitutional:  Negative for other unusual diaphoresis, sweats, appetite or weight changes HENT: Negative for other worsening hearing loss, ear pain, facial swelling, mouth sores or neck stiffness.   Eyes: Negative for other worsening pain, redness or other visual disturbance.  Respiratory: Negative for other stridor or swelling Cardiovascular: Negative for other palpitations or other chest pain  Gastrointestinal: Negative for worsening diarrhea or loose stools, blood in stool, distention or other pain Genitourinary: Negative  for hematuria, flank pain or other change in urine volume.  Musculoskeletal: Negative for myalgias or other joint swelling.  Skin: Negative for other color change, or other wound or worsening drainage.  Neurological: Negative for other syncope or numbness. Hematological: Negative for other adenopathy or swelling Psychiatric/Behavioral: Negative for hallucinations, other worsening agitation, SI, self-injury, or new decreased concentration All other system neg per pt    Objective:   Physical Exam BP 124/68   Pulse 72   Temp 98.6 F (37 C) (Oral)   Ht 5\' 5"  (1.651 m)   Wt 151 lb (68.5 kg)   SpO2 98%   BMI 25.13 kg/m  VS noted,  Constitutional: Pt is oriented to person, place, and time. Appears well-developed and well-nourished, in no significant distress and comfortable Head: Normocephalic and atraumatic  Eyes: Conjunctivae and EOM are normal. Pupils are equal, round, and reactive to light Right Ear: External ear normal without discharge Left Ear: External ear normal without discharge Nose: Nose without discharge or deformity Mouth/Throat: Oropharynx is without other ulcerations and moist  Neck: Normal range of motion. Neck supple. No JVD present. No tracheal deviation present or significant neck LA or mass Cardiovascular: Normal rate, regular rhythm, normal heart sounds and intact distal pulses.   Pulmonary/Chest: WOB normal and breath sounds without rales or wheezing  Abdominal: Soft. Bowel sounds are normal. NT. No HSM  Musculoskeletal: Normal range of motion. Exhibits no edema Lymphadenopathy: Has no other cervical adenopathy.  Neurological: Pt is alert and oriented to person, place, and time. Pt has normal reflexes. No cranial nerve deficit. Motor grossly intact, Gait intact Skin: Skin is warm and dry. No rash noted or new ulcerations Psychiatric:  Has normal mood and affect. Behavior is normal without agitation No other exam findings Lab Results  Component Value Date   WBC  7.2 04/22/2019   HGB 14.3 04/22/2019   HCT 42.3 04/22/2019   PLT 360.0 04/22/2019   GLUCOSE 95 04/22/2019   CHOL 197 04/22/2019   TRIG 144.0 04/22/2019   HDL 67.40 04/22/2019   LDLCALC 101 (H) 04/22/2019   ALT 23 04/22/2019   AST 21 04/22/2019   NA 139 04/22/2019   K 4.4 04/22/2019   CL 104 04/22/2019   CREATININE 0.64 04/22/2019   BUN 19 04/22/2019   CO2 27 04/22/2019   TSH 0.75 04/22/2019       Assessment & Plan:

## 2019-04-28 NOTE — Assessment & Plan Note (Signed)

## 2019-05-25 ENCOUNTER — Other Ambulatory Visit: Payer: Self-pay | Admitting: Internal Medicine

## 2019-05-28 ENCOUNTER — Other Ambulatory Visit: Payer: Self-pay | Admitting: Internal Medicine

## 2019-06-03 ENCOUNTER — Other Ambulatory Visit: Payer: Self-pay | Admitting: Internal Medicine

## 2019-06-14 NOTE — Progress Notes (Signed)
[facility] [address] [place], [place] [address] Phone: [phone] Subjective:   I Barbara Gross am serving as a Education administrator for Dr. Hulan Saas.   CC: knee pain follow-up  [id number]   04/15/2019 Minimal reaccumulation at this time.  Discussed icing regimen and home exercises.  Discussed with her about compression with activity.  Follow-up again in 2 months  06/16/2019 Barbara Gross is a 63 y.o. female coming in with complaint of knee pain. States she is still experience knee pain. Right now the knee is throbbing.  Patient denies any instability but states that the knee pain seems to be worsening a little bit.  Patient denies any numbness, has not had any instability noted stop her from activity.  Symptoms of aching sensation at night       Past Medical History:  Diagnosis Date  . ABDOMINAL PAIN, LOWER 05/22/2010  . Allergy    pollen   . ANXIETY 12/24/2007  . Arthritis    back , shoulder  . BACTERIAL VAGINITIS 12/24/2007  . BREAST MASS, LEFT 07/15/2008  . DEPRESSION 12/24/2007  . DIVERTICULITIS OF COLON 04/21/2010  . HYPERLIPIDEMIA 05/31/2007  . HYPERTENSION 05/31/2007  . Hypertension   . MENOPAUSE, EARLY 01/04/2010  . Microscopic hematuria 01/03/2009  . MIGRAINE HEADACHE 05/31/2007   Past Surgical History:  Procedure Laterality Date  . ABDOMINAL HYSTERECTOMY    . COLONOSCOPY    . EXPLORATORY LAPAROTOMY    . OOPHORECTOMY    . POLYPECTOMY     Social History   Socioeconomic History  . Marital status: Single    Spouse name: Not on file  . Number of children: Not on file  . Years of education: Not on file  . Highest education level: Not on file  Occupational History  . Occupation: Control and instrumentation engineer for [employer]  . Financial resource strain: Not on file  . Food insecurity    Worry: Not on file    Inability: Not on file  . Transportation needs    Medical: Not on file    Non-medical: Not on file  Tobacco Use  .  Smoking status: Former Research scientist (life sciences)  . Smokeless tobacco: Never Used  . Tobacco comment: quit in the 80's  Substance and Sexual Activity  . Alcohol use: Yes    Alcohol/week: 14.0 standard drinks    Types: 14 Glasses of wine per week    Comment: 2 glasses of wine a night  . Drug use: No  . Sexual activity: Not on file  Lifestyle  . Physical activity    Days per week: Not on file    Minutes per session: Not on file  . Stress: Not on file  Relationships  . Social Herbalist on phone: Not on file    Gets together: Not on file    Attends religious service: Not on file    Active member of club or organization: Not on file    Attends meetings of clubs or organizations: Not on file    Relationship status: Not on file  Other Topics Concern  . Not on file  Social History Narrative  . Not on file   Allergies  Allergen Reactions  . Codeine Nausea And Vomiting  . Hydrocodone Nausea And Vomiting  . Venlafaxine Nausea Only   Family History  Problem Relation Age of Onset  . Hypertension Mother   . Ulcers Mother        bleeding  .  Hyperlipidemia Mother   . Sudden death Mother        bleeding ulcer 1995  . Diabetes Father   . Hypertension Father   . Hyperlipidemia Father   . Colon polyps Father   . Cancer Other        prostate  . Melanoma Other   . Colon cancer Other        dx'd in 16's  . Stroke Other   . Colon cancer Paternal Grandmother   . Esophageal cancer Neg Hx   . Rectal cancer Neg Hx   . Stomach cancer Neg Hx      Current Outpatient Medications (Cardiovascular):  .  amLODipine (NORVASC) 5 MG tablet, Take 1 tablet (5 mg total) by mouth daily. Marland Kitchen  losartan (COZAAR) 50 MG tablet, TAKE ONE TABLET BY MOUTH DAILY .  lovastatin (MEVACOR) 20 MG tablet, TAKE ONE TABLET BY MOUTH EVERY NIGHT AT BEDTIME  Current Outpatient Medications (Respiratory):  .  fluticasone (FLONASE) 50 MCG/ACT nasal spray, PLACE 2 SPRAYS INTO BOTH NOSTRILS DAILY.  Current Outpatient  Medications (Analgesics):  .  aspirin 81 MG EC tablet, Take 81 mg by mouth daily.   .  meloxicam (MOBIC) 15 MG tablet, TAKE ONE TABLET BY MOUTH DAILY AS NEEDED FOR PAIN   Current Outpatient Medications (Other):  .  citalopram (CELEXA) 10 MG tablet, TAKE ONE TABLET BY MOUTH DAILY .  gabapentin (NEURONTIN) 100 MG capsule, TAKE ONE CAPSULE BY MOUTH TWICE A DAY. DURING DAYTIME HOURS .  gabapentin (NEURONTIN) 400 MG capsule, Take 1 capsule (400 mg total) by mouth at bedtime.    Past medical history, social, surgical and family history all reviewed in electronic medical record.  No pertanent information unless stated regarding to the chief complaint.   Review of Systems:  No headache, visual changes, nausea, vomiting, diarrhea, constipation, dizziness, abdominal pain, skin rash, fevers, chills, night sweats, weight loss, swollen lymph nodes, body aches, joint swelling, muscle aches, chest pain, shortness of breath, mood changes.   Objective  Blood pressure 130/84, pulse (!) 101, height 5\' 5"  (1.651 m), weight 152 lb (68.9 kg), SpO2 97 %.    General: No apparent distress alert and oriented x3 mood and affect normal, dressed appropriately.  HEENT: Pupils equal, extraocular movements intact  Respiratory: Patient's speak in full sentences and does not appear short of breath  Cardiovascular: No lower extremity edema, non tender, no erythema  Skin: Warm dry intact with no signs of infection or rash on extremities or on axial skeleton.  Abdomen: Soft nontender  Neuro: Cranial nerves II through XII are intact, neurovascularly intact in all extremities with 2+ DTRs and 2+ pulses.  Lymph: No lymphadenopathy of posterior or anterior cervical chain or axillae bilaterally.  Gait normal with good balance and coordination.  MSK:  Non tender with full range of motion and good stability and symmetric strength and tone of shoulders, elbows, wrist, hip, knee and ankles bilaterally.  Knee: Left knee Normal to  inspection with no erythema or effusion or obvious bony abnormalities. Palpation tender over the medial joint line but no Baker's cyst palpated this time. ROM full in flexion and extension and lower leg rotation. Ligaments with solid consistent endpoints including ACL, PCL, LCL, MCL. Negative Mcmurray's, Apley's, and Thessalonian tests. painful patellar compression. Patellar glide without crepitus. Patellar and quadriceps tendons unremarkable. Hamstring and quadriceps strength is normal.  Limited musculoskeletal ultrasound shows the patient does have some narrowing of the patellofemoral joint and the medial joint line but no  Baker cyst noted this time.  Patient does have what appears to be a very small injury to the calf the patient is nontender in that area  After informed written and verbal consent, patient was seated on exam table. Left knee was prepped with alcohol swab and utilizing anterolateral approach, patient's left knee space was injected with 4:1  marcaine 0.5%: Kenalog 40mg /dL. Patient tolerated the procedure well without immediate complications.   Impression and Recommendations:     This case required medical decision making of moderate complexity. The above documentation has been reviewed and is accurate and complete Lyndal Pulley, DO       Note: This dictation was prepared with Dragon dictation along with smaller phrase technology. Any transcriptional errors that result from this process are unintentional.

## 2019-06-16 ENCOUNTER — Other Ambulatory Visit: Payer: Self-pay

## 2019-06-16 ENCOUNTER — Ambulatory Visit (INDEPENDENT_AMBULATORY_CARE_PROVIDER_SITE_OTHER)
Admission: RE | Admit: 2019-06-16 | Discharge: 2019-06-16 | Disposition: A | Discharge status: Home / Self Care | Payer: BC Managed Care – PPO | Source: Ambulatory Visit | Attending: Family Medicine | Admitting: Family Medicine

## 2019-06-16 ENCOUNTER — Encounter: Payer: Self-pay | Admitting: Family Medicine

## 2019-06-16 ENCOUNTER — Ambulatory Visit (INDEPENDENT_AMBULATORY_CARE_PROVIDER_SITE_OTHER): Payer: BC Managed Care – PPO | Admitting: Family Medicine

## 2019-06-16 ENCOUNTER — Ambulatory Visit: Payer: Self-pay

## 2019-06-16 VITALS — BP 130/84 | HR 101 | Ht 65.0 in | Wt 152.0 lb

## 2019-06-16 DIAGNOSIS — M25562 Pain in left knee: Secondary | ICD-10-CM

## 2019-06-16 DIAGNOSIS — G8929 Other chronic pain: Secondary | ICD-10-CM

## 2019-06-16 NOTE — Patient Instructions (Signed)
See me again in 5-6 weeks 

## 2019-06-16 NOTE — Assessment & Plan Note (Addendum)
Left knee pain.  On ultrasound the patient does have some mild to moderate arthritic changes but nothing severe.  Baker's cyst did not seem to be as much of a concern this time.  We discussed topical anti-inflammatories and icing regimen, discussed which activities to do which wants to avoid.  Patient is to increase activity slowly.  Ending.  Worsening symptoms depending on the amount of arthritis advanced imaging could be warranted.  Due to the irregularity noted on ultrasound of patient's calf we did discuss the possibility of a Doppler which patient declined

## 2019-07-04 ENCOUNTER — Other Ambulatory Visit: Payer: Self-pay | Admitting: Internal Medicine

## 2019-07-04 ENCOUNTER — Other Ambulatory Visit: Payer: Self-pay | Admitting: Family Medicine

## 2019-07-16 ENCOUNTER — Other Ambulatory Visit: Payer: Self-pay

## 2019-07-16 ENCOUNTER — Ambulatory Visit (INDEPENDENT_AMBULATORY_CARE_PROVIDER_SITE_OTHER): Payer: BC Managed Care – PPO

## 2019-07-16 DIAGNOSIS — Z23 Encounter for immunization: Secondary | ICD-10-CM | POA: Diagnosis not present

## 2019-08-25 ENCOUNTER — Encounter: Payer: Self-pay | Admitting: Family Medicine

## 2019-08-25 ENCOUNTER — Other Ambulatory Visit: Payer: Self-pay

## 2019-08-25 MED ORDER — GABAPENTIN 400 MG PO CAPS: 400.0000 mg | ORAL_CAPSULE | Freq: Every day | ORAL | 0 refills | Status: DC

## 2019-09-03 ENCOUNTER — Encounter: Payer: Self-pay | Admitting: Internal Medicine

## 2019-09-15 ENCOUNTER — Other Ambulatory Visit: Payer: Self-pay | Admitting: Internal Medicine

## 2019-10-13 DIAGNOSIS — R3121 Asymptomatic microscopic hematuria: Secondary | ICD-10-CM | POA: Diagnosis not present

## 2019-10-15 DIAGNOSIS — N281 Cyst of kidney, acquired: Secondary | ICD-10-CM | POA: Diagnosis not present

## 2019-10-15 DIAGNOSIS — R3121 Asymptomatic microscopic hematuria: Secondary | ICD-10-CM | POA: Diagnosis not present

## 2019-10-29 DIAGNOSIS — B9620 Unspecified Escherichia coli [E. coli] as the cause of diseases classified elsewhere: Secondary | ICD-10-CM | POA: Diagnosis not present

## 2019-10-29 DIAGNOSIS — N390 Urinary tract infection, site not specified: Secondary | ICD-10-CM | POA: Diagnosis not present

## 2019-10-29 DIAGNOSIS — R3121 Asymptomatic microscopic hematuria: Secondary | ICD-10-CM | POA: Diagnosis not present

## 2019-11-23 ENCOUNTER — Other Ambulatory Visit: Payer: Self-pay | Admitting: Family Medicine

## 2019-11-23 ENCOUNTER — Other Ambulatory Visit: Payer: Self-pay | Admitting: Internal Medicine

## 2019-11-23 NOTE — Telephone Encounter (Signed)
Please refill as per office routine med refill policy (all routine meds refilled for 3 mo or monthly per pt preference up to one year from last visit, then month to month grace period for 3 mo, then further med refills will have to be denied)  

## 2019-12-14 ENCOUNTER — Other Ambulatory Visit: Payer: Self-pay | Admitting: Family Medicine

## 2019-12-14 ENCOUNTER — Other Ambulatory Visit: Payer: Self-pay | Admitting: Internal Medicine

## 2019-12-14 NOTE — Telephone Encounter (Signed)
Please refill as per office routine med refill policy (all routine meds refilled for 3 mo or monthly per pt preference up to one year from last visit, then month to month grace period for 3 mo, then further med refills will have to be denied)  

## 2020-01-01 ENCOUNTER — Other Ambulatory Visit: Payer: Self-pay | Admitting: Family Medicine

## 2020-01-28 DIAGNOSIS — D414 Neoplasm of uncertain behavior of bladder: Secondary | ICD-10-CM | POA: Diagnosis not present

## 2020-01-28 DIAGNOSIS — R3121 Asymptomatic microscopic hematuria: Secondary | ICD-10-CM | POA: Diagnosis not present

## 2020-02-03 ENCOUNTER — Other Ambulatory Visit: Payer: Self-pay | Admitting: Urology

## 2020-02-08 ENCOUNTER — Encounter: Payer: Self-pay | Admitting: Family Medicine

## 2020-02-08 ENCOUNTER — Other Ambulatory Visit: Payer: Self-pay

## 2020-02-08 MED ORDER — GABAPENTIN 400 MG PO CAPS: 400.0000 mg | ORAL_CAPSULE | Freq: Every day | ORAL | 0 refills | Status: DC

## 2020-02-22 ENCOUNTER — Encounter (HOSPITAL_BASED_OUTPATIENT_CLINIC_OR_DEPARTMENT_OTHER): Payer: Self-pay | Admitting: Urology

## 2020-02-22 ENCOUNTER — Other Ambulatory Visit: Payer: Self-pay

## 2020-02-22 NOTE — Progress Notes (Signed)
Spoke w/ via phone for pre-op interview---patient Lab needs dos----  I stat 8, ekg           COVID test ------02-26-2020@ 1450 Arrive at -------1030 am 03-01-2020 NPO after ------midnight Medications to take morning of surgery -----amlodipine Diabetic medication -----n/a Patient Special Instructions -----none Pre-Op special Istructions -----none Patient verbalized understanding of instructions that were given at this phone interview. Patient denies shortness of breath, chest pain, fever, cough a this phone interview.

## 2020-02-26 ENCOUNTER — Other Ambulatory Visit (HOSPITAL_COMMUNITY)
Admission: RE | Admit: 2020-02-26 | Discharge: 2020-02-26 | Disposition: A | Discharge status: Home / Self Care | Payer: BC Managed Care – PPO | Source: Ambulatory Visit | Attending: Urology | Admitting: Urology

## 2020-02-26 DIAGNOSIS — Z20822 Contact with and (suspected) exposure to covid-19: Secondary | ICD-10-CM | POA: Diagnosis not present

## 2020-02-26 DIAGNOSIS — Z01812 Encounter for preprocedural laboratory examination: Secondary | ICD-10-CM | POA: Diagnosis not present

## 2020-02-26 LAB — SARS CORONAVIRUS 2 (TAT 6-24 HRS): SARS Coronavirus 2: NEGATIVE

## 2020-03-01 ENCOUNTER — Encounter (HOSPITAL_BASED_OUTPATIENT_CLINIC_OR_DEPARTMENT_OTHER): Payer: Self-pay | Admitting: Urology

## 2020-03-01 ENCOUNTER — Other Ambulatory Visit: Payer: Self-pay

## 2020-03-01 ENCOUNTER — Encounter (HOSPITAL_BASED_OUTPATIENT_CLINIC_OR_DEPARTMENT_OTHER)
Admission: RE | Disposition: A | Discharge status: Home / Self Care | Payer: Self-pay | Source: Home / Self Care | Attending: Urology

## 2020-03-01 ENCOUNTER — Ambulatory Visit (HOSPITAL_BASED_OUTPATIENT_CLINIC_OR_DEPARTMENT_OTHER): Payer: BC Managed Care – PPO | Admitting: Anesthesiology

## 2020-03-01 ENCOUNTER — Ambulatory Visit (HOSPITAL_BASED_OUTPATIENT_CLINIC_OR_DEPARTMENT_OTHER)
Admission: RE | Admit: 2020-03-01 | Discharge: 2020-03-01 | Disposition: A | Discharge status: Home / Self Care | Payer: BC Managed Care – PPO | Attending: Urology | Admitting: Urology

## 2020-03-01 DIAGNOSIS — F418 Other specified anxiety disorders: Secondary | ICD-10-CM | POA: Diagnosis not present

## 2020-03-01 DIAGNOSIS — N3020 Other chronic cystitis without hematuria: Secondary | ICD-10-CM | POA: Diagnosis not present

## 2020-03-01 DIAGNOSIS — I10 Essential (primary) hypertension: Secondary | ICD-10-CM | POA: Insufficient documentation

## 2020-03-01 DIAGNOSIS — N329 Bladder disorder, unspecified: Secondary | ICD-10-CM | POA: Diagnosis not present

## 2020-03-01 DIAGNOSIS — M1990 Unspecified osteoarthritis, unspecified site: Secondary | ICD-10-CM | POA: Insufficient documentation

## 2020-03-01 DIAGNOSIS — N3289 Other specified disorders of bladder: Secondary | ICD-10-CM | POA: Diagnosis not present

## 2020-03-01 DIAGNOSIS — R3121 Asymptomatic microscopic hematuria: Secondary | ICD-10-CM | POA: Diagnosis not present

## 2020-03-01 DIAGNOSIS — N3081 Other cystitis with hematuria: Secondary | ICD-10-CM | POA: Diagnosis not present

## 2020-03-01 DIAGNOSIS — E785 Hyperlipidemia, unspecified: Secondary | ICD-10-CM | POA: Diagnosis not present

## 2020-03-01 DIAGNOSIS — R3129 Other microscopic hematuria: Secondary | ICD-10-CM | POA: Diagnosis not present

## 2020-03-01 HISTORY — PX: CYSTOSCOPY WITH BIOPSY: SHX5122

## 2020-03-01 HISTORY — DX: Other specified postprocedural states: Z98.890

## 2020-03-01 HISTORY — DX: Other specified postprocedural states: R11.2

## 2020-03-01 HISTORY — DX: Bladder disorder, unspecified: N32.9

## 2020-03-01 HISTORY — DX: Personal history of other diseases of the nervous system and sense organs: Z86.69

## 2020-03-01 LAB — POCT I-STAT, CHEM 8
BUN: 14 mg/dL (ref 8–23)
Calcium, Ion: 1.34 mmol/L (ref 1.15–1.40)
Chloride: 104 mmol/L (ref 98–111)
Creatinine, Ser: 0.5 mg/dL (ref 0.44–1.00)
Glucose, Bld: 114 mg/dL — ABNORMAL HIGH (ref 70–99)
HCT: 47 % — ABNORMAL HIGH (ref 36.0–46.0)
Hemoglobin: 16 g/dL — ABNORMAL HIGH (ref 12.0–15.0)
Potassium: 4 mmol/L (ref 3.5–5.1)
Sodium: 142 mmol/L (ref 135–145)
TCO2: 24 mmol/L (ref 22–32)

## 2020-03-01 SURGERY — CYSTOSCOPY, WITH BIOPSY
Anesthesia: General | Site: Bladder

## 2020-03-01 MED ORDER — STERILE WATER FOR IRRIGATION IR SOLN
Status: DC | PRN
  Administered 2020-03-01: 3000 mL via INTRAVESICAL

## 2020-03-01 MED ORDER — PROPOFOL 10 MG/ML IV BOLUS
INTRAVENOUS | Status: AC
  Filled 2020-03-01: qty 20

## 2020-03-01 MED ORDER — SODIUM CHLORIDE 0.9 % IV SOLN
2.0000 g | INTRAVENOUS | Status: AC
  Administered 2020-03-01: 2 g via INTRAVENOUS

## 2020-03-01 MED ORDER — CEFTRIAXONE SODIUM 2 G IJ SOLR
INTRAMUSCULAR | Status: AC
  Filled 2020-03-01: qty 20

## 2020-03-01 MED ORDER — FENTANYL CITRATE (PF) 100 MCG/2ML IJ SOLN
INTRAMUSCULAR | Status: DC | PRN
  Administered 2020-03-01 (×2): 50 ug via INTRAVENOUS

## 2020-03-01 MED ORDER — CHLORHEXIDINE GLUCONATE 0.12 % MT SOLN: 15.0000 mL | Freq: Once | OROMUCOSAL | Status: DC

## 2020-03-01 MED ORDER — LIDOCAINE 2% (20 MG/ML) 5 ML SYRINGE
INTRAMUSCULAR | Status: AC
  Filled 2020-03-01: qty 5

## 2020-03-01 MED ORDER — ONDANSETRON HCL 4 MG/2ML IJ SOLN
INTRAMUSCULAR | Status: AC
  Filled 2020-03-01: qty 2

## 2020-03-01 MED ORDER — MIDAZOLAM HCL 5 MG/5ML IJ SOLN
INTRAMUSCULAR | Status: DC | PRN
  Administered 2020-03-01: 2 mg via INTRAVENOUS

## 2020-03-01 MED ORDER — PROPOFOL 10 MG/ML IV BOLUS
INTRAVENOUS | Status: DC | PRN
  Administered 2020-03-01: 150 mg via INTRAVENOUS
  Administered 2020-03-01: 40 mg via INTRAVENOUS

## 2020-03-01 MED ORDER — PHENAZOPYRIDINE HCL 200 MG PO TABS
200.0000 mg | ORAL_TABLET | Freq: Three times a day (TID) | ORAL | 0 refills | Status: DC | PRN
Start: 2020-03-01 — End: 2020-04-28

## 2020-03-01 MED ORDER — FENTANYL CITRATE (PF) 100 MCG/2ML IJ SOLN
INTRAMUSCULAR | Status: AC
  Filled 2020-03-01: qty 2

## 2020-03-01 MED ORDER — ORAL CARE MOUTH RINSE: 15.0000 mL | Freq: Once | OROMUCOSAL | Status: DC

## 2020-03-01 MED ORDER — SODIUM CHLORIDE 0.9 % IV SOLN: INTRAVENOUS | Status: DC

## 2020-03-01 MED ORDER — MIDAZOLAM HCL 2 MG/2ML IJ SOLN
INTRAMUSCULAR | Status: AC
  Filled 2020-03-01: qty 2

## 2020-03-01 MED ORDER — LIDOCAINE 2% (20 MG/ML) 5 ML SYRINGE
INTRAMUSCULAR | Status: DC | PRN
  Administered 2020-03-01: 100 mg via INTRAVENOUS

## 2020-03-01 MED ORDER — DEXAMETHASONE SODIUM PHOSPHATE 10 MG/ML IJ SOLN
INTRAMUSCULAR | Status: DC | PRN
  Administered 2020-03-01: 10 mg via INTRAVENOUS

## 2020-03-01 MED ORDER — ONDANSETRON HCL 4 MG/2ML IJ SOLN
INTRAMUSCULAR | Status: DC | PRN
  Administered 2020-03-01: 4 mg via INTRAVENOUS

## 2020-03-01 MED ORDER — DEXAMETHASONE SODIUM PHOSPHATE 10 MG/ML IJ SOLN
INTRAMUSCULAR | Status: AC
  Filled 2020-03-01: qty 1

## 2020-03-01 MED ORDER — SODIUM CHLORIDE 0.9 % IV SOLN
INTRAVENOUS | Status: AC
  Filled 2020-03-01: qty 100

## 2020-03-01 SURGICAL SUPPLY — 17 items
BAG DRAIN URO-CYSTO SKYTR STRL (DRAIN) ×3 IMPLANT
BAG DRN RND TRDRP ANRFLXCHMBR (UROLOGICAL SUPPLIES)
BAG DRN UROCATH (DRAIN) ×2
BAG URINE DRAIN 2000ML AR STRL (UROLOGICAL SUPPLIES) IMPLANT
CLOTH BEACON ORANGE TIMEOUT ST (SAFETY) ×3 IMPLANT
ELECT REM PT RETURN 9FT ADLT (ELECTROSURGICAL) ×3
ELECTRODE REM PT RTRN 9FT ADLT (ELECTROSURGICAL) ×2 IMPLANT
GLOVE BIO SURGEON STRL SZ 6.5 (GLOVE) ×4 IMPLANT
GLOVE BIOGEL PI IND STRL 7.0 (GLOVE) IMPLANT
GLOVE BIOGEL PI INDICATOR 7.0 (GLOVE) ×2
GOWN STRL REUS W/TWL LRG LVL3 (GOWN DISPOSABLE) ×4 IMPLANT
KIT TURNOVER CYSTO (KITS) ×3 IMPLANT
LOOP CUT BIPOLAR 24F LRG (ELECTROSURGICAL) IMPLANT
MANIFOLD NEPTUNE II (INSTRUMENTS) ×3 IMPLANT
TRAY CYSTO PACK (CUSTOM PROCEDURE TRAY) ×3 IMPLANT
TUBE CONNECTING 12X1/4 (SUCTIONS) ×3 IMPLANT
TUBING UROLOGY SET (TUBING) ×2 IMPLANT

## 2020-03-01 NOTE — Discharge Instructions (Signed)
Post Bladder Surgery Instructions   General instructions:     Your recent bladder surgery requires very little post hospital care but some definite precautions.  Despite the fact that no skin incisions were used, the area around the bladder incisions are raw and covered with scabs to promote healing and prevent bleeding. Certain precautions are needed to insure that the scabs are not disturbed over the next 2-4 weeks while the healing proceeds.  Because the raw surface inside your bladder and the irritating effects of urine you may expect frequency of urination and/or urgency (a stronger desire to urinate) and perhaps even getting up at night more often. This will usually resolve or improve slowly over the healing period. You may see some blood in your urine over the first 6 weeks. Do not be alarmed, even if the urine was clear for a while. Get off your feet and drink lots of fluids until clearing occurs. If you start to pass clots or don't improve call us.  1. You may also have blood in your urine, even after it has been clear for  several days; you may even pass some small blood clots or other material.  This  is normal as well.  If this happens, sit down and drink plenty of water to help  make urine to flush out your bladder.  If the blood in your urine becomes worse  after doing this, contact our office or return to the ER.  Diet:  You may return to your normal diet immediately. Because of the raw surface of your bladder, alcohol, spicy foods, foods high in acid and drinks with caffeine may cause irritation or frequency and should be used in moderation. To keep your urine flowing freely and avoid constipation, drink plenty of fluids during the day (8-10 glasses). Tip: Avoid cranberry juice because it is very acidic.  Activity:  Your physical activity doesn't need to be restricted. However, if you are very active, you may see some blood in the urine. We suggest that you reduce your activity  under the circumstances until the bleeding has stopped.  Bowels:  It is important to keep your bowels regular during the postoperative period. Straining with bowel movements can cause bleeding. A bowel movement every other day is reasonable. Use a mild laxative if needed, such as milk of magnesia 2-3 tablespoons, or 2 Dulcolax tablets. Call if you continue to have problems. If you had been taking narcotics for pain, before, during or after your surgery, you may be constipated. Take a laxative if necessary.    Medication:  You should resume your pre-surgery medications unless told not to. In addition you may be given an antibiotic to prevent or treat infection. Antibiotics are not always necessary. All medication should be taken as prescribed until the bottles are finished unless you are having an unusual reaction to one of the drugs.   Post Anesthesia Home Care Instructions  Activity: Get plenty of rest for the remainder of the day. A responsible individual must stay with you for 24 hours following the procedure.  For the next 24 hours, DO NOT: -Drive a car -Paediatric nurse -Drink alcoholic beverages -Take any medication unless instructed by your physician -Make any legal decisions or sign important papers.  Meals: Start with liquid foods such as gelatin or soup. Progress to regular foods as tolerated. Avoid greasy, spicy, heavy foods. If nausea and/or vomiting occur, drink only clear liquids until the nausea and/or vomiting subsides. Call your physician if vomiting  vomiting continues.  Special Instructions/Symptoms: Your throat may feel dry or sore from the anesthesia or the breathing tube placed in your throat during surgery. If this causes discomfort, gargle with warm salt water. The discomfort should disappear within 24 hours.   

## 2020-03-01 NOTE — Interval H&P Note (Signed)
History and Physical Interval Note:  03/01/2020 10:45 AM  Hazel Sams  has presented today for surgery, with the diagnosis of BLADDER LESION.  The various methods of treatment have been discussed with the patient and family. After consideration of risks, benefits and other options for treatment, the patient has consented to  Procedure(s) with comments: CYSTOSCOPY WITH BIOPSY (N/A) TRANSURETHRAL RESECTION OF BLADDER TUMOR (TURBT) (N/A) - 30 MINS as a surgical intervention.  The patient's history has been reviewed, patient examined, no change in status, stable for surgery.  I have reviewed the patient's chart and labs.  Questions were answered to the patient's satisfaction.     Trevian Hayashida D Sheylin Scharnhorst

## 2020-03-01 NOTE — Anesthesia Procedure Notes (Signed)
Procedure Name: LMA Insertion Date/Time: 03/01/2020 11:21 AM Performed by: Bonney Aid, CRNA Pre-anesthesia Checklist: Patient identified, Emergency Drugs available, Suction available and Patient being monitored Patient Re-evaluated:Patient Re-evaluated prior to induction Oxygen Delivery Method: Circle system utilized Preoxygenation: Pre-oxygenation with 100% oxygen Induction Type: IV induction Ventilation: Mask ventilation without difficulty LMA: LMA inserted LMA Size: 4.0 Number of attempts: 1 Airway Equipment and Method: Bite block Placement Confirmation: positive ETCO2 Tube secured with: Tape Dental Injury: Teeth and Oropharynx as per pre-operative assessment

## 2020-03-01 NOTE — Transfer of Care (Signed)
Immediate Anesthesia Transfer of Care Note  Patient: [name]  Procedure(s) Performed: CYSTOSCOPY WITH BIOPSY, FULGERATION BLADDER (N/A Bladder)  Patient Location: PACU  Anesthesia Type:General  Level of Consciousness: awake, alert  and oriented  Airway & Oxygen Therapy: Patient Spontanous Breathing and Patient connected to nasal cannula oxygen  Post-op Assessment: Report given to RN  Post vital signs: Reviewed and stable  Last Vitals:  Vitals Value Taken Time  BP 142/93 03/01/20 1150  Temp    Pulse 80 03/01/20 1152  Resp 12 03/01/20 1152  SpO2 100 % 03/01/20 1152  Vitals shown include unvalidated device data.  Last Pain:  Vitals:   03/01/20 1108  TempSrc: Oral  PainSc: 0-No pain      Patients Stated Pain Goal: 5 ([phone] [phone])  Complications: No apparent anesthesia complications

## 2020-03-01 NOTE — H&P (Signed)
CC/HPI: cc: cystoscopy   01/28/20: repeat cystoscopy   10/29/19: 64 yo woman with microscopic hematuria. Pt had negative hematuria work up in 2010 by Dr. Diona Fanti for asymptomatic microscopic hematuria. Most recent outside records show 7 RBC/HPF on UA. She has a remote smoking history (10 pack year hx in the 1970s). Patient denies history of UTIs, gross hematuria, kidney stones or family history of bladder or kidney cancer.     GU PSH: Cystoscopy - 10/29/2019 Hysterectomy Unilat SO - 2010 Locm 300-399Mg /Ml Iodine,1Ml - 10/15/2019       PSH Notes: Hysterectomy   NON-GU PSH: None   GU PMH: Microscopic hematuria (Stable), Patient has had microscopic hematuria in the past which is required to workup. CT urogram showed bilateral renal cyst and also some small nonspecific low-attenuation lesions too small to characterize. Cystoscopy today revealed some mild erythema and we will re-evaluate with repeat scope in about 3 months. If she has persistent hematuria moving forward we will get an MRI of the abdomen pelvis as recommended by radiology report. Also discussed the pulmonary findings with the patient. - 10/29/2019, We discussed possible sources of hematuria including kidneys, ureters bladder or urethra. Based on risk stratification solely by patient's age she is in high risk category. Her last hematuria workup was over 10 years ago. We will proceed with a CT urogram to evaluate her upper tracts a cystoscopy for her lower tracts. Patient had lab work and July 2020 that showed adequate renal function., - 10/13/2019 Other microscopic hematuria, Microscopic hematuria - 2014      PMH Notes:  1898-10-01 00:00:00 - Note: Normal Routine History And Physical Adult  2009-01-20 09:02:33 - Note: Peptic Ulcer   NON-GU PMH: Personal history of other diseases of the circulatory system, History of hypertension - 2014 Personal history of other endocrine, nutritional and metabolic disease, History of  hypercholesterolemia - 2014 Arthritis Depression Hypercholesterolemia Hypertension    FAMILY HISTORY: Death In The Family Mother - Mother Diabetes - Father Ulcer - Mother   SOCIAL HISTORY: Marital Status: Divorced Preferred Language: English; Ethnicity: Not Hispanic Or Latino; Race: White Current Smoking Status: Patient does not smoke anymore. Has not smoked since 10/01/1984.   Tobacco Use Assessment Completed: Used Tobacco in last 30 days? Social Drinker.  Drinks 2 caffeinated drinks per day.     Notes: Tobacco Use, Alcohol Use, Marital History - Divorced, Occupation:, Caffeine Use   PROCEDURES:         Flexible Cystoscopy - 52000  Risks, benefits, and some of the potential complications of the procedure were discussed at length with the patient including infection, bleeding, voiding discomfort, urinary retention, fever, chills, sepsis, and others. All questions were answered. Informed consent was obtained. Sterile technique and intraurethral analgesia were used.  Meatus:  Normal size. Normal location. Normal condition.  Urethra:  No hypermobility. No leakage.  Ureteral Orifices:  Normal location. Normal size. Normal shape. Effluxed clear urine.  Bladder:  No trabeculation. Bumpy, small nodule just proximal to bladder neck also with small area of erythema lateral to right ureteral orifice      The lower urinary tract was carefully examined. The procedure was well-tolerated and without complications. Antibiotic instructions were given. Instructions were given to call the office immediately for bloody urine, difficulty urinating, urinary retention, painful or frequent urination, fever, chills, nausea, vomiting or other illness. The patient stated that she understood these instructions and would comply with them.         Urinalysis w/Scope Micro  WBC/hpf: 0 -  5/hpf  RBC/hpf: 3 - 10/hpf  Bacteria: Few (10-25/hpf)  Cystals: NS (Not Seen)  Casts: NS (Not Seen)  Trichomonas: Not  Present  Mucous: Not Present  Epithelial Cells: 0 - 5/hpf  Yeast: NS (Not Seen)  Sperm: Not Present    ASSESSMENT:      ICD-10 Details  1 GU:   Microscopic hematuria - [id number] Acute, Uncomplicated  2   Bladder tumor/neoplasm - D41.4 Acute, Uncomplicated - Discussed the slightly abnormal findings noted on cystoscopy. Given as there is no change between the 1st and 2nd cystoscopy however tissue still remains lump be/almost nodular recommend proceeding with cystoscopy with bladder biopsy and fulguration. The risks and benefits of the procedure were discussed the patient including bleeding, infection, pain, need for Foley catheter, need for future intervention and treatment, damage to surrounding structures. Patient has agreed to proceed. She will be scheduled for surgery the date.      PLAN:           Document Letter(s):  Created for Patient: Clinical Summary         Notes:   cc: Cathlean Cower, MD

## 2020-03-01 NOTE — Anesthesia Preprocedure Evaluation (Signed)
Anesthesia Evaluation  Patient identified by MRN, date of birth, ID band Patient awake    Reviewed: Allergy & Precautions, NPO status , Patient's Chart, lab work & pertinent test results  History of Anesthesia Complications (+) PONV  Airway Mallampati: I  TM Distance: >3 FB Neck ROM: Full    Dental   Pulmonary former smoker,    Pulmonary exam normal        Cardiovascular hypertension, Pt. on medications Normal cardiovascular exam     Neuro/Psych Anxiety Depression    GI/Hepatic   Endo/Other    Renal/GU      Musculoskeletal   Abdominal   Peds  Hematology   Anesthesia Other Findings   Reproductive/Obstetrics                             Anesthesia Physical Anesthesia Plan  ASA: II  Anesthesia Plan: General   Post-op Pain Management:    Induction:   PONV Risk Score and Plan: 4 or greater and Ondansetron, Midazolam and Dexamethasone  Airway Management Planned: LMA  Additional Equipment:   Intra-op Plan:   Post-operative Plan: Extubation in OR  Informed Consent: I have reviewed the patients History and Physical, chart, labs and discussed the procedure including the risks, benefits and alternatives for the proposed anesthesia with the patient or authorized representative who has indicated his/her understanding and acceptance.       Plan Discussed with: CRNA and Surgeon  Anesthesia Plan Comments:         Anesthesia Quick Evaluation

## 2020-03-01 NOTE — Op Note (Signed)
PATIENT:  Barbara Gross  PRE-OPERATIVE DIAGNOSIS: Bladder lesion  POST-OPERATIVE DIAGNOSIS: Same  PROCEDURE:  Procedure(s): 1. Cystoscopy 2. Bladder biopsy with fulguration  SURGEON:  Jacalyn Lefevre, MD  ANESTHESIA:   General  EBL:  Minimal  DRAINS: none  SPECIMEN:   1.  Bladder biopsy trigone 2.  Bladder biopsy posterior wall  FINDINGS: 1. Normal urethra 2. Bilateral orthotopic UOs 3. Diffuse erythema of posterior wall 4. Nodular, irregular mucosa in mid trigone and proximal to bladder neck  DISPOSITION OF SPECIMEN:  PATHOLOGY  Indication: 64 year old woman found to have abnormal bladder mucosa on cystoscopy done for hematuria work-up.  Description of operation: The patient was taken to the operating room and administered general anesthesia. They were then placed on the table and moved to the dorsal lithotomy position after which the genitalia was sterilely prepped and draped. An official timeout was then performed.  The 99 French resectoscope with the 30 lens was passed into the bladder under direct visualization. Urethra appeared normal. The bladder was fully and systematically inspected. Ureteral orifices were noted to be in the normal anatomic positions.   There is abnormal bladder mucosa at the trigone as well as diffuse erythema of posterior bladder wall.  Previously seen erythema lateral to right UO was no longer present.  Cold cup bladder biopsy was taken of trigone and posterior wall.  Bugbee electrocautery was then used to fulgurate the area.  Hemostasis was adequate with irrigation turned off.   Reinspection of the bladder revealed no evidence of perforation.   The cystoscope was removed.   PLAN OF CARE: Discharge to home after PACU  PATIENT DISPOSITION:  PACU - hemodynamically stable.

## 2020-03-01 NOTE — Anesthesia Postprocedure Evaluation (Signed)
Anesthesia Post Note  Patient: KARY LEASON  Procedure(s) Performed: CYSTOSCOPY WITH BIOPSY, FULGERATION BLADDER (N/A Bladder)     Patient location during evaluation: PACU Anesthesia Type: General Level of consciousness: awake and alert Pain management: pain level controlled Vital Signs Assessment: post-procedure vital signs reviewed and stable Respiratory status: spontaneous breathing, nonlabored ventilation, respiratory function stable and patient connected to nasal cannula oxygen Cardiovascular status: blood pressure returned to baseline and stable Postop Assessment: no apparent nausea or vomiting Anesthetic complications: no    Last Vitals:  Vitals:   03/01/20 1215 03/01/20 1230  BP: 131/88 (!) 143/85  Pulse: 74 70  Resp: 12 13  Temp:    SpO2: 100% 96%    Last Pain:  Vitals:   03/01/20 1215  TempSrc:   PainSc: 0-No pain                 Chaysen Tillman DAVID

## 2020-03-03 ENCOUNTER — Other Ambulatory Visit: Payer: Self-pay | Admitting: Internal Medicine

## 2020-03-03 LAB — SURGICAL PATHOLOGY

## 2020-03-08 DIAGNOSIS — R3121 Asymptomatic microscopic hematuria: Secondary | ICD-10-CM | POA: Diagnosis not present

## 2020-03-13 ENCOUNTER — Other Ambulatory Visit: Payer: Self-pay | Admitting: Internal Medicine

## 2020-03-13 NOTE — Telephone Encounter (Signed)
Done erx  Please to contact pt - due for rov July 2021 for further refills

## 2020-04-05 DIAGNOSIS — Z1231 Encounter for screening mammogram for malignant neoplasm of breast: Secondary | ICD-10-CM | POA: Diagnosis not present

## 2020-04-22 ENCOUNTER — Other Ambulatory Visit (INDEPENDENT_AMBULATORY_CARE_PROVIDER_SITE_OTHER): Payer: BC Managed Care – PPO

## 2020-04-22 DIAGNOSIS — E611 Iron deficiency: Secondary | ICD-10-CM

## 2020-04-22 DIAGNOSIS — Z0000 Encounter for general adult medical examination without abnormal findings: Secondary | ICD-10-CM | POA: Diagnosis not present

## 2020-04-22 DIAGNOSIS — R739 Hyperglycemia, unspecified: Secondary | ICD-10-CM | POA: Diagnosis not present

## 2020-04-22 DIAGNOSIS — E559 Vitamin D deficiency, unspecified: Secondary | ICD-10-CM | POA: Diagnosis not present

## 2020-04-22 DIAGNOSIS — E538 Deficiency of other specified B group vitamins: Secondary | ICD-10-CM | POA: Diagnosis not present

## 2020-04-22 LAB — IBC PANEL
Iron: 117 ug/dL (ref 42–145)
Saturation Ratios: 33.7 % (ref 20.0–50.0)
Transferrin: 248 mg/dL (ref 212.0–360.0)

## 2020-04-22 LAB — HEMOGLOBIN A1C: Hgb A1c MFr Bld: 5.7 % (ref 4.6–6.5)

## 2020-04-22 LAB — LIPID PANEL
Cholesterol: 168 mg/dL (ref 0–200)
HDL: 73.9 mg/dL (ref >39.00)
LDL Cholesterol: 75 mg/dL (ref 0–99)
NonHDL: 93.89
Total CHOL/HDL Ratio: 2
Triglycerides: 92 mg/dL (ref 0.0–149.0)
VLDL: 18.4 mg/dL (ref 0.0–40.0)

## 2020-04-22 LAB — CBC WITH DIFFERENTIAL/PLATELET
Basophils Absolute: 0 10*3/uL (ref 0.0–0.1)
Basophils Relative: 0.5 % (ref 0.0–3.0)
Eosinophils Absolute: 0.2 10*3/uL (ref 0.0–0.7)
Eosinophils Relative: 2.8 % (ref 0.0–5.0)
HCT: 43.3 % (ref 36.0–46.0)
Hemoglobin: 14.7 g/dL (ref 12.0–15.0)
Lymphocytes Relative: 35.1 % (ref 12.0–46.0)
Lymphs Abs: 2.7 10*3/uL (ref 0.7–4.0)
MCHC: 33.9 g/dL (ref 30.0–36.0)
MCV: 100.3 fl — ABNORMAL HIGH (ref 78.0–100.0)
Monocytes Absolute: 0.8 10*3/uL (ref 0.1–1.0)
Monocytes Relative: 9.9 % (ref 3.0–12.0)
Neutro Abs: 4 10*3/uL (ref 1.4–7.7)
Neutrophils Relative %: 51.7 % (ref 43.0–77.0)
Platelets: 308 10*3/uL (ref 150.0–400.0)
RBC: 4.31 Mil/uL (ref 3.87–5.11)
RDW: 13.5 % (ref 11.5–15.5)
WBC: 7.6 10*3/uL (ref 4.0–10.5)

## 2020-04-22 LAB — URINALYSIS, ROUTINE W REFLEX MICROSCOPIC
Bilirubin Urine: NEGATIVE
Ketones, ur: NEGATIVE
Leukocytes,Ua: NEGATIVE
Nitrite: NEGATIVE
Specific Gravity, Urine: 1.025 (ref 1.000–1.030)
Total Protein, Urine: NEGATIVE
Urine Glucose: NEGATIVE
Urobilinogen, UA: 0.2 (ref 0.0–1.0)
pH: 6 (ref 5.0–8.0)

## 2020-04-22 LAB — BASIC METABOLIC PANEL
BUN: 14 mg/dL (ref 6–23)
CO2: 28 mEq/L (ref 19–32)
Calcium: 10 mg/dL (ref 8.4–10.5)
Chloride: 103 mEq/L (ref 96–112)
Creatinine, Ser: 0.65 mg/dL (ref 0.40–1.20)
GFR: 91.83 mL/min (ref >60.00)
Glucose, Bld: 109 mg/dL — ABNORMAL HIGH (ref 70–99)
Potassium: 4.4 mEq/L (ref 3.5–5.1)
Sodium: 139 mEq/L (ref 135–145)

## 2020-04-22 LAB — HEPATIC FUNCTION PANEL
ALT: 26 U/L (ref 0–35)
AST: 23 U/L (ref 0–37)
Albumin: 4.6 g/dL (ref 3.5–5.2)
Alkaline Phosphatase: 63 U/L (ref 39–117)
Bilirubin, Direct: 0.1 mg/dL (ref 0.0–0.3)
Total Bilirubin: 0.5 mg/dL (ref 0.2–1.2)
Total Protein: 7.5 g/dL (ref 6.0–8.3)

## 2020-04-22 LAB — TSH: TSH: 1.2 u[IU]/mL (ref 0.35–4.50)

## 2020-04-22 LAB — VITAMIN D 25 HYDROXY (VIT D DEFICIENCY, FRACTURES): VITD: 51.42 ng/mL (ref 30.00–100.00)

## 2020-04-22 LAB — VITAMIN B12: Vitamin B-12: 591 pg/mL (ref 211–911)

## 2020-04-28 ENCOUNTER — Ambulatory Visit (INDEPENDENT_AMBULATORY_CARE_PROVIDER_SITE_OTHER): Payer: BC Managed Care – PPO | Admitting: Internal Medicine

## 2020-04-28 ENCOUNTER — Other Ambulatory Visit: Payer: Self-pay

## 2020-04-28 ENCOUNTER — Encounter: Payer: Self-pay | Admitting: Internal Medicine

## 2020-04-28 VITALS — BP 140/90 | HR 83 | Temp 98.1°F | Ht 65.0 in | Wt 159.0 lb

## 2020-04-28 DIAGNOSIS — Z0001 Encounter for general adult medical examination with abnormal findings: Secondary | ICD-10-CM

## 2020-04-28 DIAGNOSIS — R197 Diarrhea, unspecified: Secondary | ICD-10-CM | POA: Insufficient documentation

## 2020-04-28 DIAGNOSIS — R739 Hyperglycemia, unspecified: Secondary | ICD-10-CM

## 2020-04-28 DIAGNOSIS — Z86010 Personal history of colonic polyps: Secondary | ICD-10-CM | POA: Diagnosis not present

## 2020-04-28 DIAGNOSIS — Z0000 Encounter for general adult medical examination without abnormal findings: Secondary | ICD-10-CM | POA: Diagnosis not present

## 2020-04-28 DIAGNOSIS — I10 Essential (primary) hypertension: Secondary | ICD-10-CM

## 2020-04-28 DIAGNOSIS — M25562 Pain in left knee: Secondary | ICD-10-CM | POA: Diagnosis not present

## 2020-04-28 DIAGNOSIS — E785 Hyperlipidemia, unspecified: Secondary | ICD-10-CM

## 2020-04-28 MED ORDER — CITALOPRAM HYDROBROMIDE 10 MG PO TABS: 10.0000 mg | ORAL_TABLET | Freq: Every day | ORAL | 3 refills | Status: DC

## 2020-04-28 MED ORDER — MELOXICAM 15 MG PO TABS: ORAL_TABLET | ORAL | 2 refills | Status: DC

## 2020-04-28 MED ORDER — LOSARTAN POTASSIUM 50 MG PO TABS: 50.0000 mg | ORAL_TABLET | Freq: Every day | ORAL | 3 refills | Status: DC

## 2020-04-28 MED ORDER — LOVASTATIN 20 MG PO TABS: 20.0000 mg | ORAL_TABLET | Freq: Every day | ORAL | 3 refills | Status: DC

## 2020-04-28 MED ORDER — AMLODIPINE BESYLATE 5 MG PO TABS: 5.0000 mg | ORAL_TABLET | Freq: Every day | ORAL | 3 refills | Status: DC

## 2020-04-28 NOTE — Progress Notes (Signed)
Subjective:    Patient ID: Barbara Gross, female    DOB: 1956-06-29, 64 y.o.   MRN: [id number]  HPI  Here for wellness and f/u;  Overall doing ok;  Pt denies Chest pain, worsening SOB, DOE, wheezing, orthopnea, PND, worsening LE edema, palpitations, dizziness or syncope.  Pt denies neurological change such as new headache, facial or extremity weakness.  Pt denies polydipsia, polyuria, or low sugar symptoms. Pt states overall good compliance with treatment and medications, good tolerability, and has been trying to follow appropriate diet.  Pt denies worsening depressive symptoms, suicidal ideation or panic. No fever, night sweats, wt loss, loss of appetite, or other constitutional symptoms.  Pt states good ability with ADL's, has low fall risk, home safety reviewed and adequate, no other significant changes in hearing or vision, and only occasionally active with exercise. Wt Readings from Last 3 Encounters:  04/28/20 159 lb (72.1 kg)  03/01/20 157 lb (71.2 kg)  06/16/19 152 lb (68.9 kg)  Also having 1 mo osnet daily diarrheal watery, no fever, does have some crampy pains, no n/v, appetite ok, wt has actually increased somewhat lately, no blood. 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  Denies worsening reflux,  Dysphagia or constipation.  Last colonoscopy 2017.   Gabapentin not working, and usually forgets to take anyway Past Medical History:  Diagnosis Date  . ABDOMINAL PAIN, LOWER 05/22/2010  . Allergy    pollen   . ANXIETY 12/24/2007  . Arthritis    back , shoulder  . BACTERIAL VAGINITIS 12/24/2007  . BREAST MASS, LEFT 07/15/2008  . DEPRESSION 12/24/2007  . DIVERTICULITIS OF COLON 04/21/2010  . History of migraine    none in years  . HYPERLIPIDEMIA 05/31/2007  . HYPERTENSION 05/31/2007  . Hypertension   . Lesion of bladder   . MENOPAUSE, EARLY 01/04/2010  . Microscopic hematuria 01/03/2009  . PONV (postoperative nausea and  vomiting)    Past Surgical History:  Procedure Laterality Date  . ABDOMINAL HYSTERECTOMY  1990  . COLONOSCOPY  2018  . CYSTOSCOPY WITH BIOPSY N/A 03/01/2020   Procedure: CYSTOSCOPY WITH BIOPSY, FULGERATION BLADDER;  Surgeon: Robley Fries, MD;  Location: [facility];  Service: Urology;  Laterality: N/A;  . EXPLORATORY LAPAROTOMY  as teenager  . OOPHORECTOMY Bilateral 1990  . POLYPECTOMY  2018  . TONSILLECTOMY  as child    reports that she has quit smoking. Her smoking use included cigarettes. She has a 15.00 pack-year smoking history. She has never used smokeless tobacco. She reports current alcohol use of about 14.0 standard drinks of alcohol per week. She reports that she does not use drugs. family history includes Cancer in an other family member; Colon cancer in her paternal grandmother and another family member; Colon polyps in her father; Diabetes in her father; Hyperlipidemia in her father and mother; Hypertension in her father and mother; Melanoma in an other family member; Stroke in an other family member; Sudden death in her mother; Ulcers in her mother. Allergies  Allergen Reactions  . Codeine Nausea And Vomiting  . Hydrocodone Nausea And Vomiting  . Venlafaxine Nausea Only    effexor   Current Outpatient Medications on File Prior to Visit  Medication Sig Dispense Refill  . aspirin 81 MG EC tablet Take 81 mg by mouth daily.       No current facility-administered medications on file prior to visit.   Review of  Systems All otherwise neg per pt     Objective:   Physical Exam BP (!) 140/90 (BP Location: Left Arm, Patient Position: Sitting, Cuff Size: Large)   Pulse 83   Temp 98.1 F (36.7 C) (Oral)   Ht 5\' 5"  (1.651 m)   Wt 159 lb (72.1 kg)   SpO2 97%   BMI 26.46 kg/m  VS noted,  Constitutional: Pt appears in NAD HENT: Head: NCAT.  Right Ear: External ear normal.  Left Ear: External ear normal.  Eyes: . Pupils are equal, round, and reactive to  light. Conjunctivae and EOM are normal Nose: without d/c or deformity Neck: Neck supple. Gross normal ROM Cardiovascular: Normal rate and regular rhythm.   Pulmonary/Chest: Effort normal and breath sounds without rales or wheezing.  Abd:  Soft, NT, ND, + BS, no organomegaly Neurological: Pt is alert. At baseline orientation, motor grossly intact Skin: Skin is warm. No rashes, other new lesions, no LE edema Psychiatric: Pt behavior is normal without agitation  All otherwise neg per pt Lab Results  Component Value Date   WBC 7.6 04/22/2020   HGB 14.7 04/22/2020   HCT 43.3 04/22/2020   PLT 308.0 04/22/2020   GLUCOSE 109 (H) 04/22/2020   CHOL 168 04/22/2020   TRIG 92.0 04/22/2020   HDL 73.90 04/22/2020   LDLCALC 75 04/22/2020   ALT 26 04/22/2020   AST 23 04/22/2020   NA 139 04/22/2020   K 4.4 04/22/2020   CL 103 04/22/2020   CREATININE 0.65 04/22/2020   BUN 14 04/22/2020   CO2 28 04/22/2020   TSH 1.20 04/22/2020   HGBA1C 5.7 04/22/2020       Assessment & Plan:

## 2020-04-28 NOTE — Patient Instructions (Signed)
Please continue all other medications as before, and refills have been done if requested.  Please have the pharmacy call with any other refills you may need.  Please continue your efforts at being more active, low cholesterol diet, and weight control.  You are otherwise up to date with prevention measures today.  Please keep your appointments with your specialists as you may have planned  Please go to the LAB at the blood drawing area for the tests to be done - today for the diarrhea testong  You will be contacted regarding the referral for: Gastroenterology for the polyps and diarrhea  You will be contacted by phone if any changes need to be made immediately.  Otherwise, you will receive a letter about your results with an explanation, but please check with MyChart first.  Please remember to sign up for MyChart if you have not done so, as this will be important to you in the future with finding out test results, communicating by private email, and scheduling acute appointments online when needed.  Please make an Appointment to return for your 1 year visit, or sooner if needed

## 2020-04-28 NOTE — Assessment & Plan Note (Signed)
C/w baker cyst, cont f/u with sports medicin

## 2020-05-01 ENCOUNTER — Encounter: Payer: Self-pay | Admitting: Internal Medicine

## 2020-05-01 NOTE — Assessment & Plan Note (Signed)
stable overall by history and exam, recent data reviewed with pt, and pt to continue medical treatment as before,  to f/u any worsening symptoms or concerns  

## 2020-05-01 NOTE — Assessment & Plan Note (Addendum)
Etiology unclear, for studies as orderd, immodium prn, and refer gi as has hx of polyps as well  I spent 31 minutes in addition to time for CPX wellness examination in preparing to see the patient by review of recent labs, imaging and procedures, obtaining and reviewing separately obtained history, communicating with the patient and family or caregiver, ordering medications, tests or procedures, and documenting clinical information in the EHR including the differential Dx, treatment, and any further evaluation and other management of diarrhea, left knee pain, htn, hld, hyperglycemia

## 2020-05-01 NOTE — Assessment & Plan Note (Signed)

## 2020-05-05 LAB — STOOL CULTURE

## 2020-05-05 LAB — GLIADIN ANTIBODIES, SERUM
Gliadin IgA: 6 Units
Gliadin IgG: 2 Units

## 2020-05-05 LAB — TISSUE TRANSGLUTAMINASE, IGA: (tTG) Ab, IgA: 1 U/mL

## 2020-05-05 LAB — RETICULIN ANTIBODIES, IGA W TITER: Reticulin IGA Screen: NEGATIVE

## 2020-05-06 ENCOUNTER — Encounter: Payer: Self-pay | Admitting: Internal Medicine

## 2020-05-12 ENCOUNTER — Other Ambulatory Visit: Payer: Self-pay | Admitting: Family Medicine

## 2020-05-13 ENCOUNTER — Other Ambulatory Visit: Payer: BC Managed Care – PPO

## 2020-05-13 DIAGNOSIS — R197 Diarrhea, unspecified: Secondary | ICD-10-CM

## 2020-05-16 ENCOUNTER — Encounter: Payer: Self-pay | Admitting: Internal Medicine

## 2020-05-17 ENCOUNTER — Encounter: Payer: Self-pay | Admitting: Internal Medicine

## 2020-05-17 LAB — GI PROFILE, STOOL, PCR

## 2020-05-17 LAB — STOOL CULTURE
MICRO NUMBER:: 10824643
MICRO NUMBER:: 10824644
SHIGA RESULT:: NOT DETECTED
SPECIMEN QUALITY:: ADEQUATE
SPECIMEN QUALITY:: ADEQUATE

## 2020-05-25 LAB — CLOSTRIDIUM DIFFICILE BY PCR

## 2020-05-25 LAB — SPECIMEN STATUS REPORT

## 2020-06-07 ENCOUNTER — Encounter: Payer: Self-pay | Admitting: Nurse Practitioner

## 2020-06-07 ENCOUNTER — Ambulatory Visit: Payer: BC Managed Care – PPO | Admitting: Nurse Practitioner

## 2020-06-07 VITALS — BP 140/90 | HR 88 | Ht 62.0 in | Wt 158.5 lb

## 2020-06-07 DIAGNOSIS — R197 Diarrhea, unspecified: Secondary | ICD-10-CM

## 2020-06-07 DIAGNOSIS — K635 Polyp of colon: Secondary | ICD-10-CM | POA: Diagnosis not present

## 2020-06-07 NOTE — Progress Notes (Signed)
06/07/2020 Barbara Gross [id number] [birth year]   Chief Complaint:  Diarrhea  History of Present Illness: Barbara Gross is a 64 year old female with a past medical history of anxiety, depression, hypertension, hyperlipidemia, diverticulitis 2011 and colon polyps. She presents today as referred by Dr. Cathlean Cower for further evaluation regarding diarrhea. She complains of having diarrhea for the past 4 months. No specific food or stress triggers. She describes having 1 to 3 episodes of muddy diarrhea daily. No bloody diarrhea. No melena. Infrequent lower abdominal cramping. She drinks less than 4 oz of mild daily. She eats cheese most days and yogurt. No antibiotics or new medications over the past 6 months. She is having increased anxiety during the Covid pandemic. She  reports feeling isolated as she works from home and lives alone. She is on Celexa 10mg  once daily for anxiety, mild depression as well. She takes Metamucil daily. Labs 03/2020: TSH 1.20 and celiac panel negative. Normal CBC and CMP.  She underwent a colonoscopy by Dr. Hilarie Fredrickson on 06/22/2016 which identified 2 tubular adenomatous polyps which were removed from the hepatic flexure and sigmoid colon.  Diverticulosis throughout the colon was noted. No weight loss. Her mother died from a lower GI bleed, further details unclear.   CBC Latest Ref Rng & Units 04/22/2020 03/01/2020 04/22/2019  WBC 4.0 - 10.5 K/uL 7.6 - 7.2  Hemoglobin 12.0 - 15.0 g/dL 14.7 16.0(H) 14.3  Hematocrit 36 - 46 % 43.3 47.0(H) 42.3  Platelets 150 - 400 K/uL 308.0 - 360.0    CMP Latest Ref Rng & Units 04/22/2020 03/01/2020 04/22/2019  Glucose 70 - 99 mg/dL 109(H) 114(H) 95  BUN 6 - 23 mg/dL 14 14 19   Creatinine 0.40 - 1.20 mg/dL 0.65 0.50 0.64  Sodium 135 - 145 mEq/L 139 142 139  Potassium 3.5 - 5.1 mEq/L 4.4 4.0 4.4  Chloride 96 - 112 mEq/L 103 104 104  CO2 19 - 32 mEq/L 28 - 27  Calcium 8.4 - 10.5 mg/dL 10.0 - 9.8  Total Protein 6.0 - 8.3 g/dL 7.5 - 7.3    Total Bilirubin 0.2 - 1.2 mg/dL 0.5 - 0.4  Alkaline Phos 39 - 117 U/L 63 - 55  AST 0 - 37 U/L 23 - 21  ALT 0 - 35 U/L 26 - 23    Colonoscopy 06/22/2016 by Dr. Hilarie Fredrickson: - One 4 mm tubular adenomatous polyp at the hepatic flexure, removed with a cold snare. Resected and retrieved. - One 5 mm hyperplastic polyp in the sigmoid colon, removed with a cold snare. Resected and retrieved. - Moderate to servere diverticulosis in the entire examined colon. Erythema was seen in association with the diverticular opening in sigmoid and rectosigmoid colon. - Internal hemorrhoids. - 5 year recall  Colonoscopy 04/20/2013: 2 sessile polyps measuring 2-4 mm in size were found at the cecum and the ascending colon. Sessile polyp measuring 8 mm in size was found in the ascending colon. 3 sessile polyps measuring 5 mm in size were found in the sigmoid colon and rectosigmoid colon. Moderate diverticulosis noted in the ascending colon, descending colon and sigmoid colon. 1. Surgical [P], cecum, ascending colon polyp, bx - TUBULAR ADENOMA. - SESSILE SERRATED ADENOMA(S). - NEGATIVE FOR HIGH GRADE DYSPLASIA. 2. Surgical [P], sigmoid, recto-sigmoid colon, bx - HYPERPLASTIC POLYP.  Current Outpatient Medications on File Prior to Visit  Medication Sig Dispense Refill  . amLODipine (NORVASC) 5 MG tablet Take 1 tablet (5 mg total) by mouth daily. 90 tablet  3  . aspirin 81 MG EC tablet Take 81 mg by mouth daily.      . citalopram (CELEXA) 10 MG tablet Take 1 tablet (10 mg total) by mouth daily. 90 tablet 3  . gabapentin (NEURONTIN) 400 MG capsule TAKE ONE CAPSULE BY MOUTH EVERY NIGHT AT BEDTIME 40 capsule 0  . losartan (COZAAR) 50 MG tablet Take 1 tablet (50 mg total) by mouth daily. 90 tablet 3  . lovastatin (MEVACOR) 20 MG tablet Take 1 tablet (20 mg total) by mouth at bedtime. 90 tablet 3   No current facility-administered medications on file prior to visit.   Allergies  Allergen Reactions  . Codeine Nausea  And Vomiting  . Effexor [Venlafaxine] Nausea Only  . Hydrocodone Nausea And Vomiting      Current Medications, Allergies, Past Medical History, Past Surgical History, Family History and Social History were reviewed in [employer] record.   Physical Exam: BP 140/90 (BP Location: Left Arm, Patient Position: Sitting, Cuff Size: Normal)   Pulse 88   Ht 5\' 2"  (1.575 m) Comment: height measured without shoes  Wt 158 lb 8 oz (71.9 kg)   BMI 28.99 kg/m   General: Well developed 64 year old female in no acute distress. Head: Normocephalic and atraumatic. Eyes: No scleral icterus. Conjunctiva pink . Ears: Normal auditory acuity. Mouth: Dentition intact. No ulcers or lesions.  Lungs: Clear throughout to auscultation. Heart: Regular rate and rhythm, no murmur. Abdomen: Soft, nontender and nondistended. No masses or hepatomegaly. Normal bowel sounds x 4 quadrants.  Rectal: Deferred.  Musculoskeletal: Symmetrical with no gross deformities. Extremities: No edema. Neurological: Alert oriented x 4. No focal deficits.  Psychological: Alert and cooperative. Normal mood and affect  Assessment and Recommendations:  17.  63 year old female with diarrhea. No abdominal pain.  -GI pathogen panel -Use Lactaid with each dairy product -Follow-up with Dr. Jenny Reichmann to consider increasing Celexa to 15 mg daily for anxiety which may also reduce IBS symptoms -Consider a diagnostic colonoscopy if diarrhea persists or worsens -Follow-up in the office in 6 weeks  2.  History of colon polyps -Next colonoscopy due Sept. 2022

## 2020-06-07 NOTE — Patient Instructions (Addendum)
If you are age 64 or older, your body mass index should be between 23-30. Your Body mass index is 28.99 kg/m. If this is out of the aforementioned range listed, please consider follow up with your Primary Care Provider.  If you are age 11 or younger, your body mass index should be between 19-25. Your Body mass index is 28.99 kg/m. If this is out of the aformentioned range listed, please consider follow up with your Primary Care Provider.   1. Use lactaid with each dairy product 2. Follow up with Dr Wille Glaser consider increasing Celexa to 15mg  daily 3.call the office if your symptoms worsen 4. Follow up in 6 weeks 07/13/2020 at 2:30pm  Due to recent changes in healthcare laws, you may see the results of your imaging and laboratory studies on MyChart before your provider has had a chance to review them.  We understand that in some cases there may be results that are confusing or concerning to you. Not all laboratory results come back in the same time frame and the provider may be waiting for multiple results in order to interpret others.  Please give [place] 48 hours in order for your provider to thoroughly review all the results before contacting the office for clarification of your results.   Thank you for choosing [facility] Noralyn Pick, CRNP

## 2020-06-08 ENCOUNTER — Encounter: Payer: Self-pay | Admitting: Internal Medicine

## 2020-06-08 MED ORDER — CITALOPRAM HYDROBROMIDE 10 MG PO TABS: 15.0000 mg | ORAL_TABLET | Freq: Every day | ORAL | 3 refills | Status: DC

## 2020-06-08 NOTE — Progress Notes (Signed)
Addendum: Reviewed and agree with assessment and management plan. Linet Brash M, MD  

## 2020-06-11 ENCOUNTER — Other Ambulatory Visit: Payer: Self-pay | Admitting: Internal Medicine

## 2020-07-13 ENCOUNTER — Ambulatory Visit: Payer: BC Managed Care – PPO | Admitting: Nurse Practitioner

## 2020-07-13 ENCOUNTER — Encounter: Payer: Self-pay | Admitting: Nurse Practitioner

## 2020-07-13 VITALS — BP 130/84 | HR 87 | Ht 62.0 in | Wt 159.0 lb

## 2020-07-13 DIAGNOSIS — E739 Lactose intolerance, unspecified: Secondary | ICD-10-CM | POA: Diagnosis not present

## 2020-07-13 DIAGNOSIS — R197 Diarrhea, unspecified: Secondary | ICD-10-CM | POA: Diagnosis not present

## 2020-07-13 NOTE — Patient Instructions (Addendum)
If you are age 64 or older, your body mass index should be between 23-30. Your Body mass index is 29.08 kg/m. If this is out of the aforementioned range listed, please consider follow up with your Primary Care Provider.  If you are age 26 or younger, your body mass index should be between 19-25. Your Body mass index is 29.08 kg/m. If this is out of the aformentioned range listed, please consider follow up with your Primary Care Provider.   1. Continue Lactaid 1 to 2 tabs with each diary product. Avoid excessive dairy intake.  2.  Call our office if your diarrhea recurs   3. Next colonoscopy is due Setp. 2022

## 2020-07-13 NOTE — Progress Notes (Signed)
07/13/2020 Barbara FREIBERGER [id number] [birth year]   Chief Complaint: Diarrhea follow up  History of Present Illness: Barbara Gross is a 64 year old female with a past medical history of anxiety, depression, hypertension, hyperlipidemia, diverticulitis 2011 and colon polyps. I saw her in the office on 06/07/2020 with complaints of diarrhea x 4 months. A GI pathogen panel 05/13/2020 was negative. She was advised to use Lactaid. We discussed scheduling a diagnostic colonoscopy if her diarrhea persisted. She presents today for further follow up. She stated her diarrhea completely resolved after she started taking Lactaid with each dairy product. She typically eats cheese daily. She is passing a normal solid stool once to twice daily. No rectal bleeding. No abdominal pain. She is having some arthritis back pain for which she intends to follow up with her PCP.   She underwent a colonoscopy by Dr. Hilarie Fredrickson on 06/22/2016 which identified 2 tubular adenomatous polyps which were removed from the hepatic flexure and sigmoid colon. Diverticulosis throughout the colon was noted. Recall colonoscopy advised in 5 years. Her mother died from a lower GI bleed, further details unclear.    Current Outpatient Medications on File Prior to Visit  Medication Sig Dispense Refill  . amLODipine (NORVASC) 5 MG tablet Take 1 tablet (5 mg total) by mouth daily. 90 tablet 3  . aspirin 81 MG EC tablet Take 81 mg by mouth daily.      . citalopram (CELEXA) 10 MG tablet Take 1.5 tablets (15 mg total) by mouth daily. 135 tablet 3  . gabapentin (NEURONTIN) 100 MG capsule TAKE ONE CAPSULE BY MOUTH TWICE A DAY BURING DAYTIME HOURS  MUST MAKE APPOINTMENT 180 capsule 1  . gabapentin (NEURONTIN) 400 MG capsule TAKE ONE CAPSULE BY MOUTH EVERY NIGHT AT BEDTIME 40 capsule 0  . losartan (COZAAR) 50 MG tablet Take 1 tablet (50 mg total) by mouth daily. 90 tablet 3  . lovastatin (MEVACOR) 20 MG tablet Take 1 tablet (20 mg total) by  mouth at bedtime. 90 tablet 3   No current facility-administered medications on file prior to visit.    Allergies  Allergen Reactions  . Codeine Nausea And Vomiting  . Effexor [Venlafaxine] Nausea Only  . Hydrocodone Nausea And Vomiting    Current Medications, Allergies, Past Medical History, Past Surgical History, Family History and Social History were reviewed in [employer] record.   Review of Systems:   Constitutional: Negative for fever, sweats, chills or weight loss.  Respiratory: Negative for shortness of breath.   Cardiovascular: Negative for chest pain, palpitations and leg swelling.  Gastrointestinal: See HPI.  Musculoskeletal: + back pain.  Neurological: Negative for dizziness, headaches or paresthesias.    Physical Exam: BP 130/84   Pulse 87   Ht 5\' 2"  (1.575 m)   Wt 159 lb (72.1 kg)   BMI 29.08 kg/m   General: Well developed 64 year old female in no acute distress. Head: Normocephalic and atraumatic. Eyes: No scleral icterus. Conjunctiva pink .  Lungs: Clear throughout to auscultation. Heart: Regular rate and rhythm, no murmur. Abdomen: Soft, nontender and nondistended. No masses or hepatomegaly. Normal bowel sounds x 4 quadrants.  Rectal: Deferred.  Musculoskeletal: Symmetrical with no gross deformities. Extremities: No edema. Neurological: Alert oriented x 4. No focal deficits.  Psychological: Alert and cooperative. Normal mood and affect  Assessment and Recommendations: 1. Diarrhea most likely due to lactose intolerance -Continue Lactaid 1 to 2 tabs per dairy product  -Patient to call our office  if her diarrhea recurs   2. History of tubular adenomatous colon polyps -Next colonoscopy due Sept. 2022

## 2020-07-25 ENCOUNTER — Encounter: Payer: Self-pay | Admitting: Internal Medicine

## 2020-07-25 DIAGNOSIS — M549 Dorsalgia, unspecified: Secondary | ICD-10-CM

## 2020-07-26 NOTE — Progress Notes (Signed)
Addendum: Reviewed and agree with assessment and management plan. Jackson Coffield M, MD  

## 2020-07-29 ENCOUNTER — Other Ambulatory Visit: Payer: Self-pay

## 2020-07-29 ENCOUNTER — Encounter: Payer: Self-pay | Admitting: Family Medicine

## 2020-07-29 ENCOUNTER — Ambulatory Visit (INDEPENDENT_AMBULATORY_CARE_PROVIDER_SITE_OTHER): Payer: BC Managed Care – PPO

## 2020-07-29 ENCOUNTER — Ambulatory Visit: Payer: BC Managed Care – PPO | Admitting: Family Medicine

## 2020-07-29 VITALS — BP 124/92 | Ht 62.0 in | Wt 158.0 lb

## 2020-07-29 DIAGNOSIS — G8929 Other chronic pain: Secondary | ICD-10-CM | POA: Diagnosis not present

## 2020-07-29 DIAGNOSIS — M545 Low back pain, unspecified: Secondary | ICD-10-CM | POA: Diagnosis not present

## 2020-07-29 MED ORDER — TIZANIDINE HCL 2 MG PO TABS: 2.0000 mg | ORAL_TABLET | Freq: Every day | ORAL | 0 refills | Status: DC

## 2020-07-29 MED ORDER — PREDNISONE 20 MG PO TABS: 40.0000 mg | ORAL_TABLET | Freq: Every day | ORAL | 0 refills | Status: DC

## 2020-07-29 MED ORDER — GABAPENTIN 300 MG PO CAPS: 300.0000 mg | ORAL_CAPSULE | Freq: Every day | ORAL | 0 refills | Status: DC

## 2020-07-29 NOTE — Progress Notes (Signed)
[facility] [place] Phone: [phone] Subjective:   [name], am serving as a scribe for Dr. Hulan Saas. This visit occurred during the SARS-CoV-2 public health emergency.  Safety protocols were in place, including screening questions prior to the visit, additional usage of staff PPE, and extensive cleaning of exam room while observing appropriate contact time as indicated for disinfecting solutions.   I'm seeing this patient by the request  of:  Biagio Borg, MD  CC: Low back pain  YHC:[id number]  Barbara Gross is a 64 y.o. female coming in with complaint of low back pain. Last seen in September 2020 for knee pain. Patient states that she is having lower right sided back pain.  Been going on for approximately 5 days.  Denies any radiating symptoms. Pain can be sharp at times. Has been working from home and realizes that sitting at [employer] table chair. Is getting a new chair from work. Rx of gabapentin just ran out. Was taking 400mg  daily.    Patient's last x-rays were in 2017.  Independently visualized by me showing the patient did have degenerative disc disease at L2-L3 with some mild narrowing and degenerative facet arthritic changes of the lumbar spine.    Past Medical History:  Diagnosis Date  . ABDOMINAL PAIN, LOWER 05/22/2010  . Allergy    pollen   . ANXIETY 12/24/2007  . Arthritis    back , shoulder  . BACTERIAL VAGINITIS 12/24/2007  . BREAST MASS, LEFT 07/15/2008  . DEPRESSION 12/24/2007  . DIVERTICULITIS OF COLON 04/21/2010  . History of migraine    none in years  . HYPERLIPIDEMIA 05/31/2007  . HYPERTENSION 05/31/2007  . Hypertension   . Lesion of bladder   . MENOPAUSE, EARLY 01/04/2010  . Microscopic hematuria 01/03/2009  . PONV (postoperative nausea and vomiting)    Past Surgical History:  Procedure Laterality Date  . ABDOMINAL HYSTERECTOMY  1990  . COLONOSCOPY  2018  . CYSTOSCOPY WITH BIOPSY  N/A 03/01/2020   Procedure: CYSTOSCOPY WITH BIOPSY, FULGERATION BLADDER;  Surgeon: Robley Fries, MD;  Location: [facility];  Service: Urology;  Laterality: N/A;  . EXPLORATORY LAPAROTOMY  as teenager  . OOPHORECTOMY Bilateral 1990  . POLYPECTOMY  2018  . TONSILLECTOMY  as child   Social History   Socioeconomic History  . Marital status: Single    Spouse name: Not on file  . Number of children: Not on file  . Years of education: Not on file  . Highest education level: Not on file  Occupational History  . 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 Smoking status: Former Smoker    Packs/day: 1.00    Years: 15.00    Pack years: 15.00    Types: Cigarettes  . Smokeless tobacco: Never Used  . Tobacco comment: quit in the 80's   Vaping Use  . Vaping Use: Never used  Substance and Sexual Activity  . Alcohol use: Yes    Alcohol/week: 14.0 standard drinks    Types: 14 Glasses of wine per week    Comment: 2 glasses of wine a night  . Drug use: No  . Sexual activity: Not on file  Other Topics Concern  . Not on file  Social History Narrative  . Not on file   Social Determinants of Health   Financial Resource Strain:   . Difficulty of Paying Living Expenses: Not on file  Food  Insecurity:   . Worried About Charity fundraiser in the Last Year: Not on file  . Ran Out of Food in the Last Year: Not on file  Transportation Needs:   . Lack of Transportation (Medical): Not on file  . Lack of Transportation (Non-Medical): Not on file  Physical Activity:   . Days of Exercise per Week: Not on file  . Minutes of Exercise per Session: Not on file  Stress:   . Feeling of Stress : Not on file  Social Connections:   . Frequency of Communication with Friends and Family: Not on file  . Frequency of Social Gatherings with Friends and Family: Not on file  . Attends Religious Services: Not on file  . Active Member of Clubs or Organizations: Not on file    . Attends Archivist Meetings: Not on file  . Marital Status: Not on file   Allergies  Allergen Reactions  . Codeine Nausea And Vomiting  . Effexor [Venlafaxine] Nausea Only  . Hydrocodone Nausea And Vomiting   Family History  Problem Relation Age of Onset  . Hypertension Mother   . Ulcers Mother        bleeding  . Hyperlipidemia Mother   . Sudden death Mother        bleeding ulcer 1995  . Diabetes Father   . Hypertension Father   . Hyperlipidemia Father   . Colon polyps Father   . Cancer Other        prostate  . Melanoma Other   . Colon cancer Other        dx'd in 59's  . Stroke Other   . Colon cancer Paternal Grandmother   . Esophageal cancer Neg Hx   . Rectal cancer Neg Hx   . Stomach cancer Neg Hx     Current Outpatient Medications (Endocrine & Metabolic):  .  predniSONE (DELTASONE) 20 MG tablet, Take 2 tablets (40 mg total) by mouth daily with breakfast.  Current Outpatient Medications (Cardiovascular):  .  amLODipine (NORVASC) 5 MG tablet, Take 1 tablet (5 mg total) by mouth daily. Marland Kitchen  losartan (COZAAR) 50 MG tablet, Take 1 tablet (50 mg total) by mouth daily. Marland Kitchen  lovastatin (MEVACOR) 20 MG tablet, Take 1 tablet (20 mg total) by mouth at bedtime.   Current Outpatient Medications (Analgesics):  .  aspirin 81 MG EC tablet, Take 81 mg by mouth daily.     Current Outpatient Medications (Other):  .  citalopram (CELEXA) 10 MG tablet, Take 1.5 tablets (15 mg total) by mouth daily. Marland Kitchen  gabapentin (NEURONTIN) 100 MG capsule, TAKE ONE CAPSULE BY MOUTH TWICE A DAY BURING DAYTIME HOURS  MUST MAKE APPOINTMENT .  gabapentin (NEURONTIN) 300 MG capsule, Take 1 capsule (300 mg total) by mouth at bedtime. Marland Kitchen  tiZANidine (ZANAFLEX) 2 MG tablet, Take 1 tablet (2 mg total) by mouth at bedtime.   Reviewed prior external information including notes and imaging from  primary care provider As well as notes that were available from care everywhere and other healthcare  systems.  Past medical history, social, surgical and family history all reviewed in electronic medical record.  No pertanent information unless stated regarding to the chief complaint.   Review of Systems:  No headache, visual changes, nausea, vomiting, diarrhea, constipation, dizziness, abdominal pain, skin rash, fevers, chills, night sweats, weight loss, swollen lymph nodes, body aches, joint swelling, chest pain, shortness of breath, mood changes. POSITIVE muscle aches  Objective  Blood pressure Marland Kitchen)  124/92, height 5\' 2"  (1.575 m), weight 158 lb (71.7 kg).   General: No apparent distress alert and oriented x3 mood and affect normal, dressed appropriately.  Patient does appear uncomfortable. HEENT: Pupils equal, extraocular movements intact  Respiratory: Patient's speak in full sentences and does not appear short of breath  Cardiovascular: No lower extremity edema, non tender, no erythema  Gait normal with good balance and coordination.  MSK: Low back exam has severe loss of lordosis.  Patient has limited range of motion secondary to significant tightness of the musculature.  Patient does have tightness with FABER test.  Negative straight leg test.  Neurovascularly intact distally.    Impression and Recommendations:     The above documentation has been reviewed and is accurate and complete Lyndal Pulley, DO

## 2020-07-29 NOTE — Assessment & Plan Note (Signed)
Patient is having more thanExacerbation of the condition.  Has cervical degenerative disc disease short course of prednisone, gabapentin change the dosing to 600 mg at night Continue with the 100 mg during the day.  Warned potential side effects.  Zanaflex given for breakthrough pain.  New x-rays are ordered today.  Follow-up again in 2 to 4 weeks

## 2020-07-29 NOTE — Patient Instructions (Signed)
Prednisone 40 mg daily for  5 days Gabapentin 300mg  but take 600 mg at night for now and when feeling better go down to 300mg   Can take 100mg  gabapentin during the day Zanaflex 2mg  for breakthrough pain if you continue to have trouble  Xray on the way out Exercises Get new chair  See me in 3-6 weeks

## 2020-08-22 ENCOUNTER — Other Ambulatory Visit: Payer: Self-pay | Admitting: Family Medicine

## 2020-09-05 NOTE — Progress Notes (Signed)
[facility] [address] [place] Phone: [phone] Subjective:   I [name] am serving as a Education administrator for Dr. Hulan Saas.  This visit occurred during the SARS-CoV-2 public health emergency.  Safety protocols were in place, including screening questions prior to the visit, additional usage of staff PPE, and extensive cleaning of exam room while observing appropriate contact time as indicated for disinfecting solutions.   I'm seeing this patient by the request  of:  Biagio Borg, MD  CC: Low back pain and knee pain follow-up  YWV:[id number]   07/29/2020 Patient is having more thanExacerbation of the condition.  Has cervical degenerative disc disease short course of prednisone, gabapentin change the dosing to 600 mg at night Continue with the 100 mg during the day.  Warned potential side effects.  Zanaflex given for breakthrough pain.  New x-rays are ordered today.  Follow-up again in 2 to 4 weeks  Update 09/06/2020 Barbara Gross is a 64 y.o. female coming in with complaint of low back pain. Patient states she is feeling about the same. 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 Last injection helped and states that the cyst has come back.       Past Medical History:  Diagnosis Date  . ABDOMINAL PAIN, LOWER 05/22/2010  . Allergy    pollen   . ANXIETY 12/24/2007  . Arthritis    back , shoulder  . BACTERIAL VAGINITIS 12/24/2007  . BREAST MASS, LEFT 07/15/2008  . DEPRESSION 12/24/2007  . DIVERTICULITIS OF COLON 04/21/2010  . History of migraine    none in years  . HYPERLIPIDEMIA 05/31/2007  . HYPERTENSION 05/31/2007  . Hypertension   . Lesion of bladder   . MENOPAUSE, EARLY 01/04/2010  . Microscopic hematuria 01/03/2009  . PONV (postoperative nausea and vomiting)    Past Surgical History:  Procedure Laterality Date  . ABDOMINAL HYSTERECTOMY  1990  . COLONOSCOPY  2018  . CYSTOSCOPY WITH BIOPSY N/A 03/01/2020   Procedure:  CYSTOSCOPY WITH BIOPSY, FULGERATION BLADDER;  Surgeon: Robley Fries, MD;  Location: [facility];  Service: Urology;  Laterality: N/A;  . EXPLORATORY LAPAROTOMY  as teenager  . OOPHORECTOMY Bilateral 1990  . POLYPECTOMY  2018  . TONSILLECTOMY  as child   Social History   Socioeconomic History  . Marital status: Single    Spouse name: Not on file  . Number of children: Not on file  . Years of education: Not on file  . Highest education level: Not on file  Occupational History  . 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 Smoking status: Former Smoker    Packs/day: 1.00    Years: 15.00    Pack years: 15.00    Types: Cigarettes  . Smokeless tobacco: Never Used  . Tobacco comment: quit in the 80's   Vaping Use  . Vaping Use: Never used  Substance and Sexual Activity  . Alcohol use: Yes    Alcohol/week: 14.0 standard drinks    Types: 14 Glasses of wine per week    Comment: 2 glasses of wine a night  . Drug use: No  . Sexual activity: Not on file  Other Topics Concern  . Not on file  Social History Narrative  . Not on file   Social Determinants of Health   Financial Resource Strain:   . Difficulty of Paying Living Expenses: Not on file  Food Insecurity:   .  Worried About Charity fundraiser in the Last Year: Not on file  . Ran Out of Food in the Last Year: Not on file  Transportation Needs:   . Lack of Transportation (Medical): Not on file  . Lack of Transportation (Non-Medical): Not on file  Physical Activity:   . Days of Exercise per Week: Not on file  . Minutes of Exercise per Session: Not on file  Stress:   . Feeling of Stress : Not on file  Social Connections:   . Frequency of Communication with Friends and Family: Not on file  . Frequency of Social Gatherings with Friends and Family: Not on file  . Attends Religious Services: Not on file  . Active Member of Clubs or Organizations: Not on file  . Attends Theatre manager Meetings: Not on file  . Marital Status: Not on file   Allergies  Allergen Reactions  . Codeine Nausea And Vomiting  . Effexor [Venlafaxine] Nausea Only  . Hydrocodone Nausea And Vomiting   Family History  Problem Relation Age of Onset  . Hypertension Mother   . Ulcers Mother        bleeding  . Hyperlipidemia Mother   . Sudden death Mother        bleeding ulcer 1995  . Diabetes Father   . Hypertension Father   . Hyperlipidemia Father   . Colon polyps Father   . Cancer Other        prostate  . Melanoma Other   . Colon cancer Other        dx'd in 36's  . Stroke Other   . Colon cancer Paternal Grandmother   . Esophageal cancer Neg Hx   . Rectal cancer Neg Hx   . Stomach cancer Neg Hx     Current Outpatient Medications (Endocrine & Metabolic):  .  predniSONE (DELTASONE) 20 MG tablet, Take 2 tablets (40 mg total) by mouth daily with breakfast.  Current Outpatient Medications (Cardiovascular):  .  amLODipine (NORVASC) 5 MG tablet, Take 1 tablet (5 mg total) by mouth daily. Marland Kitchen  losartan (COZAAR) 50 MG tablet, Take 1 tablet (50 mg total) by mouth daily. Marland Kitchen  lovastatin (MEVACOR) 20 MG tablet, Take 1 tablet (20 mg total) by mouth at bedtime.   Current Outpatient Medications (Analgesics):  .  aspirin 81 MG EC tablet, Take 81 mg by mouth daily.     Current Outpatient Medications (Other):  .  citalopram (CELEXA) 10 MG tablet, Take 1.5 tablets (15 mg total) by mouth daily. Marland Kitchen  gabapentin (NEURONTIN) 100 MG capsule, TAKE ONE CAPSULE BY MOUTH TWICE A DAY BURING DAYTIME HOURS  MUST MAKE APPOINTMENT .  gabapentin (NEURONTIN) 300 MG capsule, Take 1 capsule (300 mg total) by mouth at bedtime. Marland Kitchen  tiZANidine (ZANAFLEX) 2 MG tablet, Take 1 tablet (2 mg total) by mouth at bedtime.   Reviewed prior external information including notes and imaging from  primary care provider As well as notes that were available from care everywhere and other healthcare systems.  Past medical  history, social, surgical and family history all reviewed in electronic medical record.  No pertanent information unless stated regarding to the chief complaint.   Review of Systems:  No headache, visual changes, nausea, vomiting, diarrhea, constipation, dizziness, abdominal pain, skin rash, fevers, chills, night sweats, weight loss, swollen lymph nodes, body aches, joint swelling, chest pain, shortness of breath, mood changes. POSITIVE muscle aches  Objective  Blood pressure 126/90, pulse 81, height 5\' 2"  (  1.575 m), weight 161 lb (73 kg), SpO2 94 %.   General: No apparent distress alert and oriented x3 mood and affect normal, dressed appropriately.  HEENT: Pupils equal, extraocular movements intact  Respiratory: Patient's speak in full sentences and does not appear short of breath  Cardiovascular: No lower extremity edema, non tender, no erythema  Left knee exam shows the patient does have more of a fullness on the anterior lateral aspect of the knee a little different than usual.  No significant swelling noted in the posterior medial aspect of the knee.  Patient does have a trace effusion of the patellofemoral joint.  Lateral tracking of the patella with positive patellar grind noted  Low back exam does show tightness noted.  Negative straight leg test though noted today.  Neurovascularly intact distally  Limited musculoskeletal ultrasound was performed and interpreted by Lyndal Pulley  Limited ultrasound of patient's left knee shows that there is no reaccumulation of a cyst noted today.  Patient does have a mild anterior lateral cyst noted but seems to have ruptured recently.  Likely ganglion versus parameniscal.  Patient does have trace effusion noted of the patellofemoral joint with narrowing of the patellofemoral joint. Impression: Effusion of the knee joints, patellofemoral mild arthritis, mild cyst anteriorly likely ruptured  After informed written and verbal consent, patient was seated  on exam table. Left knee was prepped with alcohol swab and utilizing anterolateral approach, patient's left knee space was injected with 4:1  marcaine 0.5%: Kenalog 40mg /dL. Patient tolerated the procedure well without immediate complications.    Impression and Recommendations:     The above documentation has been reviewed and is accurate and complete Lyndal Pulley, DO

## 2020-09-06 ENCOUNTER — Ambulatory Visit: Payer: Self-pay

## 2020-09-06 ENCOUNTER — Ambulatory Visit: Payer: BC Managed Care – PPO | Admitting: Family Medicine

## 2020-09-06 ENCOUNTER — Encounter: Payer: Self-pay | Admitting: Family Medicine

## 2020-09-06 ENCOUNTER — Other Ambulatory Visit: Payer: Self-pay

## 2020-09-06 VITALS — BP 126/90 | HR 81 | Ht 62.0 in | Wt 161.0 lb

## 2020-09-06 DIAGNOSIS — M5416 Radiculopathy, lumbar region: Secondary | ICD-10-CM

## 2020-09-06 DIAGNOSIS — G8929 Other chronic pain: Secondary | ICD-10-CM | POA: Diagnosis not present

## 2020-09-06 DIAGNOSIS — M25562 Pain in left knee: Secondary | ICD-10-CM | POA: Diagnosis not present

## 2020-09-06 NOTE — Patient Instructions (Addendum)
Good to see you Gabapentin 200 mg 2 time a day 600 mg at night If that is better send message and we will send another prescription  Injected the knee hopefully better if not MRI See me again in 2-3 months

## 2020-09-06 NOTE — Assessment & Plan Note (Addendum)
Patient is taking 600 mg at night of the gabapentin.  100 mg during the day.  Increase patient's gabapentin during the day to 200 mg twice daily and 600 at night.  We will continue to monitor closely.  Follow-up again to 3 months continued pain will need to consider the possibility of an MRI which patient also declined again secondary to financial constraints and not wanting to do any invasive procedures.

## 2020-09-06 NOTE — Assessment & Plan Note (Signed)
Patient has pain more in the anterior aspect of the knee at this time.  Not as much and did not notice any swelling on ultrasound for the Baker's cyst.  We discussed with patient about further evaluation with a MRI.  Patient declined this significantly.  Patient x-rays were unremarkable for any bony abnormality.  Would be looking for more of a meniscal injury or some other possibility for the instability.  Discussed icing regimen and home exercises.  Follow-up again in 4 to 8 weeks

## 2020-09-12 ENCOUNTER — Encounter: Payer: Self-pay | Admitting: Family Medicine

## 2020-09-13 ENCOUNTER — Other Ambulatory Visit: Payer: Self-pay

## 2020-09-13 MED ORDER — GABAPENTIN 100 MG PO CAPS
ORAL_CAPSULE | ORAL | 0 refills | Status: DC
Start: 2020-09-13 — End: 2020-11-20

## 2020-09-13 MED ORDER — GABAPENTIN 300 MG PO CAPS
300.0000 mg | ORAL_CAPSULE | Freq: Every day | ORAL | 0 refills | Status: DC
Start: 2020-09-13 — End: 2021-01-25

## 2020-11-20 ENCOUNTER — Other Ambulatory Visit: Payer: Self-pay | Admitting: Internal Medicine

## 2020-11-20 ENCOUNTER — Other Ambulatory Visit: Payer: Self-pay | Admitting: Family Medicine

## 2020-11-21 NOTE — Telephone Encounter (Signed)
Left patient message to call back to confirm how she is using medication.

## 2020-11-21 NOTE — Telephone Encounter (Signed)
Patient is using 300 QHS and then 100mg  BID. Does not need 400mg  prescription.

## 2020-12-05 NOTE — Progress Notes (Signed)
[facility] [address] [place] Phone: [phone] Subjective:   I Barbara Gross am serving as a Education administrator for Dr. Hulan Saas.  This visit occurred during the SARS-CoV-2 public health emergency.  Safety protocols were in place, including screening questions prior to the visit, additional usage of staff PPE, and extensive cleaning of exam room while observing appropriate contact time as indicated for disinfecting solutions.   I'm seeing this patient by the request  of:  Biagio Borg, MD  CC: Knee pain follow-up  [id number]   09/06/2020 Patient is taking 600 mg at night of the gabapentin.  100 mg during the day.  Increase patient's gabapentin during the day to 200 mg twice daily and 600 at night.  We will continue to monitor closely.  Follow-up again to 3 months continued pain will need to consider the possibility of an MRI which patient also declined again secondary to financial constraints and not wanting to do any invasive procedures.  Update 12/06/2020 Barbara Gross is a 65 y.o. female coming in with complaint of back pain and left knee pain. Patient states the left knee pain comes and goes. Some swelling today. A few weeks ago she states that she is limping. States the back is doing well today. Patient states that she is confused about her gabapentin prescription.  Patient has had a Baker's cyst previously.  States that this is more on the anterior aspect of the knee.  No swelling of the posterior aspect of the knee and no swelling down the calf.  Patient is able to walk but does have some instability.     Past Medical History:  Diagnosis Date  . ABDOMINAL PAIN, LOWER 05/22/2010  . Allergy    pollen   . ANXIETY 12/24/2007  . Arthritis    back , shoulder  . BACTERIAL VAGINITIS 12/24/2007  . BREAST MASS, LEFT 07/15/2008  . DEPRESSION 12/24/2007  . DIVERTICULITIS OF COLON 04/21/2010  . History of migraine    none in years  .  HYPERLIPIDEMIA 05/31/2007  . HYPERTENSION 05/31/2007  . Hypertension   . Lesion of bladder   . MENOPAUSE, EARLY 01/04/2010  . Microscopic hematuria 01/03/2009  . PONV (postoperative nausea and vomiting)    Past Surgical History:  Procedure Laterality Date  . ABDOMINAL HYSTERECTOMY  1990  . COLONOSCOPY  2018  . CYSTOSCOPY WITH BIOPSY N/A 03/01/2020   Procedure: CYSTOSCOPY WITH BIOPSY, FULGERATION BLADDER;  Surgeon: Robley Fries, MD;  Location: [facility];  Service: Urology;  Laterality: N/A;  . EXPLORATORY LAPAROTOMY  as teenager  . OOPHORECTOMY Bilateral 1990  . POLYPECTOMY  2018  . TONSILLECTOMY  as child   Social History   Socioeconomic History  . Marital status: Single    Spouse name: Not on file  . Number of children: Not on file  . Years of education: Not on file  . Highest education level: Not on file  Occupational History  . 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 Smoking status: Former Smoker    Packs/day: 1.00    Years: 15.00    Pack years: 15.00    Types: Cigarettes  . Smokeless tobacco: Never Used  . Tobacco comment: quit in the 80's   Vaping Use  . Vaping Use: Never used  Substance and Sexual Activity  . Alcohol use: Yes    Alcohol/week: 14.0 standard drinks    Types: 14 Glasses of wine per  week    Comment: 2 glasses of wine a night  . Drug use: No  . Sexual activity: Not on file  Other Topics Concern  . Not on file  Social History Narrative  . Not on file   Social Determinants of Health   Financial Resource Strain: Not on file  Food Insecurity: Not on file  Transportation Needs: Not on file  Physical Activity: Not on file  Stress: Not on file  Social Connections: Not on file   Allergies  Allergen Reactions  . Codeine Nausea And Vomiting  . Effexor [Venlafaxine] Nausea Only  . Hydrocodone Nausea And Vomiting   Family History  Problem Relation Age of Onset  . Hypertension Mother   . Ulcers Mother         bleeding  . Hyperlipidemia Mother   . Sudden death Mother        bleeding ulcer 1995  . Diabetes Father   . Hypertension Father   . Hyperlipidemia Father   . Colon polyps Father   . Cancer Other        prostate  . Melanoma Other   . Colon cancer Other        dx'd in 51's  . Stroke Other   . Colon cancer Paternal Grandmother   . Esophageal cancer Neg Hx   . Rectal cancer Neg Hx   . Stomach cancer Neg Hx     Current Outpatient Medications (Endocrine & Metabolic):  .  predniSONE (DELTASONE) 20 MG tablet, Take 2 tablets (40 mg total) by mouth daily with breakfast.  Current Outpatient Medications (Cardiovascular):  .  amLODipine (NORVASC) 5 MG tablet, Take 1 tablet (5 mg total) by mouth daily. Marland Kitchen  losartan (COZAAR) 50 MG tablet, Take 1 tablet (50 mg total) by mouth daily. Marland Kitchen  lovastatin (MEVACOR) 20 MG tablet, Take 1 tablet (20 mg total) by mouth at bedtime.   Current Outpatient Medications (Analgesics):  .  aspirin 81 MG EC tablet, Take 81 mg by mouth daily.   Current Outpatient Medications (Other):  .  citalopram (CELEXA) 10 MG tablet, Take 1.5 tablets (15 mg total) by mouth daily. Marland Kitchen  gabapentin (NEURONTIN) 100 MG capsule, TAKE ONE CAPSULE BY MOUTH TWICE A DAY DURING DAYTIME HOURS (MUST MAKE APPT) .  gabapentin (NEURONTIN) 300 MG capsule, Take 1 capsule (300 mg total) by mouth at bedtime. Marland Kitchen  tiZANidine (ZANAFLEX) 2 MG tablet, Take 1 tablet (2 mg total) by mouth at bedtime.   Reviewed prior external information including notes and imaging from  primary care provider As well as notes that were available from care everywhere and other healthcare systems.  Past medical history, social, surgical and family history all reviewed in electronic medical record.  No pertanent information unless stated regarding to the chief complaint.   Review of Systems:  No headache, visual changes, nausea, vomiting, diarrhea, constipation, dizziness, abdominal pain, skin rash, fevers, chills,  night sweats, weight loss, swollen lymph nodes, body aches, joint swelling, chest pain, shortness of breath, mood changes. POSITIVE muscle aches  Objective  Blood pressure 120/90, pulse 97, height 5\' 2"  (1.575 m), weight 162 lb (73.5 kg), SpO2 99 %.   General: No apparent distress alert and oriented x3 mood and affect normal, dressed appropriately.  HEENT: Pupils equal, extraocular movements intact  Respiratory: Patient's speak in full sentences and does not appear short of breath  Cardiovascular: No lower extremity edema, non tender, no erythema  Gait mild antalgic gait MSK: Left knee does have some  swelling of the patellofemoral.  Lateral tracking of the patella noted with the patella grind.  Mild instability with valgus and varus force.  Full range of motion no noted.  After informed written and verbal consent, patient was seated on exam table. Left knee was prepped with alcohol swab and utilizing anterolateral approach, patient's left knee space was injected with 4:1  marcaine 0.5%: Kenalog 40mg /dL. Patient tolerated the procedure well without immediate complications.    Impression and Recommendations:     The above documentation has been reviewed and is accurate and complete Lyndal Pulley, DO

## 2020-12-06 ENCOUNTER — Encounter: Payer: Self-pay | Admitting: Family Medicine

## 2020-12-06 ENCOUNTER — Other Ambulatory Visit: Payer: Self-pay

## 2020-12-06 ENCOUNTER — Ambulatory Visit: Payer: BC Managed Care – PPO | Admitting: Family Medicine

## 2020-12-06 DIAGNOSIS — M25562 Pain in left knee: Secondary | ICD-10-CM | POA: Diagnosis not present

## 2020-12-06 DIAGNOSIS — M5416 Radiculopathy, lumbar region: Secondary | ICD-10-CM

## 2020-12-06 NOTE — Assessment & Plan Note (Signed)
History radicular pain.  Doing gabapentin 100 mg in a.m. and p.m. and 300 mg at night.  At this time we will change the medications appropriately when patient does need a refill.

## 2020-12-06 NOTE — Patient Instructions (Addendum)
Good to see you  Gabapnetin 100mg  am, 100mg  pm, 300mg  at night Write to use if you want MRI See me in 3 months

## 2020-12-06 NOTE — Assessment & Plan Note (Signed)
Patient given injection today.  Tolerated the procedure well.  We did discuss with patient though that I do feel with patient having fairly regular x-rays that advanced imaging such as an MRI would be the most beneficial at this time.  Patient wants to avoid that at this moment.  Patient states in 7 to 8 months will be on a new insurance were out-of-pocket may be cheaper.  Patient also feels that the injections have been helpful previously.  Follow-up with me again in 2 to 3 months.

## 2021-01-25 ENCOUNTER — Other Ambulatory Visit: Payer: Self-pay | Admitting: Family Medicine

## 2021-02-13 ENCOUNTER — Encounter: Payer: Self-pay | Admitting: Family Medicine

## 2021-02-15 NOTE — Telephone Encounter (Signed)
Any thoughts on where to schedule?

## 2021-02-15 NOTE — Telephone Encounter (Signed)
Appointment scheduled.

## 2021-02-17 NOTE — Progress Notes (Signed)
[place] Phone: [phone] Subjective:   Barbara Gross, am serving as a scribe for Dr. Hulan Saas. This visit occurred during the SARS-CoV-2 public health emergency.  Safety protocols were in place, including screening questions prior to the visit, additional usage of staff PPE, and extensive cleaning of exam room while observing appropriate contact time as indicated for disinfecting solutions.   I'm seeing this patient by the request  of:  Biagio Borg, MD  CC: knee and back pain   [id number]   12/06/2020 History radicular pain.  Doing gabapentin 100 mg in a.m. and p.m. and 300 mg at night.  At this time we will change the medications appropriately when patient does need a refill.  Patient given injection today.  Tolerated the procedure well.  We did discuss with patient though that I do feel with patient having fairly regular x-rays that advanced imaging such as an MRI would be the most beneficial at this time.  Patient wants to avoid that at this moment.  Patient states in 7 to 8 months will be on a new insurance were out-of-pocket may be cheaper.  Patient also feels that the injections have been helpful previously.  Follow-up with me again in 2 to 3 months.   Update 02/21/2021 Barbara Gross is a 65 y.o. female coming in with complaint of back pain and L knee pain. Patient states that her pain has increased recently. Hard to flex knee and having pain with inclines and stairs.   Injected knee 3/8     Past Medical History:  Diagnosis Date  . ABDOMINAL PAIN, LOWER 05/22/2010  . Allergy    pollen   . ANXIETY 12/24/2007  . Arthritis    back , shoulder  . BACTERIAL VAGINITIS 12/24/2007  . BREAST MASS, LEFT 07/15/2008  . DEPRESSION 12/24/2007  . DIVERTICULITIS OF COLON 04/21/2010  . History of migraine    none in years  . HYPERLIPIDEMIA 05/31/2007  . HYPERTENSION 05/31/2007  . Hypertension   . Lesion  of bladder   . MENOPAUSE, EARLY 01/04/2010  . Microscopic hematuria 01/03/2009  . PONV (postoperative nausea and vomiting)    Past Surgical History:  Procedure Laterality Date  . ABDOMINAL HYSTERECTOMY  1990  . COLONOSCOPY  2018  . CYSTOSCOPY WITH BIOPSY N/A 03/01/2020   Procedure: CYSTOSCOPY WITH BIOPSY, FULGERATION BLADDER;  Surgeon: Robley Fries, MD;  Location: [facility];  Service: Urology;  Laterality: N/A;  . EXPLORATORY LAPAROTOMY  as teenager  . OOPHORECTOMY Bilateral 1990  . POLYPECTOMY  2018  . TONSILLECTOMY  as child   Social History   Socioeconomic History  . Marital status: Single    Spouse name: Not on file  . Number of children: Not on file  . Years of education: Not on file  . Highest education level: Not on file  Occupational History  . 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 Smoking status: Former Smoker    Packs/day: 1.00    Years: 15.00    Pack years: 15.00    Types: Cigarettes  . Smokeless tobacco: Never Used  . Tobacco comment: quit in the 80's   Vaping Use  . Vaping Use: Never used  Substance and Sexual Activity  . Alcohol use: Yes    Alcohol/week: 14.0 standard drinks    Types: 14 Glasses of wine per week    Comment: 2 glasses of wine a  night  . Drug use: Gross  . Sexual activity: Not on file  Other Topics Concern  . Not on file  Social History Narrative  . Not on file   Social Determinants of Health   Financial Resource Strain: Not on file  Food Insecurity: Not on file  Transportation Needs: Not on file  Physical Activity: Not on file  Stress: Not on file  Social Connections: Not on file   Allergies  Allergen Reactions  . Codeine Nausea And Vomiting  . Effexor [Venlafaxine] Nausea Only  . Hydrocodone Nausea And Vomiting   Family History  Problem Relation Age of Onset  . Hypertension Mother   . Ulcers Mother        bleeding  . Hyperlipidemia Mother   . Sudden death Mother        bleeding  ulcer 1995  . Diabetes Father   . Hypertension Father   . Hyperlipidemia Father   . Colon polyps Father   . Cancer Other        prostate  . Melanoma Other   . Colon cancer Other        dx'd in 12's  . Stroke Other   . Colon cancer Paternal Grandmother   . Esophageal cancer Neg Hx   . Rectal cancer Neg Hx   . Stomach cancer Neg Hx     Current Outpatient Medications (Endocrine & Metabolic):  .  predniSONE (DELTASONE) 20 MG tablet, Take 2 tablets (40 mg total) by mouth daily with breakfast.  Current Outpatient Medications (Cardiovascular):  .  amLODipine (NORVASC) 5 MG tablet, Take 1 tablet (5 mg total) by mouth daily. Marland Kitchen  losartan (COZAAR) 50 MG tablet, Take 1 tablet (50 mg total) by mouth daily. Marland Kitchen  lovastatin (MEVACOR) 20 MG tablet, Take 1 tablet (20 mg total) by mouth at bedtime.   Current Outpatient Medications (Analgesics):  .  aspirin 81 MG EC tablet, Take 81 mg by mouth daily.   Current Outpatient Medications (Other):  .  citalopram (CELEXA) 10 MG tablet, Take 1.5 tablets (15 mg total) by mouth daily. Marland Kitchen  gabapentin (NEURONTIN) 100 MG capsule, Take 1 capsule (100 mg total) by mouth 2 (two) times daily. Marland Kitchen  gabapentin (NEURONTIN) 300 MG capsule, TAKE ONE CAPSULE BY MOUTH EVERY NIGHT AT BEDTIME .  tiZANidine (ZANAFLEX) 2 MG tablet, Take 1 tablet (2 mg total) by mouth at bedtime.   Reviewed prior external information including notes and imaging from  primary care provider As well as notes that were available from care everywhere and other healthcare systems.  Past medical history, social, surgical and family history all reviewed in electronic medical record.  Gross pertanent information unless stated regarding to the chief complaint.   Review of Systems:  Gross headache, visual changes, nausea, vomiting, diarrhea, constipation, dizziness, abdominal pain, skin rash, fevers, chills, night sweats, weight loss, swollen lymph nodes, body aches,chest pain, shortness of breath, mood  changes. POSITIVE muscle aches, joint swelling  Objective  Blood pressure 110/70, pulse (!) 111, height 5\' 2"  (1.575 m), weight 164 lb (74.4 kg), SpO2 96 %.   General: Gross apparent distress alert and oriented x3 mood and affect normal, dressed appropriately.  HEENT: Pupils equal, extraocular movements intact  Respiratory: Patient's speak in full sentences and does not appear short of breath  Cardiovascular: Gross lower extremity edema, non tender, Gross erythema  Gait antalgic  Left knee exam shows the patient does have right knee effusion noted.  Patient lacks last 10 degrees of flexion.  Mild instability of the patellofemoral joint noted.  Mild diffuse tenderness to palpation.  Gross pain in the calf and Gross fullness noted in the popliteal area.  Procedure: Real-time Ultrasound Guided Injection of left knee Device: GE Logiq Q7 Ultrasound guided injection is preferred based studies that show increased duration, increased effect, greater accuracy, decreased procedural pain, increased response rate, and decreased cost with ultrasound guided versus blind injection.  Verbal informed consent obtained.  Time-out conducted.  Noted Gross overlying erythema, induration, or other signs of local infection.  Skin prepped in a sterile fashion.  Local anesthesia: Topical Ethyl chloride.  With sterile technique and under real time ultrasound guidance: With a 22-gauge 2 inch needle patient was injected with 4 cc of 0.5% Marcaine and 1 cc of Kenalog 40 mg/dL. This was from a superior lateral approach.  Completed without difficulty  Pain immediately resolved suggesting accurate placement of the medication.  Advised to call if fevers/chills, erythema, induration, drainage, or persistent bleeding.  Impression: Technically successful ultrasound guided injection.    Impression and Recommendations:     The above documentation has been reviewed and is accurate and complete Lyndal Pulley, DO

## 2021-02-20 ENCOUNTER — Other Ambulatory Visit: Payer: Self-pay | Admitting: Family Medicine

## 2021-02-21 ENCOUNTER — Ambulatory Visit (INDEPENDENT_AMBULATORY_CARE_PROVIDER_SITE_OTHER): Payer: BC Managed Care – PPO

## 2021-02-21 ENCOUNTER — Ambulatory Visit: Payer: Self-pay

## 2021-02-21 ENCOUNTER — Other Ambulatory Visit: Payer: Self-pay

## 2021-02-21 ENCOUNTER — Encounter: Payer: Self-pay | Admitting: Family Medicine

## 2021-02-21 ENCOUNTER — Ambulatory Visit: Payer: BC Managed Care – PPO | Admitting: Family Medicine

## 2021-02-21 VITALS — BP 110/70 | HR 111 | Ht 62.0 in | Wt 164.0 lb

## 2021-02-21 DIAGNOSIS — M25562 Pain in left knee: Secondary | ICD-10-CM | POA: Diagnosis not present

## 2021-02-21 DIAGNOSIS — G8929 Other chronic pain: Secondary | ICD-10-CM | POA: Diagnosis not present

## 2021-02-21 DIAGNOSIS — M25462 Effusion, left knee: Secondary | ICD-10-CM | POA: Diagnosis not present

## 2021-02-21 MED ORDER — GABAPENTIN 100 MG PO CAPS: 100.0000 mg | ORAL_CAPSULE | Freq: Two times a day (BID) | ORAL | 0 refills | Status: DC

## 2021-02-21 NOTE — Assessment & Plan Note (Signed)
Aspiration done today.  We will try the possibility of a viscosupplementation.  Patient x-rays previously did not show much arthritic changes.  Patient continues to have a recurrent effusion noted.  Today Baker cyst was not seen to be enlarged.  We discussed advanced imaging but patient would like to wait until she is 65 years old before she gets the MRI.  States it is secondary to financial constraints.  Patient will follow-up with me again though in 6 weeks.

## 2021-02-21 NOTE — Patient Instructions (Addendum)
Xray today See me again in 6 weeks

## 2021-02-22 LAB — SYNOVIAL FLUID ANALYSIS, COMPLETE
Basophils, %: 0 %
Eosinophils-Synovial: 0 % (ref 0–2)
Lymphocytes-Synovial Fld: 6 % (ref 0–74)
Monocyte/Macrophage: 78 % — ABNORMAL HIGH (ref 0–69)
Neutrophil, Synovial: 16 % (ref 0–24)
Synoviocytes, %: 0 % (ref 0–15)
WBC, Synovial: 73 cells/uL (ref <150)

## 2021-03-08 ENCOUNTER — Ambulatory Visit: Payer: BC Managed Care – PPO | Admitting: Family Medicine

## 2021-03-30 DIAGNOSIS — L570 Actinic keratosis: Secondary | ICD-10-CM | POA: Diagnosis not present

## 2021-04-04 ENCOUNTER — Ambulatory Visit: Payer: BC Managed Care – PPO | Admitting: Family Medicine

## 2021-04-04 ENCOUNTER — Other Ambulatory Visit: Payer: Self-pay

## 2021-04-04 ENCOUNTER — Encounter: Payer: Self-pay | Admitting: Family Medicine

## 2021-04-04 DIAGNOSIS — M7122 Synovial cyst of popliteal space [Baker], left knee: Secondary | ICD-10-CM

## 2021-04-04 MED ORDER — DICLOFENAC SODIUM 2 % EX SOLN: 2.0000 g | Freq: Two times a day (BID) | CUTANEOUS | 3 refills | Status: DC

## 2021-04-04 NOTE — Patient Instructions (Addendum)
Good to see you Hold on injection today Hold on MRI until October See me again in 6 weeks Sending in pennsaid

## 2021-04-04 NOTE — Assessment & Plan Note (Signed)
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  Continues to have pain.  Patient has failed conservative therapy and would like an MRI.  Patient declines any more than until she was able to potentially switch insurances.  Topical anti-inflammatories prescribed today.  Warned of potential side effects.  Patient will follow-up again in 6 weeks and will consider patient for advanced imaging.  Patient knows if worsening symptoms she may not have a choice and may need the MRI sooner than later.

## 2021-04-04 NOTE — Progress Notes (Signed)
[place] [address] [place] Phone: [phone] Subjective:   I Barbara Gross am serving as a Education administrator for Dr. Hulan Saas.  This visit occurred during the SARS-CoV-2 public health emergency.  Safety protocols were in place, including screening questions prior to the visit, additional usage of staff PPE, and extensive cleaning of exam room while observing appropriate contact time as indicated for disinfecting solutions.   I'm seeing this patient by the request  of:  Biagio Borg, MD  CC:  left knee pain  [id number]  02/21/2021 Aspiration done today.  We will try the possibility of a viscosupplementation.  Patient x-rays previously did not show much arthritic changes.  Patient continues to have a recurrent effusion noted.  Today Baker cyst was not seen to be enlarged.  We discussed advanced imaging but patient would like to wait until she is 65 years old before she gets the MRI.  States it is secondary to financial constraints.  Patient will follow-up with me again though in 6 weeks.  Update 04/04/2021 Barbara Gross is a 65 y.o. female coming in with complaint of L knee pain.  Patient was found to have a recurrent effusion noted of the left knee.  Did not have enlargement of the Baker's cyst just effusion of the knee.  Patient had aspiration done on May 24.  Patient states the knee is a little better than last time. Still giving her issues and she is limited to what she can do.  Xray L knee 02/21/2021 IMPRESSION: No fracture or dislocation.  Small effusion.      Past Medical History:  Diagnosis Date   ABDOMINAL PAIN, LOWER 05/22/2010   Allergy    pollen    ANXIETY 12/24/2007   Arthritis    back , shoulder   BACTERIAL VAGINITIS 12/24/2007   BREAST MASS, LEFT 07/15/2008   DEPRESSION 12/24/2007   DIVERTICULITIS OF COLON 04/21/2010   History of migraine    none in years   HYPERLIPIDEMIA 05/31/2007   HYPERTENSION 05/31/2007    Hypertension    Lesion of bladder    MENOPAUSE, EARLY 01/04/2010   Microscopic hematuria 01/03/2009   PONV (postoperative nausea and vomiting)    Past Surgical History:  Procedure Laterality Date   ABDOMINAL HYSTERECTOMY  1990   COLONOSCOPY  2018   CYSTOSCOPY WITH BIOPSY N/A 03/01/2020   Procedure: CYSTOSCOPY WITH BIOPSY, FULGERATION BLADDER;  Surgeon: Robley Fries, MD;  Location: [facility];  Service: Urology;  Laterality: N/A;   EXPLORATORY LAPAROTOMY  as teenager   OOPHORECTOMY Bilateral 1990   POLYPECTOMY  2018   TONSILLECTOMY  as child   Social History   Socioeconomic History   Marital status: Single    Spouse name: Not on file   Number of children: Not on file   Years of education: Not on file   Highest education level: Not on file  Occupational History   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  Tobacco Use   Smoking status: Former    Packs/day: 1.00    Years: 15.00    Pack years: 15.00    Types: Cigarettes   Smokeless tobacco: Never   Tobacco comments:    quit in the 80's   Vaping Use   Vaping Use: Never used  Substance and Sexual Activity   Alcohol use: Yes    Alcohol/week: 14.0 standard drinks    Types: 14 Glasses of wine per week    Comment:  2 glasses of wine a night   Drug use: No   Sexual activity: Not on file  Other Topics Concern   Not on file  Social History Narrative   Not on file   Social Determinants of Health   Financial Resource Strain: Not on file  Food Insecurity: Not on file  Transportation Needs: Not on file  Physical Activity: Not on file  Stress: Not on file  Social Connections: Not on file   Allergies  Allergen Reactions   Codeine Nausea And Vomiting   Effexor [Venlafaxine] Nausea Only   Hydrocodone Nausea And Vomiting   Family History  Problem Relation Age of Onset   Hypertension Mother    Ulcers Mother        bleeding   Hyperlipidemia Mother    Sudden death Mother        bleeding ulcer 1995    Diabetes Father    Hypertension Father    Hyperlipidemia Father    Colon polyps Father    Cancer Other        prostate   Melanoma Other    Colon cancer Other        dx'd in 66's   Stroke Other    Colon cancer Paternal Grandmother    Esophageal cancer Neg Hx    Rectal cancer Neg Hx    Stomach cancer Neg Hx     Current Outpatient Medications (Endocrine & Metabolic):    predniSONE (DELTASONE) 20 MG tablet, Take 2 tablets (40 mg total) by mouth daily with breakfast.  Current Outpatient Medications (Cardiovascular):    amLODipine (NORVASC) 5 MG tablet, Take 1 tablet (5 mg total) by mouth daily.   losartan (COZAAR) 50 MG tablet, Take 1 tablet (50 mg total) by mouth daily.   lovastatin (MEVACOR) 20 MG tablet, Take 1 tablet (20 mg total) by mouth at bedtime.   Current Outpatient Medications (Analgesics):    aspirin 81 MG EC tablet, Take 81 mg by mouth daily.   Current Outpatient Medications (Other):    citalopram (CELEXA) 10 MG tablet, Take 1.5 tablets (15 mg total) by mouth daily.   Diclofenac Sodium (PENNSAID) 2 % SOLN, Place 2 g onto the skin 2 (two) times daily.   gabapentin (NEURONTIN) 100 MG capsule, Take 1 capsule (100 mg total) by mouth 2 (two) times daily.   gabapentin (NEURONTIN) 300 MG capsule, TAKE ONE CAPSULE BY MOUTH EVERY NIGHT AT BEDTIME   tiZANidine (ZANAFLEX) 2 MG tablet, Take 1 tablet (2 mg total) by mouth at bedtime.   Reviewed prior external information including notes and imaging from  primary care provider As well as notes that were available from care everywhere and other healthcare systems.  Past medical history, social, surgical and family history all reviewed in electronic medical record.  No pertanent information unless stated regarding to the chief complaint.   Review of Systems:  No headache, visual changes, nausea, vomiting, diarrhea, constipation, dizziness, abdominal pain, skin rash, fevers, chills, night sweats, weight loss, swollen lymph nodes,  body aches, joint swelling, chest pain, shortness of breath, mood changes. POSITIVE muscle aches  Objective  Blood pressure (!) 158/90, pulse (!) 103, height 5\' 2"  (1.575 m), weight 163 lb (73.9 kg), SpO2 100 %.   General: No apparent distress alert and oriented x3 mood and affect normal, dressed appropriately.  HEENT: Pupils equal, extraocular movements intact  Respiratory: Patient's speak in full sentences and does not appear short of breath  Cardiovascular: No lower extremity edema, non tender, no  erythema  Gait mild antalgic MSK: Left knee exam shows patient does have trace effusion noted. Does have mildly positive McMurray's.  Patient does have full crepitus noted.  Mild lateral tracking of the patella.  Lacks the last 10 degrees of flexion.  Lacks last 2 degrees of extension.    Impression and Recommendations:     The above documentation has been reviewed and is accurate and complete Lyndal Pulley, DO

## 2021-04-11 DIAGNOSIS — Z1231 Encounter for screening mammogram for malignant neoplasm of breast: Secondary | ICD-10-CM | POA: Diagnosis not present

## 2021-04-11 LAB — HM MAMMOGRAPHY

## 2021-04-24 ENCOUNTER — Other Ambulatory Visit: Payer: Self-pay | Admitting: Family Medicine

## 2021-04-26 ENCOUNTER — Telehealth: Payer: Self-pay

## 2021-04-26 ENCOUNTER — Telehealth: Payer: Self-pay | Admitting: Internal Medicine

## 2021-04-26 ENCOUNTER — Encounter: Payer: Self-pay | Admitting: Internal Medicine

## 2021-04-26 ENCOUNTER — Other Ambulatory Visit: Payer: BC Managed Care – PPO

## 2021-04-26 DIAGNOSIS — E7800 Pure hypercholesterolemia, unspecified: Secondary | ICD-10-CM

## 2021-04-26 DIAGNOSIS — E538 Deficiency of other specified B group vitamins: Secondary | ICD-10-CM

## 2021-04-26 DIAGNOSIS — R739 Hyperglycemia, unspecified: Secondary | ICD-10-CM

## 2021-04-26 DIAGNOSIS — E559 Vitamin D deficiency, unspecified: Secondary | ICD-10-CM

## 2021-04-26 NOTE — Telephone Encounter (Signed)
Ok done

## 2021-04-26 NOTE — Telephone Encounter (Signed)
Ok labs ordered 

## 2021-04-26 NOTE — Telephone Encounter (Signed)
Pt was at [facility] lab waiting on lab orders to be placed for her physical 05/01/21. Pt would like future labs to be placed prior to OV. Thank you.

## 2021-04-26 NOTE — Telephone Encounter (Signed)
Called and informed pt, she verbalized understanding.

## 2021-04-28 ENCOUNTER — Other Ambulatory Visit (INDEPENDENT_AMBULATORY_CARE_PROVIDER_SITE_OTHER): Payer: BC Managed Care – PPO

## 2021-04-28 DIAGNOSIS — E559 Vitamin D deficiency, unspecified: Secondary | ICD-10-CM

## 2021-04-28 DIAGNOSIS — E538 Deficiency of other specified B group vitamins: Secondary | ICD-10-CM | POA: Diagnosis not present

## 2021-04-28 DIAGNOSIS — E7800 Pure hypercholesterolemia, unspecified: Secondary | ICD-10-CM

## 2021-04-28 DIAGNOSIS — R739 Hyperglycemia, unspecified: Secondary | ICD-10-CM

## 2021-04-28 LAB — VITAMIN B12: Vitamin B-12: 757 pg/mL (ref 211–911)

## 2021-04-28 LAB — HEPATIC FUNCTION PANEL
ALT: 30 U/L (ref 0–35)
AST: 23 U/L (ref 0–37)
Albumin: 4.5 g/dL (ref 3.5–5.2)
Alkaline Phosphatase: 64 U/L (ref 39–117)
Bilirubin, Direct: 0.1 mg/dL (ref 0.0–0.3)
Total Bilirubin: 0.5 mg/dL (ref 0.2–1.2)
Total Protein: 7.5 g/dL (ref 6.0–8.3)

## 2021-04-28 LAB — CBC WITH DIFFERENTIAL/PLATELET
Basophils Absolute: 0 10*3/uL (ref 0.0–0.1)
Basophils Relative: 0.4 % (ref 0.0–3.0)
Eosinophils Absolute: 0.2 10*3/uL (ref 0.0–0.7)
Eosinophils Relative: 2.5 % (ref 0.0–5.0)
HCT: 43.5 % (ref 36.0–46.0)
Hemoglobin: 14.7 g/dL (ref 12.0–15.0)
Lymphocytes Relative: 35.8 % (ref 12.0–46.0)
Lymphs Abs: 2.5 10*3/uL (ref 0.7–4.0)
MCHC: 33.8 g/dL (ref 30.0–36.0)
MCV: 100.4 fl — ABNORMAL HIGH (ref 78.0–100.0)
Monocytes Absolute: 0.6 10*3/uL (ref 0.1–1.0)
Monocytes Relative: 8.3 % (ref 3.0–12.0)
Neutro Abs: 3.6 10*3/uL (ref 1.4–7.7)
Neutrophils Relative %: 53 % (ref 43.0–77.0)
Platelets: 307 10*3/uL (ref 150.0–400.0)
RBC: 4.33 Mil/uL (ref 3.87–5.11)
RDW: 13.7 % (ref 11.5–15.5)
WBC: 6.9 10*3/uL (ref 4.0–10.5)

## 2021-04-28 LAB — URINALYSIS, ROUTINE W REFLEX MICROSCOPIC
Bilirubin Urine: NEGATIVE
Ketones, ur: NEGATIVE
Leukocytes,Ua: NEGATIVE
Nitrite: NEGATIVE
Specific Gravity, Urine: 1.02 (ref 1.000–1.030)
Total Protein, Urine: NEGATIVE
Urine Glucose: NEGATIVE
Urobilinogen, UA: 0.2 (ref 0.0–1.0)
WBC, UA: NONE SEEN (ref 0–?)
pH: 6 (ref 5.0–8.0)

## 2021-04-28 LAB — BASIC METABOLIC PANEL
BUN: 12 mg/dL (ref 6–23)
CO2: 28 mEq/L (ref 19–32)
Calcium: 9.9 mg/dL (ref 8.4–10.5)
Chloride: 102 mEq/L (ref 96–112)
Creatinine, Ser: 0.6 mg/dL (ref 0.40–1.20)
GFR: 94.61 mL/min (ref >60.00)
Glucose, Bld: 94 mg/dL (ref 70–99)
Potassium: 4.2 mEq/L (ref 3.5–5.1)
Sodium: 140 mEq/L (ref 135–145)

## 2021-04-28 LAB — LIPID PANEL
Cholesterol: 168 mg/dL (ref 0–200)
HDL: 76.9 mg/dL (ref >39.00)
LDL Cholesterol: 70 mg/dL (ref 0–99)
NonHDL: 90.79
Total CHOL/HDL Ratio: 2
Triglycerides: 102 mg/dL (ref 0.0–149.0)
VLDL: 20.4 mg/dL (ref 0.0–40.0)

## 2021-04-28 LAB — TSH: TSH: 1.22 u[IU]/mL (ref 0.35–5.50)

## 2021-04-28 LAB — HEMOGLOBIN A1C: Hgb A1c MFr Bld: 5.7 % (ref 4.6–6.5)

## 2021-04-28 LAB — VITAMIN D 25 HYDROXY (VIT D DEFICIENCY, FRACTURES): VITD: 53.78 ng/mL (ref 30.00–100.00)

## 2021-05-01 ENCOUNTER — Ambulatory Visit (INDEPENDENT_AMBULATORY_CARE_PROVIDER_SITE_OTHER): Payer: BC Managed Care – PPO | Admitting: Internal Medicine

## 2021-05-01 ENCOUNTER — Encounter: Payer: Self-pay | Admitting: Internal Medicine

## 2021-05-01 ENCOUNTER — Other Ambulatory Visit: Payer: Self-pay

## 2021-05-01 VITALS — BP 142/80 | HR 93 | Temp 98.2°F | Ht 62.0 in | Wt 162.0 lb

## 2021-05-01 DIAGNOSIS — E538 Deficiency of other specified B group vitamins: Secondary | ICD-10-CM

## 2021-05-01 DIAGNOSIS — E559 Vitamin D deficiency, unspecified: Secondary | ICD-10-CM

## 2021-05-01 DIAGNOSIS — Z0001 Encounter for general adult medical examination with abnormal findings: Secondary | ICD-10-CM

## 2021-05-01 DIAGNOSIS — Z860100 Personal history of colon polyps, unspecified: Secondary | ICD-10-CM

## 2021-05-01 DIAGNOSIS — R739 Hyperglycemia, unspecified: Secondary | ICD-10-CM | POA: Diagnosis not present

## 2021-05-01 DIAGNOSIS — E785 Hyperlipidemia, unspecified: Secondary | ICD-10-CM | POA: Diagnosis not present

## 2021-05-01 DIAGNOSIS — I10 Essential (primary) hypertension: Secondary | ICD-10-CM

## 2021-05-01 NOTE — Patient Instructions (Signed)
You will be contacted regarding the referral for: colonoscopy  Please continue all other medications as before, and refills have been done if requested.  Please have the pharmacy call with any other refills you may need.  Please continue your efforts at being more active, low cholesterol diet, and weight control.  You are otherwise up to date with prevention measures today.  Please keep your appointments with your specialists as you may have planned - including for the left knee  Please make an Appointment to return for your 1 year visit, or sooner if needed, with Lab testing by Appointment as well, to be done about 3-5 days before at [facility] (so this is for TWO appointments - please see the scheduling desk as you leave)   Due to the ongoing Covid 19 pandemic, our lab now requires an appointment for any labs done at our office.  If you need labs done and do not have an appointment, please call our office ahead of time to schedule before presenting to the lab for your testing.

## 2021-05-01 NOTE — Assessment & Plan Note (Signed)

## 2021-05-01 NOTE — Assessment & Plan Note (Signed)
Lab Results  Component Value Date   HGBA1C 5.7 04/28/2021   Stable, pt to continue current medical treatment  - diet

## 2021-05-01 NOTE — Progress Notes (Signed)
Patient ID: Barbara Gross, female   DOB: July 12, 1956, 65 y.o.   MRN: [id number]         Chief Complaint:: wellness exam and hyperglycemia, hld, htn,        HPI:  Barbara Gross is a 65 y.o. female here for wellness exam; due for colonoscopy o/w up to date with preventive referrals and immunizations                        Also has known left knee baker cyst, waiting on new insuarance after 65yo for MRI and possible surgury.   Pt denies chest pain, increased sob or doe, wheezing, orthopnea, PND, increased LE swelling, palpitations, dizziness or syncope.   Pt denies polydipsia, polyuria, or new focal neuro s/s.   Pt denies fever, wt loss, night sweats, loss of appetite, or other constitutional symptoms  BP at home has been < 140/90, does not want med change today    Wt Readings from Last 3 Encounters:  05/01/21 162 lb (73.5 kg)  04/04/21 163 lb (73.9 kg)  02/21/21 164 lb (74.4 kg)   BP Readings from Last 3 Encounters:  05/01/21 (!) 142/80  04/04/21 (!) 158/90  02/21/21 110/70   Immunization History  Administered Date(s) Administered   Influenza Whole 09/03/2002   Influenza,inj,Quad PF,6+ Mos 07/16/2019   Influenza-Unspecified 07/21/2018   PFIZER Comirnaty(Gray Top)Covid-19 Tri-Sucrose Vaccine 01/09/2021   PFIZER(Purple Top)SARS-COV-2 Vaccination 12/24/2019, 01/21/2020, 08/18/2020   Td 10/02/1995, 01/03/2009   Tdap 04/28/2019   Zoster Recombinat (Shingrix) 08/28/2019, 10/05/2019   Health Maintenance Due  Topic Date Due   COLONOSCOPY (Pts 45-28yr Insurance coverage will need to be confirmed)  06/23/2019      Past Medical History:  Diagnosis Date   ABDOMINAL PAIN, LOWER 05/22/2010   Allergy    pollen    ANXIETY 12/24/2007   Arthritis    back , shoulder   BACTERIAL VAGINITIS 12/24/2007   BREAST MASS, LEFT 07/15/2008   DEPRESSION 12/24/2007   DIVERTICULITIS OF COLON 04/21/2010   History of migraine    none in years   HYPERLIPIDEMIA 05/31/2007   HYPERTENSION 05/31/2007    Hypertension    Lesion of bladder    MENOPAUSE, EARLY 01/04/2010   Microscopic hematuria 01/03/2009   PONV (postoperative nausea and vomiting)    Past Surgical History:  Procedure Laterality Date   ABDOMINAL HYSTERECTOMY  1990   COLONOSCOPY  2018   CYSTOSCOPY WITH BIOPSY N/A 03/01/2020   Procedure: CYSTOSCOPY WITH BIOPSY, FULGERATION BLADDER;  Surgeon: PRobley Fries MD;  Location: [place]  Service: Urology;  Laterality: N/A;   EXPLORATORY LAPAROTOMY  as teenager   OOPHORECTOMY Bilateral 1990   POLYPECTOMY  2018   TONSILLECTOMY  as child    reports that she has quit smoking. Her smoking use included cigarettes. She has a 15.00 pack-year smoking history. She has never used smokeless tobacco. She reports current alcohol use of about 14.0 standard drinks of alcohol per week. She reports that she does not use drugs. family history includes Cancer in an other family member; Colon cancer in her paternal grandmother and another family member; Colon polyps in her father; Diabetes in her father; Hyperlipidemia in her father and mother; Hypertension in her father and mother; Melanoma in an other family member; Stroke in an other family member; Sudden death in her mother; Ulcers in her mother. Allergies  Allergen Reactions   Codeine Nausea And Vomiting   Effexor [Venlafaxine] Nausea Only  Hydrocodone Nausea And Vomiting   Current Outpatient Medications on File Prior to Visit  Medication Sig Dispense Refill   amLODipine (NORVASC) 5 MG tablet Take 1 tablet (5 mg total) by mouth daily. 90 tablet 3   aspirin 81 MG EC tablet Take 81 mg by mouth daily.     citalopram (CELEXA) 10 MG tablet Take 1.5 tablets (15 mg total) by mouth daily. 135 tablet 3   Diclofenac Sodium (PENNSAID) 2 % SOLN Place 2 g onto the skin 2 (two) times daily. 112 g 3   gabapentin (NEURONTIN) 100 MG capsule Take 1 capsule (100 mg total) by mouth 2 (two) times daily. 180 capsule 0   gabapentin (NEURONTIN) 300  MG capsule TAKE ONE CAPSULE BY MOUTH EVERY NIGHT AT BEDTIME 90 capsule 0   losartan (COZAAR) 50 MG tablet Take 1 tablet (50 mg total) by mouth daily. 90 tablet 3   lovastatin (MEVACOR) 20 MG tablet Take 1 tablet (20 mg total) by mouth at bedtime. 90 tablet 3   tiZANidine (ZANAFLEX) 2 MG tablet Take 1 tablet (2 mg total) by mouth at bedtime. 30 tablet 0   predniSONE (DELTASONE) 20 MG tablet Take 2 tablets (40 mg total) by mouth daily with breakfast. (Patient not taking: Reported on 05/01/2021) 10 tablet 0   No current facility-administered medications on file prior to visit.        ROS:  All others reviewed and negative.  Objective        PE:  BP (!) 142/80 (BP Location: Left Arm, Patient Position: Sitting, Cuff Size: Normal)   Pulse 93   Temp 98.2 F (36.8 C) (Oral)   Ht '5\' 2"'$  (1.575 m)   Wt 162 lb (73.5 kg)   SpO2 97%   BMI 29.63 kg/m                 Constitutional: Pt appears in NAD               HENT: Head: NCAT.                Right Ear: External ear normal.                 Left Ear: External ear normal.                Eyes: . Pupils are equal, round, and reactive to light. Conjunctivae and EOM are normal               Nose: without d/c or deformity               Neck: Neck supple. Gross normal ROM               Cardiovascular: Normal rate and regular rhythm.                 Pulmonary/Chest: Effort normal and breath sounds without rales or wheezing.                Abd:  Soft, NT, ND, + BS, no organomegaly               Neurological: Pt is alert. At baseline orientation, motor grossly intact               Skin: Skin is warm. No rashes, no other new lesions, LE edema - none               Psychiatric: Pt behavior is normal without agitation   Micro: none  Cardiac tracings I have personally interpreted today:  none  Pertinent Radiological findings (summarize): none   Lab Results  Component Value Date   WBC 6.9 04/28/2021   HGB 14.7 04/28/2021   HCT 43.5 04/28/2021   PLT  307.0 04/28/2021   GLUCOSE 94 04/28/2021   CHOL 168 04/28/2021   TRIG 102.0 04/28/2021   HDL 76.90 04/28/2021   LDLCALC 70 04/28/2021   ALT 30 04/28/2021   AST 23 04/28/2021   NA 140 04/28/2021   K 4.2 04/28/2021   CL 102 04/28/2021   CREATININE 0.60 04/28/2021   BUN 12 04/28/2021   CO2 28 04/28/2021   TSH 1.22 04/28/2021   HGBA1C 5.7 04/28/2021   Assessment/Plan:  Barbara Gross is a 65 y.o. White or Caucasian [1] female with  has a past medical history of ABDOMINAL PAIN, LOWER (05/22/2010), Allergy, ANXIETY (12/24/2007), Arthritis, BACTERIAL VAGINITIS (12/24/2007), BREAST MASS, LEFT (07/15/2008), DEPRESSION (12/24/2007), DIVERTICULITIS OF COLON (04/21/2010), History of migraine, HYPERLIPIDEMIA (05/31/2007), HYPERTENSION (05/31/2007), Hypertension, Lesion of bladder, MENOPAUSE, EARLY (01/04/2010), Microscopic hematuria (01/03/2009), and PONV (postoperative nausea and vomiting).  Encounter for well adult exam with abnormal findings Age and sex appropriate education and counseling updated with regular exercise and diet Referrals for preventative services - for colonoscopy Immunizations addressed - none needed Smoking counseling  - none needed Evidence for depression or other mood disorder - none significant Most recent labs reviewed. I have personally reviewed and have noted: 1) the patient's medical and social history 2) The patient's current medications and supplements 3) The patient's height, weight, and BMI have been recorded in the chart   Hyperglycemia Lab Results  Component Value Date   HGBA1C 5.7 04/28/2021   Stable, pt to continue current medical treatment  - diet   HLD (hyperlipidemia) Lab Results  Component Value Date   LDLCALC 70 04/28/2021   Stable, pt to continue current statin lovastatin 20   Essential hypertension Uncontrolled,, pt to continue medical treatment novrasc, losartan, as she dedlines change in tx today  Followup: Return in about 1 year (around  05/01/2022).  Cathlean Cower, MD 05/01/2021 10:25 PM [facility]

## 2021-05-01 NOTE — Assessment & Plan Note (Signed)
Uncontrolled,, pt to continue medical treatment novrasc, losartan, as she dedlines change in tx today

## 2021-05-01 NOTE — Assessment & Plan Note (Signed)
Lab Results  Component Value Date   LDLCALC 70 04/28/2021   Stable, pt to continue current statin lovastatin 20

## 2021-05-17 ENCOUNTER — Encounter: Payer: Self-pay | Admitting: Family Medicine

## 2021-05-17 ENCOUNTER — Other Ambulatory Visit: Payer: Self-pay

## 2021-05-17 ENCOUNTER — Ambulatory Visit: Payer: BC Managed Care – PPO | Admitting: Family Medicine

## 2021-05-17 DIAGNOSIS — M7122 Synovial cyst of popliteal space [Baker], left knee: Secondary | ICD-10-CM

## 2021-05-17 MED ORDER — DICLOFENAC SODIUM 2 % EX SOLN: 2.0000 g | Freq: Two times a day (BID) | CUTANEOUS | 3 refills | Status: DC

## 2021-05-17 NOTE — Patient Instructions (Signed)
Good to see you  Pennsaid refilled continue using  Ice is your friend See me after the first of October and we will talk about MRI

## 2021-05-17 NOTE — Assessment & Plan Note (Signed)
Baker's cyst noted.  Patient continues to have chronic pain that seems to be out of proportion to this.  Still awaiting secondary to some social determinants of health including financial stability and insurances.  Patient would like to wait until October potentially for advanced imaging and at this moment will follow-up after this to discuss and see how patient's knee is doing.  Patient knows if worsening pain to seek medical attention immediately.

## 2021-05-17 NOTE — Progress Notes (Signed)
[place] Phone: [phone] Subjective:    I'm seeing this patient by the request  of:  Biagio Borg, MD  CC: Knee pain follow-up  [id number]  Barbara Gross is a 65 y.o. female coming in with complaint of knee pain.  Patient was last seen 6 weeks ago and was found to have a Baker's cyst of the left knee.  We discussed further work-up but there was a social determinants of health including financial as well as potential insurance changes to have kept patient from wanting to have the MRI.  Patient states that her knee has been doing well, had a couple days where the pain was so bad that the pennsaid wouldn't touch the pain.        Past Medical History:  Diagnosis Date   ABDOMINAL PAIN, LOWER 05/22/2010   Allergy    pollen    ANXIETY 12/24/2007   Arthritis    back , shoulder   BACTERIAL VAGINITIS 12/24/2007   BREAST MASS, LEFT 07/15/2008   DEPRESSION 12/24/2007   DIVERTICULITIS OF COLON 04/21/2010   History of migraine    none in years   HYPERLIPIDEMIA 05/31/2007   HYPERTENSION 05/31/2007   Hypertension    Lesion of bladder    MENOPAUSE, EARLY 01/04/2010   Microscopic hematuria 01/03/2009   PONV (postoperative nausea and vomiting)    Past Surgical History:  Procedure Laterality Date   ABDOMINAL HYSTERECTOMY  1990   COLONOSCOPY  2018   CYSTOSCOPY WITH BIOPSY N/A 03/01/2020   Procedure: CYSTOSCOPY WITH BIOPSY, FULGERATION BLADDER;  Surgeon: Robley Fries, MD;  Location: [place];  Service: Urology;  Laterality: N/A;   EXPLORATORY LAPAROTOMY  as teenager   OOPHORECTOMY Bilateral 1990   POLYPECTOMY  2018   TONSILLECTOMY  as child   Social History   Socioeconomic History   Marital status: Single    Spouse name: Not on file   Number of children: Not on file   Years of education: Not on file   Highest education level: Not on file  Occupational History   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  Tobacco Use   Smoking status: Former    Packs/day: 1.00    Years: 15.00    Pack years: 15.00    Types: Cigarettes   Smokeless tobacco: Never   Tobacco comments:    quit in the 80's   Vaping Use   Vaping Use: Never used  Substance and Sexual Activity   Alcohol use: Yes    Alcohol/week: 14.0 standard drinks    Types: 14 Glasses of wine per week    Comment: 2 glasses of wine a night   Drug use: No   Sexual activity: Not on file  Other Topics Concern   Not on file  Social History Narrative   Not on file   Social Determinants of Health   Financial Resource Strain: Not on file  Food Insecurity: Not on file  Transportation Needs: Not on file  Physical Activity: Not on file  Stress: Not on file  Social Connections: Not on file   Allergies  Allergen Reactions   Codeine Nausea And Vomiting   Effexor [Venlafaxine] Nausea Only   Hydrocodone Nausea And Vomiting   Family History  Problem Relation Age of Onset   Hypertension Mother    Ulcers Mother        bleeding   Hyperlipidemia Mother    Sudden death  Mother        bleeding ulcer 1995   Diabetes Father    Hypertension Father    Hyperlipidemia Father    Colon polyps Father    Cancer Other        prostate   Melanoma Other    Colon cancer Other        dx'd in 80's   Stroke Other    Colon cancer Paternal Grandmother    Esophageal cancer Neg Hx    Rectal cancer Neg Hx    Stomach cancer Neg Hx     Current Outpatient Medications (Endocrine & Metabolic):    predniSONE (DELTASONE) 20 MG tablet, Take 2 tablets (40 mg total) by mouth daily with breakfast.  Current Outpatient Medications (Cardiovascular):    amLODipine (NORVASC) 5 MG tablet, Take 1 tablet (5 mg total) by mouth daily.   losartan (COZAAR) 50 MG tablet, Take 1 tablet (50 mg total) by mouth daily.   lovastatin (MEVACOR) 20 MG tablet, Take 1 tablet (20 mg total) by mouth at bedtime.   Current Outpatient Medications (Analgesics):     aspirin 81 MG EC tablet, Take 81 mg by mouth daily.   Current Outpatient Medications (Other):    citalopram (CELEXA) 10 MG tablet, Take 1.5 tablets (15 mg total) by mouth daily.   gabapentin (NEURONTIN) 100 MG capsule, Take 1 capsule (100 mg total) by mouth 2 (two) times daily.   gabapentin (NEURONTIN) 300 MG capsule, TAKE ONE CAPSULE BY MOUTH EVERY NIGHT AT BEDTIME   tiZANidine (ZANAFLEX) 2 MG tablet, Take 1 tablet (2 mg total) by mouth at bedtime.   Diclofenac Sodium (PENNSAID) 2 % SOLN, Place 2 g onto the skin 2 (two) times daily.   Reviewed prior external information including notes and imaging from  primary care provider As well as notes that were available from care everywhere and other healthcare systems.  Past medical history, social, surgical and family history all reviewed in electronic medical record.  No pertanent information unless stated regarding to the chief complaint.   Review of Systems:  No headache, visual changes, nausea, vomiting, diarrhea, constipation, dizziness, abdominal pain, skin rash, fevers, chills, night sweats, weight loss, swollen lymph nodes, body aches, joint swelling, chest pain, shortness of breath, mood changes. POSITIVE muscle aches  Objective  Blood pressure 130/90, pulse 77, height '5\' 2"'$  (1.575 m), weight 160 lb (72.6 kg), SpO2 98 %.   General: No apparent distress alert and oriented x3 mood and affect normal, dressed appropriately.  HEENT: Pupils equal, extraocular movements intact  Respiratory: Patient's speak in full sentences and does not appear short of breath  Cardiovascular: No lower extremity edema, non tender, no erythema  Gait normal with good balance and coordination.  MSK: Left knee exam shows patient has trace fluid noted of the of the patellofemoral joint noted.  Still some mild tenderness noted over the medial joint line.  Positive McMurray's with some mild instability noted.  No significant reaccumulation of the Baker's cyst of  felt in the popliteal area.    Impression and Recommendations:     The above documentation has been reviewed and is accurate and complete Lyndal Pulley, DO

## 2021-05-18 ENCOUNTER — Encounter: Payer: Self-pay | Admitting: Internal Medicine

## 2021-05-22 ENCOUNTER — Other Ambulatory Visit: Payer: Self-pay | Admitting: Internal Medicine

## 2021-05-22 NOTE — Telephone Encounter (Signed)
Please refill as per office routine med refill policy (all routine meds refilled for 3 mo or monthly per pt preference up to one year from last visit, then month to month grace period for 3 mo, then further med refills will have to be denied)  

## 2021-06-29 NOTE — Progress Notes (Signed)
[place] Phone: [phone] Subjective:   [name], am serving as a scribe for Dr. Hulan Saas. This visit occurred during the SARS-CoV-2 public health emergency.  Safety protocols were in place, including screening questions prior to the visit, additional usage of staff PPE, and extensive cleaning of exam room while observing appropriate contact time as indicated for disinfecting solutions.   I'm seeing this patient by the request  of:  Biagio Borg, MD  CC: Left knee pain  [id number]  05/17/2021 Baker's cyst noted.  Patient continues to have chronic pain that seems to be out of proportion to this.  Still awaiting secondary to some social determinants of health including financial stability and insurances.  Patient would like to wait until October potentially for advanced imaging and at this moment will follow-up after this to discuss and see how patient's knee is doing.  Patient knows if worsening pain to seek medical attention immediately.  Updated 07/04/2021 CUBA NATARAJAN is a 65 y.o. female coming in with complaint of left knee pain. Pain is no better but Pennsaid is helpful.  Unfortunately this is only for a SMALL amount of time.  Does have increasing instability.  Patient has been seen previously and has had injections as well as aspiration of Baker's cyst.  Patient states that it is affecting daily activities and giving her more and more instability where patient is concerned that she is going to fall.    Past Medical History:  Diagnosis Date   ABDOMINAL PAIN, LOWER 05/22/2010   Allergy    pollen    ANXIETY 12/24/2007   Arthritis    back , shoulder   BACTERIAL VAGINITIS 12/24/2007   BREAST MASS, LEFT 07/15/2008   DEPRESSION 12/24/2007   DIVERTICULITIS OF COLON 04/21/2010   History of migraine    none in years   HYPERLIPIDEMIA 05/31/2007   HYPERTENSION 05/31/2007   Hypertension    Lesion of  bladder    MENOPAUSE, EARLY 01/04/2010   Microscopic hematuria 01/03/2009   PONV (postoperative nausea and vomiting)    Past Surgical History:  Procedure Laterality Date   ABDOMINAL HYSTERECTOMY  1990   COLONOSCOPY  2018   CYSTOSCOPY WITH BIOPSY N/A 03/01/2020   Procedure: CYSTOSCOPY WITH BIOPSY, FULGERATION BLADDER;  Surgeon: Robley Fries, MD;  Location: [place];  Service: Urology;  Laterality: N/A;   EXPLORATORY LAPAROTOMY  as teenager   OOPHORECTOMY Bilateral 1990   POLYPECTOMY  2018   TONSILLECTOMY  as child   Social History   Socioeconomic History   Marital status: Single    Spouse name: Not on file   Number of children: Not on file   Years of education: Not on file   Highest education level: Not on file  Occupational History   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  Tobacco Use   Smoking status: Former    Packs/day: 1.00    Years: 15.00    Pack years: 15.00    Types: Cigarettes   Smokeless tobacco: Never   Tobacco comments:    quit in the 80's   Vaping Use   Vaping Use: Never used  Substance and Sexual Activity   Alcohol use: Yes    Alcohol/week: 14.0 standard drinks    Types: 14 Glasses of wine per week    Comment: 2 glasses of wine a night   Drug use: No   Sexual activity: Not on file  Other Topics Concern   Not on file  Social History Narrative   Not on file   Social Determinants of Health   Financial Resource Strain: Not on file  Food Insecurity: Not on file  Transportation Needs: Not on file  Physical Activity: Not on file  Stress: Not on file  Social Connections: Not on file   Allergies  Allergen Reactions   Codeine Nausea And Vomiting   Effexor [Venlafaxine] Nausea Only   Hydrocodone Nausea And Vomiting   Family History  Problem Relation Age of Onset   Hypertension Mother    Ulcers Mother        bleeding   Hyperlipidemia Mother    Sudden death Mother        bleeding ulcer 1995   Diabetes Father     Hypertension Father    Hyperlipidemia Father    Colon polyps Father    Cancer Other        prostate   Melanoma Other    Colon cancer Other        dx'd in 24's   Stroke Other    Colon cancer Paternal Grandmother    Esophageal cancer Neg Hx    Rectal cancer Neg Hx    Stomach cancer Neg Hx     Current Outpatient Medications (Endocrine & Metabolic):    predniSONE (DELTASONE) 20 MG tablet, Take 2 tablets (40 mg total) by mouth daily with breakfast.  Current Outpatient Medications (Cardiovascular):    amLODipine (NORVASC) 5 MG tablet, TAKE ONE TABLET BY MOUTH DAILY   losartan (COZAAR) 50 MG tablet, TAKE ONE TABLET BY MOUTH DAILY   lovastatin (MEVACOR) 20 MG tablet, TAKE ONE TABLET BY MOUTH AT BEDTIME   Current Outpatient Medications (Analgesics):    aspirin 81 MG EC tablet, Take 81 mg by mouth daily.   Current Outpatient Medications (Other):    citalopram (CELEXA) 10 MG tablet, Take 1.5 tablets (15 mg total) by mouth daily.   Diclofenac Sodium (PENNSAID) 2 % SOLN, Place 2 g onto the skin 2 (two) times daily.   gabapentin (NEURONTIN) 100 MG capsule, Take 1 capsule (100 mg total) by mouth 2 (two) times daily.   gabapentin (NEURONTIN) 300 MG capsule, TAKE ONE CAPSULE BY MOUTH EVERY NIGHT AT BEDTIME   tiZANidine (ZANAFLEX) 2 MG tablet, Take 1 tablet (2 mg total) by mouth at bedtime.   Reviewed prior external information including notes and imaging from  primary care provider As well as notes that were available from care everywhere and other healthcare systems.  Past medical history, social, surgical and family history all reviewed in electronic medical record.  No pertanent information unless stated regarding to the chief complaint.   Review of Systems:  No headache, visual changes, nausea, vomiting, diarrhea, constipation, dizziness, abdominal pain, skin rash, fevers, chills, night sweats, weight loss, swollen lymph nodes, body aches, joint swelling, chest pain, shortness of  breath, mood changes. POSITIVE muscle aches  Objective  Blood pressure (!) 136/98, pulse 98, height 5\' 2"  (1.575 m), weight 163 lb (73.9 kg), SpO2 98 %.   General: No apparent distress alert and oriented x3 mood and affect normal, dressed appropriately.  HEENT: Pupils equal, extraocular movements intact  Respiratory: Patient's speak in full sentences and does not appear short of breath  Cardiovascular: No lower extremity edema, non tender, no erythema  Gait antalgic gait Left knee exam shows the patient does have tenderness to palpation diffusely.  Trace effusion noted of the patellofemoral.  Patient does have positive  McMurray's sign.  Tender over the medial joint line and the patellofemoral joint.  Limited muscular skeletal ultrasound was performed and interpreted by Hulan Saas, M  Limited ultrasound shows the patient does have effusion noted the patellofemoral joint.  Patient also has abnormality noted of the synovial lining that does have abnormality of calcific findings that can be consistent with potential gout but also PVNS.  Patient does have a medial meniscal degenerative changes noted with some displacement.  Approximately 25% displaced. Impression: Abnormality of the knee with worsening symptoms.    Impression and Recommendations:     The above documentation has been reviewed and is accurate and complete Lyndal Pulley, DO

## 2021-07-04 ENCOUNTER — Ambulatory Visit (INDEPENDENT_AMBULATORY_CARE_PROVIDER_SITE_OTHER): Payer: Medicare Other | Admitting: Family Medicine

## 2021-07-04 ENCOUNTER — Other Ambulatory Visit: Payer: Self-pay

## 2021-07-04 ENCOUNTER — Encounter: Payer: Self-pay | Admitting: Family Medicine

## 2021-07-04 ENCOUNTER — Ambulatory Visit: Payer: Self-pay

## 2021-07-04 VITALS — BP 136/98 | HR 98 | Ht 62.0 in | Wt 163.0 lb

## 2021-07-04 DIAGNOSIS — M25562 Pain in left knee: Secondary | ICD-10-CM

## 2021-07-04 DIAGNOSIS — G8929 Other chronic pain: Secondary | ICD-10-CM

## 2021-07-04 NOTE — Patient Instructions (Signed)
MRI L knee [facility] [phone]

## 2021-07-04 NOTE — Assessment & Plan Note (Signed)
Patient has had left knee pain for quite some time.  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  Enlargement of the Baker's cyst.  Concern for a possible deep meniscal tear causing more instability.  We will need to get MRI to further evaluate and patient would be a candidate for surgical intervention with patient already failing all conservative therapies including medications, steroid injections, viscosupplementation and physical therapy.

## 2021-07-06 ENCOUNTER — Other Ambulatory Visit: Payer: Self-pay

## 2021-07-06 ENCOUNTER — Ambulatory Visit
Admission: RE | Admit: 2021-07-06 | Discharge: 2021-07-06 | Disposition: A | Discharge status: Home / Self Care | Payer: Medicare Other | Source: Ambulatory Visit | Attending: Family Medicine | Admitting: Family Medicine

## 2021-07-06 DIAGNOSIS — G8929 Other chronic pain: Secondary | ICD-10-CM

## 2021-07-06 DIAGNOSIS — M25562 Pain in left knee: Secondary | ICD-10-CM

## 2021-07-07 ENCOUNTER — Encounter: Payer: Self-pay | Admitting: Family Medicine

## 2021-07-10 ENCOUNTER — Other Ambulatory Visit: Payer: Self-pay

## 2021-07-10 DIAGNOSIS — G8929 Other chronic pain: Secondary | ICD-10-CM

## 2021-07-10 DIAGNOSIS — M25562 Pain in left knee: Secondary | ICD-10-CM

## 2021-07-12 ENCOUNTER — Encounter: Payer: Self-pay | Admitting: Internal Medicine

## 2021-07-18 DIAGNOSIS — M1712 Unilateral primary osteoarthritis, left knee: Secondary | ICD-10-CM | POA: Diagnosis not present

## 2021-07-21 ENCOUNTER — Other Ambulatory Visit: Payer: Self-pay

## 2021-07-21 ENCOUNTER — Other Ambulatory Visit: Payer: Self-pay | Admitting: Family Medicine

## 2021-07-26 ENCOUNTER — Encounter: Payer: Self-pay | Admitting: Internal Medicine

## 2021-07-26 ENCOUNTER — Ambulatory Visit (INDEPENDENT_AMBULATORY_CARE_PROVIDER_SITE_OTHER): Payer: Medicare Other | Admitting: Internal Medicine

## 2021-07-26 ENCOUNTER — Other Ambulatory Visit: Payer: Self-pay

## 2021-07-26 VITALS — BP 138/80 | HR 87 | Ht 62.0 in | Wt 165.0 lb

## 2021-07-26 DIAGNOSIS — Z01818 Encounter for other preprocedural examination: Secondary | ICD-10-CM

## 2021-07-26 DIAGNOSIS — Z23 Encounter for immunization: Secondary | ICD-10-CM

## 2021-07-26 DIAGNOSIS — M545 Low back pain, unspecified: Secondary | ICD-10-CM | POA: Diagnosis not present

## 2021-07-26 DIAGNOSIS — I10 Essential (primary) hypertension: Secondary | ICD-10-CM

## 2021-07-26 DIAGNOSIS — G8929 Other chronic pain: Secondary | ICD-10-CM

## 2021-07-26 DIAGNOSIS — E7800 Pure hypercholesterolemia, unspecified: Secondary | ICD-10-CM | POA: Diagnosis not present

## 2021-07-26 DIAGNOSIS — R739 Hyperglycemia, unspecified: Secondary | ICD-10-CM

## 2021-07-26 MED ORDER — CYCLOBENZAPRINE HCL 5 MG PO TABS: 5.0000 mg | ORAL_TABLET | Freq: Three times a day (TID) | ORAL | 1 refills | Status: DC | PRN

## 2021-07-26 NOTE — Patient Instructions (Signed)
Please take all new medication as prescribed  - the muscle relaxer as needed  Please continue all other medications as before, and refills have been done if requested.  Please have the pharmacy call with any other refills you may need.  Please continue your efforts at being more active, low cholesterol diet, and weight control.  Please keep your appointments with your specialists as you may have planned  Good Luck with your Left knee surgury!

## 2021-07-26 NOTE — Progress Notes (Signed)
Patient ID: Barbara Gross, female   DOB: 1955-11-13, 65 y.o.   MRN: [id number]        Chief Complaint: follow up preop left knee TKR, right lower back pain, hld, htn, hyperglycemia       HPI:  Barbara Gross is a 65 y.o. female here overall doing ok.  Pt denies chest pain, increased sob or doe, wheezing, orthopnea, PND, increased LE swelling, palpitations, dizziness or syncope.   Pt denies polydipsia, polyuria, or new focal neuro s/s.   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  Also, Pt continues to have 1 wk worsening right LBP without change in severity, bowel or bladder change, fever, wt loss,  worsening LE pain/numbness/weakness, gait change or falls, but sore on that side to touch.  She plans for colonoscopy for 2023 to get past the knee surgury and rehab Wt Readings from Last 3 Encounters:  07/26/21 165 lb (74.8 kg)  07/04/21 163 lb (73.9 kg)  05/17/21 160 lb (72.6 kg)   BP Readings from Last 3 Encounters:  07/26/21 138/80  07/04/21 (!) 136/98  05/17/21 130/90         Past Medical History:  Diagnosis Date   ABDOMINAL PAIN, LOWER 05/22/2010   Allergy    pollen    ANXIETY 12/24/2007   Arthritis    back , shoulder   BACTERIAL VAGINITIS 12/24/2007   BREAST MASS, LEFT 07/15/2008   DEPRESSION 12/24/2007   DIVERTICULITIS OF COLON 04/21/2010   History of migraine    none in years   HYPERLIPIDEMIA 05/31/2007   HYPERTENSION 05/31/2007   Hypertension    Lesion of bladder    MENOPAUSE, EARLY 01/04/2010   Microscopic hematuria 01/03/2009   PONV (postoperative nausea and vomiting)    Past Surgical History:  Procedure Laterality Date   ABDOMINAL HYSTERECTOMY  1990   COLONOSCOPY  2018   CYSTOSCOPY WITH BIOPSY N/A 03/01/2020   Procedure: CYSTOSCOPY WITH BIOPSY, FULGERATION BLADDER;  Surgeon: Robley Fries, MD;  Location: [facility];  Service: Urology;  Laterality: N/A;   EXPLORATORY LAPAROTOMY  as teenager   OOPHORECTOMY Bilateral 1990   POLYPECTOMY  2018   TONSILLECTOMY  as child    reports that she has quit smoking. Her smoking use included cigarettes. She has a 15.00 pack-year smoking history. She has never used smokeless tobacco. She reports current alcohol use of about 14.0 standard drinks per week. She reports that she does not use drugs. family history includes Cancer in an other family member; Colon cancer in her paternal grandmother and another family member; Colon polyps in her father; Diabetes in her father; Hyperlipidemia in her father and mother; Hypertension in her father and mother; Melanoma in an other family member; Stroke in an other family member; Sudden death in her mother; Ulcers in her mother. Allergies  Allergen Reactions   Codeine Nausea And Vomiting   Effexor [Venlafaxine] Nausea Only   Hydrocodone Nausea And Vomiting   Current Outpatient Medications on File Prior to Visit  Medication Sig Dispense Refill   amLODipine (NORVASC) 5 MG tablet TAKE ONE TABLET BY MOUTH DAILY 90 tablet 3   aspirin 81 MG EC tablet Take 81 mg by mouth daily.     citalopram (CELEXA) 10 MG tablet Take 1.5 tablets (15 mg  total) by mouth daily. 135 tablet 3   Diclofenac Sodium (PENNSAID) 2 % SOLN Place 2 g onto the skin 2 (two) times daily. 112 g 3   gabapentin (NEURONTIN) 100 MG capsule Take 1 capsule (100 mg total) by mouth 2 (two) times daily. 180 capsule 0   gabapentin (NEURONTIN) 300 MG capsule TAKE ONE CAPSULE BY MOUTH EVERY NIGHT AT BEDTIME 90 capsule 0   losartan (COZAAR) 50 MG tablet TAKE ONE TABLET BY MOUTH DAILY 90 tablet 3   lovastatin (MEVACOR) 20 MG tablet TAKE ONE TABLET BY MOUTH AT BEDTIME 90 tablet 3   predniSONE (DELTASONE) 20 MG tablet Take 2 tablets (40 mg total) by mouth daily with breakfast. 10 tablet 0   tiZANidine (ZANAFLEX) 2 MG tablet Take 1  tablet (2 mg total) by mouth at bedtime. 30 tablet 0   No current facility-administered medications on file prior to visit.        ROS:  All others reviewed and negative.  Objective        PE:  BP 138/80 (BP Location: Right Arm, Patient Position: Sitting, Cuff Size: Large)   Pulse 87   Ht 5\' 2"  (1.575 m)   Wt 165 lb (74.8 kg)   SpO2 100%   BMI 30.18 kg/m                 Constitutional: Pt appears in NAD               HENT: Head: NCAT.                Right Ear: External ear normal.                 Left Ear: External ear normal.                Eyes: . Pupils are equal, round, and reactive to light. Conjunctivae and EOM are normal               Nose: without d/c or deformity               Neck: Neck supple. Gross normal ROM               Cardiovascular: Normal rate and regular rhythm.                 Pulmonary/Chest: Effort normal and breath sounds without rales or wheezing.                Abd:  Soft, NT, ND, + BS, no organomegaly               Neurological: Pt is alert. At baseline orientation, motor grossly intact               Has right lumbar paravertebral tender spasm               Skin: Skin is warm. No rashes, no other new lesions, LE edema - none               Psychiatric: Pt behavior is normal without agitation   Micro: none  Cardiac tracings I have personally interpreted today:  none  Pertinent Radiological findings (summarize): none   Lab Results  Component Value Date   WBC 6.9 04/28/2021   HGB 14.7 04/28/2021   HCT 43.5 04/28/2021   PLT 307.0 04/28/2021   GLUCOSE 94 04/28/2021   CHOL 168 04/28/2021   TRIG 102.0 04/28/2021   HDL 76.90 04/28/2021  LDLCALC 70 04/28/2021   ALT 30 04/28/2021   AST 23 04/28/2021   NA 140 04/28/2021   K 4.2 04/28/2021   CL 102 04/28/2021   CREATININE 0.60 04/28/2021   BUN 12 04/28/2021   CO2 28 04/28/2021   TSH 1.22 04/28/2021   HGBA1C 5.7 04/28/2021   Assessment/Plan:  Barbara Gross is a 65 y.o. White or Caucasian  [1] female with  has a past medical history of ABDOMINAL PAIN, LOWER (05/22/2010), Allergy, ANXIETY (12/24/2007), Arthritis, BACTERIAL VAGINITIS (12/24/2007), BREAST MASS, LEFT (07/15/2008), DEPRESSION (12/24/2007), DIVERTICULITIS OF COLON (04/21/2010), History of migraine, HYPERLIPIDEMIA (05/31/2007), HYPERTENSION (05/31/2007), Hypertension, Lesion of bladder, MENOPAUSE, EARLY (01/04/2010), Microscopic hematuria (01/03/2009), and PONV (postoperative nausea and vomiting).  HLD (hyperlipidemia) Lab Results  Component Value Date   LDLCALC 70 04/28/2021   Stable, pt to continue current statin lovasatatin 20   Essential hypertension BP Readings from Last 3 Encounters:  07/26/21 138/80  07/04/21 (!) 136/98  05/17/21 130/90   Stable, pt to continue medical treatment norvasc, losartan   Hyperglycemia Lab Results  Component Value Date   HGBA1C 5.7 04/28/2021   Stable, pt to continue current medical treatment  - diet   Right-sided low back pain without sciatica With msk strain- for flexeril prn, declins PT eval  Preop exam for internal medicine Ok for surgury as planned,  to f/u any worsening symptoms or concerns  Followup: Return if symptoms worsen or fail to improve.  Cathlean Cower, MD 08/01/2021 10:17 PM [facility]

## 2021-08-01 DIAGNOSIS — Z01818 Encounter for other preprocedural examination: Secondary | ICD-10-CM | POA: Insufficient documentation

## 2021-08-01 HISTORY — PX: REPLACEMENT TOTAL KNEE: SUR1224

## 2021-08-01 NOTE — Assessment & Plan Note (Signed)
Lab Results  Component Value Date   LDLCALC 70 04/28/2021   Stable, pt to continue current statin lovasatatin 20

## 2021-08-01 NOTE — Assessment & Plan Note (Signed)
With msk strain- for flexeril prn, declins PT eval

## 2021-08-01 NOTE — Assessment & Plan Note (Signed)
Lab Results  Component Value Date   HGBA1C 5.7 04/28/2021   Stable, pt to continue current medical treatment  - diet

## 2021-08-01 NOTE — Assessment & Plan Note (Signed)
[place] for [employer] as planned,  to f/u any worsening symptoms or concerns

## 2021-08-01 NOTE — Assessment & Plan Note (Signed)
BP Readings from Last 3 Encounters:  07/26/21 138/80  07/04/21 (!) 136/98  05/17/21 130/90   Stable, pt to continue medical treatment norvasc, losartan

## 2021-08-07 ENCOUNTER — Telehealth: Payer: Self-pay | Admitting: Internal Medicine

## 2021-08-07 NOTE — Telephone Encounter (Signed)
Barbara Gross w/ [facility] requesting a call back to discuss lab work for patient's surgical clearance  Phone [phone]

## 2021-08-08 NOTE — Telephone Encounter (Signed)
Tried calling Barbara Gross, unable to reach or leave message

## 2021-08-11 DIAGNOSIS — M1712 Unilateral primary osteoarthritis, left knee: Secondary | ICD-10-CM | POA: Diagnosis not present

## 2021-08-15 DIAGNOSIS — M1712 Unilateral primary osteoarthritis, left knee: Secondary | ICD-10-CM | POA: Diagnosis not present

## 2021-08-20 ENCOUNTER — Other Ambulatory Visit: Payer: Self-pay | Admitting: Internal Medicine

## 2021-08-20 ENCOUNTER — Other Ambulatory Visit: Payer: Self-pay | Admitting: Family Medicine

## 2021-08-28 DIAGNOSIS — Z01812 Encounter for preprocedural laboratory examination: Secondary | ICD-10-CM | POA: Diagnosis not present

## 2021-08-28 NOTE — Telephone Encounter (Signed)
Labs faxed

## 2021-08-28 NOTE — Telephone Encounter (Signed)
Barbara Gross has called and states that fax of labs was not received. States that it was cut off. Please refax. States if not received by 4pom, appt. Will be canceled    Fax #- [phone]  Callbck #- [phone]

## 2021-08-29 ENCOUNTER — Ambulatory Visit: Payer: Medicare Other | Admitting: Internal Medicine

## 2021-08-30 DIAGNOSIS — M1712 Unilateral primary osteoarthritis, left knee: Secondary | ICD-10-CM | POA: Diagnosis not present

## 2021-08-30 DIAGNOSIS — Z96652 Presence of left artificial knee joint: Secondary | ICD-10-CM | POA: Diagnosis not present

## 2021-08-30 DIAGNOSIS — G8918 Other acute postprocedural pain: Secondary | ICD-10-CM | POA: Diagnosis not present

## 2021-09-01 DIAGNOSIS — M1712 Unilateral primary osteoarthritis, left knee: Secondary | ICD-10-CM | POA: Diagnosis not present

## 2021-09-01 DIAGNOSIS — Z96652 Presence of left artificial knee joint: Secondary | ICD-10-CM | POA: Diagnosis not present

## 2021-09-04 DIAGNOSIS — Z96652 Presence of left artificial knee joint: Secondary | ICD-10-CM | POA: Diagnosis not present

## 2021-09-04 DIAGNOSIS — Z79891 Long term (current) use of opiate analgesic: Secondary | ICD-10-CM | POA: Diagnosis not present

## 2021-09-04 DIAGNOSIS — T8131XD Disruption of external operation (surgical) wound, not elsewhere classified, subsequent encounter: Secondary | ICD-10-CM | POA: Diagnosis not present

## 2021-09-04 DIAGNOSIS — Z7982 Long term (current) use of aspirin: Secondary | ICD-10-CM | POA: Diagnosis not present

## 2021-09-07 DIAGNOSIS — M1712 Unilateral primary osteoarthritis, left knee: Secondary | ICD-10-CM | POA: Diagnosis not present

## 2021-09-12 DIAGNOSIS — Z9889 Other specified postprocedural states: Secondary | ICD-10-CM | POA: Diagnosis not present

## 2021-09-12 DIAGNOSIS — R531 Weakness: Secondary | ICD-10-CM | POA: Diagnosis not present

## 2021-09-12 DIAGNOSIS — M25662 Stiffness of left knee, not elsewhere classified: Secondary | ICD-10-CM | POA: Diagnosis not present

## 2021-09-12 DIAGNOSIS — Z96652 Presence of left artificial knee joint: Secondary | ICD-10-CM | POA: Diagnosis not present

## 2021-09-15 DIAGNOSIS — R531 Weakness: Secondary | ICD-10-CM | POA: Diagnosis not present

## 2021-09-15 DIAGNOSIS — M25662 Stiffness of left knee, not elsewhere classified: Secondary | ICD-10-CM | POA: Diagnosis not present

## 2021-09-15 DIAGNOSIS — Z9889 Other specified postprocedural states: Secondary | ICD-10-CM | POA: Diagnosis not present

## 2021-09-15 DIAGNOSIS — Z96652 Presence of left artificial knee joint: Secondary | ICD-10-CM | POA: Diagnosis not present

## 2021-09-18 DIAGNOSIS — Z96652 Presence of left artificial knee joint: Secondary | ICD-10-CM | POA: Diagnosis not present

## 2021-09-18 DIAGNOSIS — R531 Weakness: Secondary | ICD-10-CM | POA: Diagnosis not present

## 2021-09-18 DIAGNOSIS — Z9889 Other specified postprocedural states: Secondary | ICD-10-CM | POA: Diagnosis not present

## 2021-09-18 DIAGNOSIS — M25662 Stiffness of left knee, not elsewhere classified: Secondary | ICD-10-CM | POA: Diagnosis not present

## 2021-09-20 DIAGNOSIS — Z96652 Presence of left artificial knee joint: Secondary | ICD-10-CM | POA: Diagnosis not present

## 2021-09-20 DIAGNOSIS — Z9889 Other specified postprocedural states: Secondary | ICD-10-CM | POA: Diagnosis not present

## 2021-09-20 DIAGNOSIS — M25662 Stiffness of left knee, not elsewhere classified: Secondary | ICD-10-CM | POA: Diagnosis not present

## 2021-09-20 DIAGNOSIS — R531 Weakness: Secondary | ICD-10-CM | POA: Diagnosis not present

## 2021-09-27 DIAGNOSIS — Z96652 Presence of left artificial knee joint: Secondary | ICD-10-CM | POA: Diagnosis not present

## 2021-09-27 DIAGNOSIS — M25662 Stiffness of left knee, not elsewhere classified: Secondary | ICD-10-CM | POA: Diagnosis not present

## 2021-09-27 DIAGNOSIS — Z9889 Other specified postprocedural states: Secondary | ICD-10-CM | POA: Diagnosis not present

## 2021-09-27 DIAGNOSIS — R531 Weakness: Secondary | ICD-10-CM | POA: Diagnosis not present

## 2021-09-29 DIAGNOSIS — R531 Weakness: Secondary | ICD-10-CM | POA: Diagnosis not present

## 2021-09-29 DIAGNOSIS — Z9889 Other specified postprocedural states: Secondary | ICD-10-CM | POA: Diagnosis not present

## 2021-09-29 DIAGNOSIS — Z96652 Presence of left artificial knee joint: Secondary | ICD-10-CM | POA: Diagnosis not present

## 2021-09-29 DIAGNOSIS — M25662 Stiffness of left knee, not elsewhere classified: Secondary | ICD-10-CM | POA: Diagnosis not present

## 2021-10-04 DIAGNOSIS — R531 Weakness: Secondary | ICD-10-CM | POA: Diagnosis not present

## 2021-10-04 DIAGNOSIS — Z9889 Other specified postprocedural states: Secondary | ICD-10-CM | POA: Diagnosis not present

## 2021-10-04 DIAGNOSIS — M25662 Stiffness of left knee, not elsewhere classified: Secondary | ICD-10-CM | POA: Diagnosis not present

## 2021-10-04 DIAGNOSIS — Z96652 Presence of left artificial knee joint: Secondary | ICD-10-CM | POA: Diagnosis not present

## 2021-10-06 DIAGNOSIS — Z9889 Other specified postprocedural states: Secondary | ICD-10-CM | POA: Diagnosis not present

## 2021-10-06 DIAGNOSIS — R531 Weakness: Secondary | ICD-10-CM | POA: Diagnosis not present

## 2021-10-06 DIAGNOSIS — M25662 Stiffness of left knee, not elsewhere classified: Secondary | ICD-10-CM | POA: Diagnosis not present

## 2021-10-06 DIAGNOSIS — Z96652 Presence of left artificial knee joint: Secondary | ICD-10-CM | POA: Diagnosis not present

## 2021-10-10 DIAGNOSIS — Z9889 Other specified postprocedural states: Secondary | ICD-10-CM | POA: Diagnosis not present

## 2021-10-10 DIAGNOSIS — Z96652 Presence of left artificial knee joint: Secondary | ICD-10-CM | POA: Diagnosis not present

## 2021-10-10 DIAGNOSIS — R531 Weakness: Secondary | ICD-10-CM | POA: Diagnosis not present

## 2021-10-10 DIAGNOSIS — M25662 Stiffness of left knee, not elsewhere classified: Secondary | ICD-10-CM | POA: Diagnosis not present

## 2021-10-12 DIAGNOSIS — M25662 Stiffness of left knee, not elsewhere classified: Secondary | ICD-10-CM | POA: Diagnosis not present

## 2021-10-12 DIAGNOSIS — R531 Weakness: Secondary | ICD-10-CM | POA: Diagnosis not present

## 2021-10-12 DIAGNOSIS — Z96652 Presence of left artificial knee joint: Secondary | ICD-10-CM | POA: Diagnosis not present

## 2021-10-12 DIAGNOSIS — Z9889 Other specified postprocedural states: Secondary | ICD-10-CM | POA: Diagnosis not present

## 2021-10-16 ENCOUNTER — Other Ambulatory Visit: Payer: Self-pay | Admitting: Family Medicine

## 2021-10-17 DIAGNOSIS — M25662 Stiffness of left knee, not elsewhere classified: Secondary | ICD-10-CM | POA: Diagnosis not present

## 2021-10-17 DIAGNOSIS — Z96652 Presence of left artificial knee joint: Secondary | ICD-10-CM | POA: Diagnosis not present

## 2021-10-17 DIAGNOSIS — Z9889 Other specified postprocedural states: Secondary | ICD-10-CM | POA: Diagnosis not present

## 2021-10-17 DIAGNOSIS — R531 Weakness: Secondary | ICD-10-CM | POA: Diagnosis not present

## 2021-10-19 DIAGNOSIS — R531 Weakness: Secondary | ICD-10-CM | POA: Diagnosis not present

## 2021-10-19 DIAGNOSIS — Z96652 Presence of left artificial knee joint: Secondary | ICD-10-CM | POA: Diagnosis not present

## 2021-10-19 DIAGNOSIS — M25662 Stiffness of left knee, not elsewhere classified: Secondary | ICD-10-CM | POA: Diagnosis not present

## 2021-10-19 DIAGNOSIS — Z9889 Other specified postprocedural states: Secondary | ICD-10-CM | POA: Diagnosis not present

## 2021-10-26 DIAGNOSIS — M25662 Stiffness of left knee, not elsewhere classified: Secondary | ICD-10-CM | POA: Diagnosis not present

## 2021-10-26 DIAGNOSIS — Z96652 Presence of left artificial knee joint: Secondary | ICD-10-CM | POA: Diagnosis not present

## 2021-10-26 DIAGNOSIS — R531 Weakness: Secondary | ICD-10-CM | POA: Diagnosis not present

## 2021-10-26 DIAGNOSIS — Z9889 Other specified postprocedural states: Secondary | ICD-10-CM | POA: Diagnosis not present

## 2021-10-30 DIAGNOSIS — M25662 Stiffness of left knee, not elsewhere classified: Secondary | ICD-10-CM | POA: Diagnosis not present

## 2021-10-30 DIAGNOSIS — Z96652 Presence of left artificial knee joint: Secondary | ICD-10-CM | POA: Diagnosis not present

## 2021-10-30 DIAGNOSIS — R531 Weakness: Secondary | ICD-10-CM | POA: Diagnosis not present

## 2021-10-30 DIAGNOSIS — Z9889 Other specified postprocedural states: Secondary | ICD-10-CM | POA: Diagnosis not present

## 2021-11-02 DIAGNOSIS — Z9889 Other specified postprocedural states: Secondary | ICD-10-CM | POA: Diagnosis not present

## 2021-11-02 DIAGNOSIS — Z96652 Presence of left artificial knee joint: Secondary | ICD-10-CM | POA: Diagnosis not present

## 2021-11-02 DIAGNOSIS — M25662 Stiffness of left knee, not elsewhere classified: Secondary | ICD-10-CM | POA: Diagnosis not present

## 2021-11-02 DIAGNOSIS — R531 Weakness: Secondary | ICD-10-CM | POA: Diagnosis not present

## 2021-11-06 DIAGNOSIS — M25662 Stiffness of left knee, not elsewhere classified: Secondary | ICD-10-CM | POA: Diagnosis not present

## 2021-11-06 DIAGNOSIS — Z96652 Presence of left artificial knee joint: Secondary | ICD-10-CM | POA: Diagnosis not present

## 2021-11-06 DIAGNOSIS — R531 Weakness: Secondary | ICD-10-CM | POA: Diagnosis not present

## 2021-11-06 DIAGNOSIS — Z9889 Other specified postprocedural states: Secondary | ICD-10-CM | POA: Diagnosis not present

## 2021-11-07 ENCOUNTER — Encounter: Payer: Self-pay | Admitting: Internal Medicine

## 2021-11-07 NOTE — Telephone Encounter (Signed)
Called patient , appt schedule for 11/10/21 with Dr. Jenny Reichmann

## 2021-11-08 DIAGNOSIS — R531 Weakness: Secondary | ICD-10-CM | POA: Diagnosis not present

## 2021-11-08 DIAGNOSIS — M25662 Stiffness of left knee, not elsewhere classified: Secondary | ICD-10-CM | POA: Diagnosis not present

## 2021-11-08 DIAGNOSIS — Z9889 Other specified postprocedural states: Secondary | ICD-10-CM | POA: Diagnosis not present

## 2021-11-08 DIAGNOSIS — Z96652 Presence of left artificial knee joint: Secondary | ICD-10-CM | POA: Diagnosis not present

## 2021-11-10 ENCOUNTER — Ambulatory Visit: Payer: Medicare Other | Admitting: Internal Medicine

## 2021-11-13 DIAGNOSIS — M25662 Stiffness of left knee, not elsewhere classified: Secondary | ICD-10-CM | POA: Diagnosis not present

## 2021-11-13 DIAGNOSIS — Z96652 Presence of left artificial knee joint: Secondary | ICD-10-CM | POA: Diagnosis not present

## 2021-11-13 DIAGNOSIS — R531 Weakness: Secondary | ICD-10-CM | POA: Diagnosis not present

## 2021-11-13 DIAGNOSIS — Z9889 Other specified postprocedural states: Secondary | ICD-10-CM | POA: Diagnosis not present

## 2021-11-14 DIAGNOSIS — Z9889 Other specified postprocedural states: Secondary | ICD-10-CM | POA: Diagnosis not present

## 2021-11-14 DIAGNOSIS — M1712 Unilateral primary osteoarthritis, left knee: Secondary | ICD-10-CM | POA: Diagnosis not present

## 2021-11-15 DIAGNOSIS — R531 Weakness: Secondary | ICD-10-CM | POA: Diagnosis not present

## 2021-11-15 DIAGNOSIS — Z9889 Other specified postprocedural states: Secondary | ICD-10-CM | POA: Diagnosis not present

## 2021-11-15 DIAGNOSIS — M25662 Stiffness of left knee, not elsewhere classified: Secondary | ICD-10-CM | POA: Diagnosis not present

## 2021-11-15 DIAGNOSIS — Z96652 Presence of left artificial knee joint: Secondary | ICD-10-CM | POA: Diagnosis not present

## 2021-11-18 ENCOUNTER — Other Ambulatory Visit: Payer: Self-pay | Admitting: Family Medicine

## 2022-01-14 ENCOUNTER — Other Ambulatory Visit: Payer: Self-pay | Admitting: Family Medicine

## 2022-03-06 DIAGNOSIS — M25462 Effusion, left knee: Secondary | ICD-10-CM | POA: Diagnosis not present

## 2022-03-06 DIAGNOSIS — Z96652 Presence of left artificial knee joint: Secondary | ICD-10-CM | POA: Diagnosis not present

## 2022-04-05 DIAGNOSIS — M25462 Effusion, left knee: Secondary | ICD-10-CM | POA: Diagnosis not present

## 2022-04-05 DIAGNOSIS — Z96652 Presence of left artificial knee joint: Secondary | ICD-10-CM | POA: Diagnosis not present

## 2022-04-05 DIAGNOSIS — M25562 Pain in left knee: Secondary | ICD-10-CM | POA: Diagnosis not present

## 2022-04-15 ENCOUNTER — Other Ambulatory Visit: Payer: Self-pay | Admitting: Family Medicine

## 2022-04-17 DIAGNOSIS — Z1231 Encounter for screening mammogram for malignant neoplasm of breast: Secondary | ICD-10-CM | POA: Diagnosis not present

## 2022-05-02 ENCOUNTER — Other Ambulatory Visit (INDEPENDENT_AMBULATORY_CARE_PROVIDER_SITE_OTHER): Payer: Medicare Other

## 2022-05-02 DIAGNOSIS — R739 Hyperglycemia, unspecified: Secondary | ICD-10-CM

## 2022-05-02 DIAGNOSIS — E559 Vitamin D deficiency, unspecified: Secondary | ICD-10-CM

## 2022-05-02 DIAGNOSIS — E785 Hyperlipidemia, unspecified: Secondary | ICD-10-CM | POA: Diagnosis not present

## 2022-05-02 DIAGNOSIS — E538 Deficiency of other specified B group vitamins: Secondary | ICD-10-CM

## 2022-05-02 LAB — LIPID PANEL
Cholesterol: 180 mg/dL (ref 0–200)
HDL: 80.2 mg/dL (ref >39.00)
LDL Cholesterol: 80 mg/dL (ref 0–99)
NonHDL: 100.29
Total CHOL/HDL Ratio: 2
Triglycerides: 102 mg/dL (ref 0.0–149.0)
VLDL: 20.4 mg/dL (ref 0.0–40.0)

## 2022-05-02 LAB — BASIC METABOLIC PANEL
BUN: 13 mg/dL (ref 6–23)
CO2: 26 mEq/L (ref 19–32)
Calcium: 9.6 mg/dL (ref 8.4–10.5)
Chloride: 102 mEq/L (ref 96–112)
Creatinine, Ser: 0.69 mg/dL (ref 0.40–1.20)
GFR: 90.83 mL/min (ref >60.00)
Glucose, Bld: 108 mg/dL — ABNORMAL HIGH (ref 70–99)
Potassium: 4.3 mEq/L (ref 3.5–5.1)
Sodium: 138 mEq/L (ref 135–145)

## 2022-05-02 LAB — URINALYSIS, ROUTINE W REFLEX MICROSCOPIC
Bilirubin Urine: NEGATIVE
Ketones, ur: NEGATIVE
Leukocytes,Ua: NEGATIVE
Nitrite: NEGATIVE
Specific Gravity, Urine: 1.015 (ref 1.000–1.030)
Total Protein, Urine: NEGATIVE
Urine Glucose: NEGATIVE
Urobilinogen, UA: 0.2 (ref 0.0–1.0)
pH: 7 (ref 5.0–8.0)

## 2022-05-02 LAB — CBC WITH DIFFERENTIAL/PLATELET
Basophils Absolute: 0 10*3/uL (ref 0.0–0.1)
Basophils Relative: 0.3 % (ref 0.0–3.0)
Eosinophils Absolute: 0.2 10*3/uL (ref 0.0–0.7)
Eosinophils Relative: 2.9 % (ref 0.0–5.0)
HCT: 45.2 % (ref 36.0–46.0)
Hemoglobin: 15.3 g/dL — ABNORMAL HIGH (ref 12.0–15.0)
Lymphocytes Relative: 37.8 % (ref 12.0–46.0)
Lymphs Abs: 2.6 10*3/uL (ref 0.7–4.0)
MCHC: 33.9 g/dL (ref 30.0–36.0)
MCV: 99.8 fl (ref 78.0–100.0)
Monocytes Absolute: 0.7 10*3/uL (ref 0.1–1.0)
Monocytes Relative: 10.6 % (ref 3.0–12.0)
Neutro Abs: 3.3 10*3/uL (ref 1.4–7.7)
Neutrophils Relative %: 48.4 % (ref 43.0–77.0)
Platelets: 317 10*3/uL (ref 150.0–400.0)
RBC: 4.53 Mil/uL (ref 3.87–5.11)
RDW: 13.3 % (ref 11.5–15.5)
WBC: 6.8 10*3/uL (ref 4.0–10.5)

## 2022-05-02 LAB — TSH: TSH: 1.22 u[IU]/mL (ref 0.35–5.50)

## 2022-05-02 LAB — VITAMIN B12: Vitamin B-12: 617 pg/mL (ref 211–911)

## 2022-05-02 LAB — HEMOGLOBIN A1C: Hgb A1c MFr Bld: 5.9 % (ref 4.6–6.5)

## 2022-05-02 LAB — HEPATIC FUNCTION PANEL
ALT: 28 U/L (ref 0–35)
AST: 24 U/L (ref 0–37)
Albumin: 4.6 g/dL (ref 3.5–5.2)
Alkaline Phosphatase: 65 U/L (ref 39–117)
Bilirubin, Direct: 0.1 mg/dL (ref 0.0–0.3)
Total Bilirubin: 0.7 mg/dL (ref 0.2–1.2)
Total Protein: 7.6 g/dL (ref 6.0–8.3)

## 2022-05-02 LAB — VITAMIN D 25 HYDROXY (VIT D DEFICIENCY, FRACTURES): VITD: 38.82 ng/mL (ref 30.00–100.00)

## 2022-05-08 ENCOUNTER — Encounter: Payer: Self-pay | Admitting: Internal Medicine

## 2022-05-08 ENCOUNTER — Ambulatory Visit (INDEPENDENT_AMBULATORY_CARE_PROVIDER_SITE_OTHER): Payer: Medicare Other | Admitting: Internal Medicine

## 2022-05-08 VITALS — BP 122/82 | HR 87 | Resp 18 | Ht 62.0 in | Wt 165.0 lb

## 2022-05-08 DIAGNOSIS — Z1211 Encounter for screening for malignant neoplasm of colon: Secondary | ICD-10-CM

## 2022-05-08 DIAGNOSIS — E7800 Pure hypercholesterolemia, unspecified: Secondary | ICD-10-CM | POA: Diagnosis not present

## 2022-05-08 DIAGNOSIS — E538 Deficiency of other specified B group vitamins: Secondary | ICD-10-CM | POA: Diagnosis not present

## 2022-05-08 DIAGNOSIS — Z0001 Encounter for general adult medical examination with abnormal findings: Secondary | ICD-10-CM

## 2022-05-08 DIAGNOSIS — E559 Vitamin D deficiency, unspecified: Secondary | ICD-10-CM

## 2022-05-08 DIAGNOSIS — Z23 Encounter for immunization: Secondary | ICD-10-CM | POA: Diagnosis not present

## 2022-05-08 DIAGNOSIS — M25562 Pain in left knee: Secondary | ICD-10-CM | POA: Diagnosis not present

## 2022-05-08 DIAGNOSIS — E2839 Other primary ovarian failure: Secondary | ICD-10-CM

## 2022-05-08 DIAGNOSIS — I10 Essential (primary) hypertension: Secondary | ICD-10-CM

## 2022-05-08 DIAGNOSIS — R739 Hyperglycemia, unspecified: Secondary | ICD-10-CM

## 2022-05-08 MED ORDER — AMLODIPINE BESYLATE 5 MG PO TABS
5.0000 mg | ORAL_TABLET | Freq: Every day | ORAL | 3 refills | Status: DC
Start: 2022-05-08 — End: 2023-05-14

## 2022-05-08 MED ORDER — CITALOPRAM HYDROBROMIDE 10 MG PO TABS: 15.0000 mg | ORAL_TABLET | Freq: Every day | ORAL | 3 refills | Status: DC

## 2022-05-08 MED ORDER — LOVASTATIN 20 MG PO TABS: 20.0000 mg | ORAL_TABLET | Freq: Every day | ORAL | 3 refills | Status: DC

## 2022-05-08 MED ORDER — LOSARTAN POTASSIUM 50 MG PO TABS: 50.0000 mg | ORAL_TABLET | Freq: Every day | ORAL | 3 refills | Status: DC

## 2022-05-08 NOTE — Progress Notes (Unsigned)
Patient ID: Barbara Gross, female   DOB: 1956/01/06, 66 y.o.   MRN: [id number]         Chief Complaint:: wellness exam and low vit d, hld, htn, hyperglycemia       HPI:  Barbara Gross is a 66 y.o. female here for wellness exam'; due for DXA, colonscopy; declines covid booster but ok for prevnar 20, o/w up to date                        Also sp left knee TKR nov 2022, finished PT but still with pain and accumuating fluid laterally, to f/u today for more needle drainage.  Unfortuantely not working out as wel as hoped and not able to exercise her 2 miles.  Pt denies chest pain, increased sob or doe, wheezing, orthopnea, PND, increased LE swelling, palpitations, dizziness or syncope.   Pt denies polydipsia, polyuria, or new focal neuro s/s.    Pt denies fever, wt loss, night sweats, loss of appetite, or other constitutional symptoms     Wt Readings from Last 3 Encounters:  05/08/22 165 lb (74.8 kg)  07/26/21 165 lb (74.8 kg)  07/04/21 163 lb (73.9 kg)   BP Readings from Last 3 Encounters:  05/08/22 122/82  07/26/21 138/80  07/04/21 (!) 136/98   Immunization History  Administered Date(s) Administered   Fluad Quad(high Dose 65+) 07/26/2021   Influenza Whole 09/03/2002   Influenza,inj,Quad PF,6+ Mos 07/16/2019   Influenza-Unspecified 07/21/2018   PFIZER Comirnaty(Gray Top)Covid-19 Tri-Sucrose Vaccine 01/09/2021   PFIZER(Purple Top)SARS-COV-2 Vaccination 12/24/2019, 01/21/2020, 08/18/2020   PNEUMOCOCCAL CONJUGATE-20 05/08/2022   Td 10/02/1995, 01/03/2009   Tdap 04/28/2019   Zoster Recombinat (Shingrix) 08/28/2019, 10/05/2019   Health Maintenance Due  Topic Date Due   COLONOSCOPY (Pts 45-45yr Insurance coverage will need to be confirmed)  06/23/2019   DEXA SCAN  Never done      Past Medical History:  Diagnosis Date   ABDOMINAL PAIN, LOWER 05/22/2010   Allergy    pollen    ANXIETY 12/24/2007   Arthritis    back , shoulder   BACTERIAL VAGINITIS 12/24/2007   BREAST MASS,  LEFT 07/15/2008   DEPRESSION 12/24/2007   DIVERTICULITIS OF COLON 04/21/2010   History of migraine    none in years   HYPERLIPIDEMIA 05/31/2007   HYPERTENSION 05/31/2007   Hypertension    Lesion of bladder    MENOPAUSE, EARLY 01/04/2010   Microscopic hematuria 01/03/2009   PONV (postoperative nausea and vomiting)    Past Surgical History:  Procedure Laterality Date   ABDOMINAL HYSTERECTOMY  1990   COLONOSCOPY  2018   CYSTOSCOPY WITH BIOPSY N/A 03/01/2020   Procedure: CYSTOSCOPY WITH BIOPSY, FULGERATION BLADDER;  Surgeon: PRobley Fries MD;  Location: [facility]  Service: Urology;  Laterality: N/A;   EXPLORATORY LAPAROTOMY  as teenager   OOPHORECTOMY Bilateral 1990   POLYPECTOMY  2018   TONSILLECTOMY  as child    reports that she has quit smoking. Her smoking use included cigarettes. She has a 15.00 pack-year smoking history. She has never used smokeless tobacco. She reports current alcohol use of about 14.0 standard drinks of alcohol per week. She reports that she does not use drugs. family history includes Cancer in an other family member; Colon cancer in her paternal grandmother and another family member; Colon polyps in her father; Diabetes in her father; Hyperlipidemia in her father and mother; Hypertension in her father and mother; Melanoma in an  other family member; Stroke in an other family member; Sudden death in her mother; Ulcers in her mother. Allergies  Allergen Reactions   Codeine Nausea And Vomiting   Effexor [Venlafaxine] Nausea Only   Hydrocodone Nausea And Vomiting   Current Outpatient Medications on File Prior to Visit  Medication Sig Dispense Refill   aspirin 81 MG EC tablet Take 81 mg by mouth daily.     cyclobenzaprine (FLEXERIL) 5 MG tablet Take 1 tablet (5 mg total) by mouth 3 (three) times daily as needed for muscle spasms. 40 tablet 1   Diclofenac Sodium (PENNSAID) 2 % SOLN Place 2 g onto the skin 2 (two) times daily. 112 g 3   gabapentin  (NEURONTIN) 100 MG capsule TAKE ONE CAPSULE BY MOUTH TWICE A DAY 180 capsule 0   gabapentin (NEURONTIN) 300 MG capsule TAKE ONE CAPSULE BY MOUTH EVERY NIGHT AT BEDTIME 90 capsule 0   tiZANidine (ZANAFLEX) 2 MG tablet Take 1 tablet (2 mg total) by mouth at bedtime. 30 tablet 0   No current facility-administered medications on file prior to visit.        ROS:  All others reviewed and negative.  Objective        PE:  BP 122/82   Pulse 87   Resp 18   Ht '5\' 2"'$  (1.575 m)   Wt 165 lb (74.8 kg)   SpO2 94%   BMI 30.18 kg/m                 Constitutional: Pt appears in NAD               HENT: Head: NCAT.                Right Ear: External ear normal.                 Left Ear: External ear normal.                Eyes: . Pupils are equal, round, and reactive to light. Conjunctivae and EOM are normal               Nose: without d/c or deformity               Neck: Neck supple. Gross normal ROM               Cardiovascular: Normal rate and regular rhythm.                 Pulmonary/Chest: Effort normal and breath sounds without rales or wheezing.                Abd:  Soft, NT, ND, + BS, no organomegaly               Neurological: Pt is alert. At baseline orientation, motor grossly intact               Skin: Skin is warm. No rashes, no other new lesions, LE edema - none               Psychiatric: Pt behavior is normal without agitation   Micro: none  Cardiac tracings I have personally interpreted today:  none  Pertinent Radiological findings (summarize): none   Lab Results  Component Value Date   WBC 6.8 05/02/2022   HGB 15.3 (H) 05/02/2022   HCT 45.2 05/02/2022   PLT 317.0 05/02/2022   GLUCOSE 108 (H) 05/02/2022   CHOL 180 05/02/2022   TRIG 102.0  05/02/2022   HDL 80.20 05/02/2022   LDLCALC 80 05/02/2022   ALT 28 05/02/2022   AST 24 05/02/2022   NA 138 05/02/2022   K 4.3 05/02/2022   CL 102 05/02/2022   CREATININE 0.69 05/02/2022   BUN 13 05/02/2022   CO2 26 05/02/2022   TSH  1.22 05/02/2022   HGBA1C 5.9 05/02/2022   Assessment/Plan:  Barbara Gross is a 66 y.o. White or Caucasian [1] female with  has a past medical history of ABDOMINAL PAIN, LOWER (05/22/2010), Allergy, ANXIETY (12/24/2007), Arthritis, BACTERIAL VAGINITIS (12/24/2007), BREAST MASS, LEFT (07/15/2008), DEPRESSION (12/24/2007), DIVERTICULITIS OF COLON (04/21/2010), History of migraine, HYPERLIPIDEMIA (05/31/2007), HYPERTENSION (05/31/2007), Hypertension, Lesion of bladder, MENOPAUSE, EARLY (01/04/2010), Microscopic hematuria (01/03/2009), and PONV (postoperative nausea and vomiting).  Vitamin D deficiency Last vitamin D Lab Results  Component Value Date   VD25OH 38.82 05/02/2022   Low, to start oral replacement   Encounter for well adult exam with abnormal findings Age and sex appropriate education and counseling updated with regular exercise and diet Referrals for preventative services - for DXA, and colonoscopy Immunizations addressed - declines covid booster, but for Prevnar 20 today Smoking counseling  - none needed Evidence for depression or other mood disorder - none significant Most recent labs reviewed. I have personally reviewed and have noted: 1) the patient's medical and social history 2) The patient's current medications and supplements 3) The patient's height, weight, and BMI have been recorded in the chart   HLD (hyperlipidemia) Lab Results  Component Value Date   LDLCALC 80 05/02/2022   Stable, pt to continue current statin lovastatin 20 mg qd   Essential hypertension BP Readings from Last 3 Encounters:  05/08/22 122/82  07/26/21 138/80  07/04/21 (!) 136/98   Stable, pt to continue medical treatment norvasc 5 mg qd, losartan 50 mg qd   Hyperglycemia Lab Results  Component Value Date   HGBA1C 5.9 05/02/2022   Stable, pt to continue current medical treatment  - diet, wt control, activity  Followup: Return in about 1 year (around 05/09/2023).  Cathlean Cower, MD  05/09/2022 4:39 PM [facility]

## 2022-05-08 NOTE — Assessment & Plan Note (Signed)
Last vitamin D Lab Results  Component Value Date   VD25OH 38.82 05/02/2022   Low, to start oral replacement

## 2022-05-08 NOTE — Patient Instructions (Signed)
Please take OTC Vitamin D3 at 2000 units per day, indefinitely  Please schedule the bone density test before leaving today at the scheduling desk (where you check out)  You will be contacted regarding the referral for: colonoscopy  You had the Prevnar 20 pneumonia shot today  Please continue all other medications as before, and refills have been done if requested.  Please have the pharmacy call with any other refills you may need.  Please continue your efforts at being more active, low cholesterol diet, and weight control.  You are otherwise up to date with prevention measures today.  Please keep your appointments with your specialists as you may have planned  Please make an Appointment to return for your 1 year visit, or sooner if needed, with Lab testing by Appointment as well, to be done about 3-5 days before at the [facility] [place] (so this is for TWO appointments - please see the scheduling desk as you leave

## 2022-05-09 ENCOUNTER — Encounter: Payer: Self-pay | Admitting: Internal Medicine

## 2022-05-09 NOTE — Assessment & Plan Note (Signed)
BP Readings from Last 3 Encounters:  05/08/22 122/82  07/26/21 138/80  07/04/21 (!) 136/98   Stable, pt to continue medical treatment norvasc 5 mg qd, losartan 50 mg qd

## 2022-05-09 NOTE — Assessment & Plan Note (Signed)
Age and sex appropriate education and counseling updated with regular exercise and diet Referrals for preventative services - for DXA, and colonoscopy Immunizations addressed - declines covid booster, but for Prevnar 20 today Smoking counseling  - none needed Evidence for depression or other mood disorder - none significant Most recent labs reviewed. I have personally reviewed and have noted: 1) the patient's medical and social history 2) The patient's current medications and supplements 3) The patient's height, weight, and BMI have been recorded in the chart

## 2022-05-09 NOTE — Assessment & Plan Note (Signed)
Lab Results  Component Value Date   LDLCALC 80 05/02/2022   Stable, pt to continue current statin lovastatin 20 mg qd

## 2022-05-09 NOTE — Assessment & Plan Note (Signed)
Lab Results  Component Value Date   HGBA1C 5.9 05/02/2022   Stable, pt to continue current medical treatment  - diet, wt control, activity

## 2022-05-15 ENCOUNTER — Ambulatory Visit (INDEPENDENT_AMBULATORY_CARE_PROVIDER_SITE_OTHER)
Admission: RE | Admit: 2022-05-15 | Discharge: 2022-05-15 | Disposition: A | Discharge status: Home / Self Care | Payer: Medicare Other | Source: Ambulatory Visit | Attending: Internal Medicine | Admitting: Internal Medicine

## 2022-05-15 DIAGNOSIS — E2839 Other primary ovarian failure: Secondary | ICD-10-CM | POA: Diagnosis not present

## 2022-05-18 DIAGNOSIS — E2839 Other primary ovarian failure: Secondary | ICD-10-CM | POA: Diagnosis not present

## 2022-05-21 ENCOUNTER — Encounter: Payer: Self-pay | Admitting: Internal Medicine

## 2022-05-21 NOTE — Telephone Encounter (Signed)
Agreed, would need ROV as I think this is a new problem for her

## 2022-05-21 NOTE — Telephone Encounter (Unsigned)
Please re-advise  Pt stated that she "I've been having pain in my right hip and down my leg. Painful walking and throbbing in my buttock" I advice that she need to be seen in order to be evaluated for it

## 2022-05-23 ENCOUNTER — Ambulatory Visit (INDEPENDENT_AMBULATORY_CARE_PROVIDER_SITE_OTHER): Payer: Medicare Other | Admitting: Internal Medicine

## 2022-05-23 ENCOUNTER — Encounter: Payer: Self-pay | Admitting: Internal Medicine

## 2022-05-23 VITALS — BP 134/90 | HR 71 | Temp 98.2°F | Ht 62.0 in | Wt 163.5 lb

## 2022-05-23 DIAGNOSIS — R739 Hyperglycemia, unspecified: Secondary | ICD-10-CM | POA: Diagnosis not present

## 2022-05-23 DIAGNOSIS — M5416 Radiculopathy, lumbar region: Secondary | ICD-10-CM

## 2022-05-23 DIAGNOSIS — E559 Vitamin D deficiency, unspecified: Secondary | ICD-10-CM

## 2022-05-23 DIAGNOSIS — I10 Essential (primary) hypertension: Secondary | ICD-10-CM | POA: Diagnosis not present

## 2022-05-23 MED ORDER — PREDNISONE 10 MG PO TABS: ORAL_TABLET | ORAL | 0 refills | Status: DC

## 2022-05-23 MED ORDER — GABAPENTIN 100 MG PO CAPS: 200.0000 mg | ORAL_CAPSULE | Freq: Two times a day (BID) | ORAL | 1 refills | Status: DC

## 2022-05-23 MED ORDER — GABAPENTIN 300 MG PO CAPS: 300.0000 mg | ORAL_CAPSULE | Freq: Every day | ORAL | 1 refills | Status: DC

## 2022-05-23 NOTE — Patient Instructions (Signed)
Ok to increase the gabapentin to 200 mg twice per day, then 300 mg at bedtime  Please take all new medication as prescribed  - the prednisone  Please continue all other medications as before, and refills have been done if requested.  Please have the pharmacy call with any other refills you may need.  Please keep your appointments with your specialists as you may have planned  You will be contacted regarding the referral for: MRI for the lumbar

## 2022-05-23 NOTE — Progress Notes (Signed)
Patient ID: Barbara Gross, female   DOB: [birth year], 66 y.o.   MRN: [id number]        Chief Complaint: follow up right lower back and leg pain       HPI:  Barbara Gross is a 66 y.o. female here with 1 mo gradually owrsening now mod to evere at times right lower back pain with radiation to the right buttocks and right leg distally to the calf and ankle, with numbness and intermitettent weakness  Gabapentin 300 mg qhs does help but 100 mg during day not too much, but also 300 mg makes her sleepy.  Pt denies chest pain, increased sob or doe, wheezing, orthopnea, PND, increased LE swelling, palpitations, dizziness or syncope.   Pt denies polydipsia, polyuria, or new focal neuro s/s.    Pt denies fever, wt loss, night sweats, loss of appetite, or other constitutional symptoms       Wt Readings from Last 3 Encounters:  05/23/22 163 lb 8 oz (74.2 kg)  05/08/22 165 lb (74.8 kg)  07/26/21 165 lb (74.8 kg)   BP Readings from Last 3 Encounters:  05/23/22 (!) 134/90  05/08/22 122/82  07/26/21 138/80         Past Medical History:  Diagnosis Date   ABDOMINAL PAIN, LOWER 05/22/2010   Allergy    pollen    ANXIETY 12/24/2007   Arthritis    back , shoulder   BACTERIAL VAGINITIS 12/24/2007   BREAST MASS, LEFT 07/15/2008   DEPRESSION 12/24/2007   DIVERTICULITIS OF COLON 04/21/2010   History of migraine    none in years   HYPERLIPIDEMIA 05/31/2007   HYPERTENSION 05/31/2007   Hypertension    Lesion of bladder    MENOPAUSE, EARLY 01/04/2010   Microscopic hematuria 01/03/2009   PONV (postoperative nausea and vomiting)    Past Surgical History:  Procedure Laterality Date   ABDOMINAL HYSTERECTOMY  1990   COLONOSCOPY  2018   CYSTOSCOPY WITH BIOPSY N/A 03/01/2020   Procedure: CYSTOSCOPY WITH BIOPSY, FULGERATION BLADDER;  Surgeon: Robley Fries, MD;  Location: [facility];  Service: Urology;  Laterality: N/A;   EXPLORATORY LAPAROTOMY  as teenager   OOPHORECTOMY Bilateral 1990    POLYPECTOMY  2018   TONSILLECTOMY  as child    reports that she has quit smoking. Her smoking use included cigarettes. She has a 15.00 pack-year smoking history. She has never used smokeless tobacco. She reports current alcohol use of about 14.0 standard drinks of alcohol per week. She reports that she does not use drugs. family history includes Cancer in an other family member; Colon cancer in her paternal grandmother and another family member; Colon polyps in her father; Diabetes in her father; Hyperlipidemia in her father and mother; Hypertension in her father and mother; Melanoma in an other family member; Stroke in an other family member; Sudden death in her mother; Ulcers in her mother. Allergies  Allergen Reactions   Codeine Nausea And Vomiting   Effexor [Venlafaxine] Nausea Only   Hydrocodone Nausea And Vomiting   Current Outpatient Medications on File Prior to Visit  Medication Sig Dispense Refill   amLODipine (NORVASC) 5 MG tablet Take 1 tablet (5 mg total) by mouth daily. 90 tablet 3   aspirin 81 MG EC tablet Take 81 mg by mouth daily.     citalopram (CELEXA) 10 MG tablet Take 1.5 tablets (15 mg total) by mouth daily. 135 tablet 3   cyclobenzaprine (FLEXERIL) 5 MG tablet Take 1 tablet (  5 mg total) by mouth 3 (three) times daily as needed for muscle spasms. 40 tablet 1   Diclofenac Sodium (PENNSAID) 2 % SOLN Place 2 g onto the skin 2 (two) times daily. 112 g 3   losartan (COZAAR) 50 MG tablet Take 1 tablet (50 mg total) by mouth daily. 90 tablet 3   lovastatin (MEVACOR) 20 MG tablet Take 1 tablet (20 mg total) by mouth at bedtime. 90 tablet 3   tiZANidine (ZANAFLEX) 2 MG tablet Take 1 tablet (2 mg total) by mouth at bedtime. 30 tablet 0   No current facility-administered medications on file prior to visit.        ROS:  All others reviewed and negative.  Objective        PE:  BP (!) 134/90 (BP Location: Left Arm, Patient Position: Sitting, Cuff Size: Large)   Pulse 71   Temp  98.2 F (36.8 C) (Oral)   Ht '5\' 2"'$  (1.575 m)   Wt 163 lb 8 oz (74.2 kg)   SpO2 95%   BMI 29.90 kg/m                 Constitutional: Pt appears in NAD               HENT: Head: NCAT.                Right Ear: External ear normal.                 Left Ear: External ear normal.                Eyes: . Pupils are equal, round, and reactive to light. Conjunctivae and EOM are normal               Nose: without d/c or deformity               Neck: Neck supple. Gross normal ROM               Cardiovascular: Normal rate and regular rhythm.                 Pulmonary/Chest: Effort normal and breath sounds without rales or wheezing.                Abd:  Soft, NT, ND, + BS, no organomegaly               Neurological: Pt is alert. At baseline orientation, motor with 4/5 RLE weakness               Skin: Skin is warm. No rashes, no other new lesions, LE edema - none               Psychiatric: Pt behavior is normal without agitation   Micro: none  Cardiac tracings I have personally interpreted today:  none  Pertinent Radiological findings (summarize): none   Lab Results  Component Value Date   WBC 6.8 05/02/2022   HGB 15.3 (H) 05/02/2022   HCT 45.2 05/02/2022   PLT 317.0 05/02/2022   GLUCOSE 108 (H) 05/02/2022   CHOL 180 05/02/2022   TRIG 102.0 05/02/2022   HDL 80.20 05/02/2022   LDLCALC 80 05/02/2022   ALT 28 05/02/2022   AST 24 05/02/2022   NA 138 05/02/2022   K 4.3 05/02/2022   CL 102 05/02/2022   CREATININE 0.69 05/02/2022   BUN 13 05/02/2022   CO2 26 05/02/2022   TSH 1.22 05/02/2022   HGBA1C  5.9 05/02/2022   Assessment/Plan:  Barbara Gross is a 66 y.o. White or Caucasian [1] female with  has a past medical history of ABDOMINAL PAIN, LOWER (05/22/2010), Allergy, ANXIETY (12/24/2007), Arthritis, BACTERIAL VAGINITIS (12/24/2007), BREAST MASS, LEFT (07/15/2008), DEPRESSION (12/24/2007), DIVERTICULITIS OF COLON (04/21/2010), History of migraine, HYPERLIPIDEMIA (05/31/2007),  HYPERTENSION (05/31/2007), Hypertension, Lesion of bladder, MENOPAUSE, EARLY (01/04/2010), Microscopic hematuria (01/03/2009), and PONV (postoperative nausea and vomiting).  Right lumbar radiculopathy New onset with neuro change, for prednisone asd, increased gabapentin 200 bid and 300 qhs, and MRI LS spine  Essential hypertension BP Readings from Last 3 Encounters:  05/23/22 (!) 134/90  05/08/22 122/82  07/26/21 138/80   Mild uncontrolled, likely situational, pt to continue medical treatment norvasc 5 mg qd, losartan 50 mg qd    Hyperglycemia Lab Results  Component Value Date   HGBA1C 5.9 05/02/2022   Stable, pt to continue current medical treatment  - diet, wt control   Vitamin D deficiency Last vitamin D Lab Results  Component Value Date   VD25OH 38.82 05/02/2022   Low, reminded to start oral replacement  Followup: Return if symptoms worsen or fail to improve.  Cathlean Cower, MD 05/25/2022 8:22 PM [facility]

## 2022-05-25 ENCOUNTER — Encounter: Payer: Self-pay | Admitting: Internal Medicine

## 2022-05-25 DIAGNOSIS — M5416 Radiculopathy, lumbar region: Secondary | ICD-10-CM | POA: Insufficient documentation

## 2022-05-25 NOTE — Assessment & Plan Note (Signed)
New onset with neuro change, for prednisone asd, increased gabapentin 200 bid and 300 qhs, and MRI LS spine

## 2022-05-25 NOTE — Assessment & Plan Note (Signed)
Lab Results  Component Value Date   HGBA1C 5.9 05/02/2022   Stable, pt to continue current medical treatment  - diet, wt control

## 2022-05-25 NOTE — Assessment & Plan Note (Signed)
Last vitamin D Lab Results  Component Value Date   VD25OH 38.82 05/02/2022   Low, reminded to start oral replacement

## 2022-05-25 NOTE — Assessment & Plan Note (Signed)
BP Readings from Last 3 Encounters:  05/23/22 (!) 134/90  05/08/22 122/82  07/26/21 138/80   Mild uncontrolled, likely situational, pt to continue medical treatment norvasc 5 mg qd, losartan 50 mg qd

## 2022-05-30 ENCOUNTER — Other Ambulatory Visit: Payer: Self-pay | Admitting: Internal Medicine

## 2022-05-30 ENCOUNTER — Ambulatory Visit
Admission: RE | Admit: 2022-05-30 | Discharge: 2022-05-30 | Disposition: A | Discharge status: Home / Self Care | Payer: Medicare Other | Source: Ambulatory Visit | Attending: Internal Medicine | Admitting: Internal Medicine

## 2022-05-30 DIAGNOSIS — M5416 Radiculopathy, lumbar region: Secondary | ICD-10-CM

## 2022-05-30 DIAGNOSIS — M545 Low back pain, unspecified: Secondary | ICD-10-CM | POA: Diagnosis not present

## 2022-05-30 DIAGNOSIS — M48061 Spinal stenosis, lumbar region without neurogenic claudication: Secondary | ICD-10-CM | POA: Diagnosis not present

## 2022-05-31 ENCOUNTER — Encounter: Payer: Self-pay | Admitting: Internal Medicine

## 2022-05-31 NOTE — Telephone Encounter (Signed)
Please advise 

## 2022-06-20 DIAGNOSIS — M4316 Spondylolisthesis, lumbar region: Secondary | ICD-10-CM | POA: Diagnosis not present

## 2022-06-20 DIAGNOSIS — M48061 Spinal stenosis, lumbar region without neurogenic claudication: Secondary | ICD-10-CM | POA: Diagnosis not present

## 2022-06-20 DIAGNOSIS — M5416 Radiculopathy, lumbar region: Secondary | ICD-10-CM | POA: Diagnosis not present

## 2022-06-21 ENCOUNTER — Encounter: Payer: Self-pay | Admitting: Internal Medicine

## 2022-06-28 DIAGNOSIS — M25462 Effusion, left knee: Secondary | ICD-10-CM | POA: Diagnosis not present

## 2022-07-11 DIAGNOSIS — M5416 Radiculopathy, lumbar region: Secondary | ICD-10-CM | POA: Diagnosis not present

## 2022-07-17 ENCOUNTER — Other Ambulatory Visit: Payer: Self-pay

## 2022-07-17 ENCOUNTER — Encounter: Payer: Self-pay | Admitting: Physical Therapy

## 2022-07-17 ENCOUNTER — Ambulatory Visit: Payer: Medicare Other | Attending: Neurosurgery | Admitting: Physical Therapy

## 2022-07-17 DIAGNOSIS — M5416 Radiculopathy, lumbar region: Secondary | ICD-10-CM | POA: Diagnosis not present

## 2022-07-17 DIAGNOSIS — M6281 Muscle weakness (generalized): Secondary | ICD-10-CM | POA: Diagnosis not present

## 2022-07-17 NOTE — Therapy (Signed)
OUTPATIENT PHYSICAL THERAPY THORACOLUMBAR EVALUATION   Patient Name: Barbara Gross MRN: [id number] DOB:Mar 06, 1956, 66 y.o., female Today's Date: 07/17/2022   PT End of Session - 07/17/22 0814     Visit Number 1    Date for PT Re-Evaluation 09/11/22    Authorization Type BCBS Medicare    PT Start Time 0758    PT Stop Time 0840    PT Time Calculation (min) 42 min    Activity Tolerance Patient tolerated treatment well             Past Medical History:  Diagnosis Date   ABDOMINAL PAIN, LOWER 05/22/2010   Allergy    pollen    ANXIETY 12/24/2007   Arthritis    back , shoulder   BACTERIAL VAGINITIS 12/24/2007   BREAST MASS, LEFT 07/15/2008   DEPRESSION 12/24/2007   DIVERTICULITIS OF COLON 04/21/2010   History of migraine    none in years   HYPERLIPIDEMIA 05/31/2007   HYPERTENSION 05/31/2007   Hypertension    Lesion of bladder    MENOPAUSE, EARLY 01/04/2010   Microscopic hematuria 01/03/2009   PONV (postoperative nausea and vomiting)    Past Surgical History:  Procedure Laterality Date   ABDOMINAL HYSTERECTOMY  1990   COLONOSCOPY  2018   CYSTOSCOPY WITH BIOPSY N/A 03/01/2020   Procedure: CYSTOSCOPY WITH BIOPSY, FULGERATION BLADDER;  Surgeon: Robley Fries, MD;  Location: [facility];  Service: Urology;  Laterality: N/A;   EXPLORATORY LAPAROTOMY  as teenager   OOPHORECTOMY Bilateral 1990   POLYPECTOMY  2018   TONSILLECTOMY  as child   Patient Active Problem List   Diagnosis Date Noted   Right lumbar radiculopathy 05/25/2022   Vitamin D deficiency 05/08/2022   Preop exam for internal medicine 08/01/2021   Lactose intolerance 07/13/2020   Diarrhea 04/28/2020   Left knee pain 06/16/2019   Hyperglycemia 04/28/2019   Baker's cyst, left 02/27/2019   Arthritis of hand, 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   Encounter for well adult exam with abnormal findings 04/09/2017   Right-sided low back pain without sciatica 02/20/2016   Nonallopathic lesion-rib cage  07/11/2015   Nonallopathic lesion of cervical region 07/11/2015   Nonallopathic lesion of thoracic region 07/11/2015   Biceps tendinitis on left 06/17/2015   Left rotator cuff tear 04/15/2015   Bilateral shoulder pain 04/06/2015   Allergic rhinitis, cause unspecified 02/05/2014   Raynaud phenomenon 01/22/2013   Abnormal TSH 01/22/2013   Muscle strain, lower leg 06/18/2012   Lumbar radicular pain 04/24/2011   Colon polyps 01/12/2011   DIVERTICULITIS OF COLON 04/21/2010   MENOPAUSE, EARLY 01/04/2010   Microhematuria 01/03/2009   ANXIETY 12/24/2007   Depression 12/24/2007   HLD (hyperlipidemia) 05/31/2007   MIGRAINE HEADACHE 05/31/2007   Essential hypertension 05/31/2007    PCP: Cathlean Cower MD  REFERRING PROVIDER: Duffy Rhody MD  REFERRING DIAG: M54.16 lumbar back pain with radiculopathy affecting right lower extremity  Rationale for Evaluation and Treatment Rehabilitation  THERAPY DIAG: lumbar radiculopathy; weakness  ONSET DATE: 03/17/22  SUBJECTIVE:  SUBJECTIVE STATEMENT: I have really bad arthritis and pinched nerve in right LE started 4 months ago. Wonders if the knee triggered this from walking different. I've had back pain for years.   Had a steroid shot and that really helped last week.  Just got back from a cruise.  Max walk 1/2 mile on straight ground. PERTINENT HISTORY:  Left TKR November painful/slow to recover used a cane for 2-3 weeks Has a standing stepper at home Had PT for shoulder with [facility]   PAIN:  Are you having pain? no NPRS scale: 0/10 Pain location: right low back, buttock, right anterior thigh to knee level   Aggravating factors: bending over to feed cats; sitting;rising sit to stand; difficulty sleeping  Relieving factors: supine lying; steroid shot; Meloxicam;  sidelying PRECAUTIONS: None  WEIGHT BEARING RESTRICTIONS: No  FALLS:  Has patient fallen in last 6 months? 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   L2-L3: Disc bulge with endplate osteophytic ridging eccentric to the right. Mild canal stenosis. Narrowing of the right subarticular recess. Moderate right foraminal stenosis. No left foraminal stenosis.   L3-L4: Disc bulge with endplate osteophytic ridging eccentric to the left. No canal stenosis. Minor right foraminal stenosis. Moderate left foraminal stenosis.   L4-L5: Anterolisthesis with uncovering of disc bulge. Facet arthropathy with ligamentum flavum infolding. Moderate canal stenosis. Partial effacement of subarticular recesses.   L5-S1: Left greater than right facet arthropathy. No canal or foraminal stenosis.   IMPRESSION: Multilevel degenerative changes as detailed above. Right subarticular recess and foraminal narrowing at L2-L3. Subarticular recess narrowing also present at L4-L5.        PATIENT SURVEYS:  FOTO 58%   COGNITION:  Overall cognitive status: Within functional limits for tasks assessed     SENSATION: WFL  MUSCLE LENGTH: Hamstrings: Right 60 deg; Left 60 deg Thomas test: Right 5 deg; Left 5 deg  POSTURE: decreased lumbar lordosis  PALPATION: No tender points in lumbar paraspinals and gluteals  LUMBAR ROM:  Prone not comfortable  AROM eval  Flexion 35  Extension 20  Right lateral flexion 30  Left lateral flexion 30  Right rotation    Left rotation    (Blank rows = not tested) TRUNK STRENGTH:  Decreased activation of transverse abdominus muscles; abdominals 4-/5; decreased activation of lumbar multifidi; trunk extensors 4-/5  LOWER EXTREMITY ROM:   good LE ROM including left TKR side with full knee flexion, although weakness left quad making terminal knee extension difficult  LOWER EXTREMITY MMT:    MMT Right eval Left eval  Hip flexion 4 4  Hip extension 4- 4  Hip abduction 4- 4  Hip adduction    Hip internal rotation    Hip external rotation    Knee flexion    Knee extension 4 3+  Ankle dorsiflexion 4 4  Ankle plantarflexion 4 4  Ankle inversion    Ankle eversion     (Blank rows = not tested)  LUMBAR SPECIAL TESTS:  Straight leg raise test: Negative, Slump test: Negative,  and Single leg stance test: Positive  right side 5 sec with lateral trunk lean; left side 3 sec with excessive knee flexion   FUNCTIONAL TESTS:  Timed up and go (TUG): 16 sec   GAIT: Comments: no assistive device, moderate limp with decreased stance time on left; right pelvic drop    TODAY'S TREATMENT:  [facility] Adult PT Treatment:                                                                                                                            DATE: 07/17/22 Initial HEP    PATIENT EDUCATION:  Education details: plan of care Person educated: Patient Education method: Explanation Education comprehension: verbalized understanding   HOME EXERCISE PROGRAM: Access Code: [email or web address] URL: [email or web address] Date: 07/17/2022 Prepared by: Ruben Im  Exercises - Seated Abdominal Set  - 5 x daily - 7 x weekly - 1 sets - 8 reps - 5 hold - Clamshell  - 1 x daily - 7 x weekly - 10 reps - Sidelying Hip Abduction  - 1 x daily - 7 x weekly - 1 sets - 10 reps - Sit to Stand  - 1 x daily - 7 x weekly - 1 sets - 10 reps  ASSESSMENT:  CLINICAL IMPRESSION: Patient is a 67 y.o. female who was seen today for  physical therapy evaluation and treatment for lumbar radiculopathy. Long history 7 years of back pain but new onset of right thigh pain starting 4 months ago.  Past medical history significant for left TKR last November with a slow recovery included continued quad weakness making it difficult to descend steps and walk hills.  Altered gait pattern noted.   The patient would benefit from PT to address trunk and hip range of motion deficits, strength asymmetries in lumbo/pelvic,hip  and knee regions and pain levels that are currently affecting activities of daily living at home and work including sitting, standing, sleeping, walking, lifting and performing hobbies and recreational activities including walking at [employer] (hilly).     OBJECTIVE IMPAIRMENTS: decreased mobility, difficulty walking, decreased ROM, decreased strength, impaired perceived functional ability, and pain.   ACTIVITY LIMITATIONS: carrying, lifting, bending, sitting, standing, sleeping, stairs, and locomotion level  PARTICIPATION LIMITATIONS: cleaning, laundry, community activity, and occupation  PERSONAL FACTORS: Profession, Time since onset of injury/illness/exacerbation, and 1 comorbidity: multi region OA  are also affecting patient's functional outcome.   REHAB POTENTIAL: Good  CLINICAL DECISION MAKING: Stable/uncomplicated  EVALUATION COMPLEXITY: Low   GOALS: Goals reviewed with patient? Yes  SHORT TERM GOALS: Target date: 08/14/2022  The patient will demonstrate knowledge of basic self care strategies and exercises to promote healing   Baseline: Goal status: INITIAL  2.  The patient will report a 30% improvement in pain levels with functional activities   Baseline:  Goal status: INITIAL  3.  The patient will have improved trunk flexor and extensor muscle strength to at least 4/5 needed for lifting medium weight objects such  as grocery bags  Baseline:  Goal status: INITIAL  4.  The patient will have  improved hip strength to at least 4/5 needed for standing, walking longer distances and descending stairs at home and in the community  Baseline:  Goal status: INITIAL  5.  The patient will have improved gait stamina and speed needed to ambulate 600 feet in 6 minutes  Baseline:  Goal status: INITIAL   LONG TERM GOALS: Target date:  09/11/2022  The patient will be independent in a safe self progression of a home exercise program to promote further recovery of function   Baseline:  Goal status: INITIAL  2.  The patient will report a 75% improvement in pain levels with functional activities which are currently difficult including sitting, walking, bending Baseline:  Goal status: INITIAL  3.  The patient will have improved trunk flexor and extensor muscle strength to at least 4+/5 needed for lifting medium weight objects such as laundry and luggage  Baseline:  Goal status: INITIAL  4.  The patient will have improved hip strength to at least 4+/5 needed for standing, walking longer distances and descending stairs at home and in the community  Baseline:  Goal status: INITIAL  5.  The patient will have improved FOTO score to   68%    indicating improved function with less pain  Baseline:  Goal status: INITIAL  6.  The patient will be able to resume walking at [employer] 1 mile Baseline:  Goal status: INITIAL   PLAN: PT FREQUENCY: 2x/week  PT DURATION: 8 weeks  PLANNED INTERVENTIONS: Therapeutic exercises, Therapeutic activity, Neuromuscular re-education, Patient/Family education, Self Care, Joint mobilization, Aquatic Therapy, Dry Needling, Electrical stimulation, Spinal manipulation, Spinal mobilization, Cryotherapy, Moist heat, Taping, Traction, Ultrasound, Ionotophoresis '4mg'$ /ml Dexamethasone, Manual therapy, and Re-evaluation.  PLAN FOR NEXT SESSION: review initial HEP; progress transverse abdominus activation in supine and/or seated; low level glute strengthening;   resisted walking (low level)  Ruben Im, PT 07/17/22 11:40 AM Phone: [phone] Fax: [phone]

## 2022-07-20 ENCOUNTER — Ambulatory Visit: Payer: Medicare Other | Admitting: Rehabilitative and Restorative Service Providers"

## 2022-07-20 ENCOUNTER — Encounter: Payer: Self-pay | Admitting: Rehabilitative and Restorative Service Providers"

## 2022-07-20 DIAGNOSIS — M6281 Muscle weakness (generalized): Secondary | ICD-10-CM

## 2022-07-20 DIAGNOSIS — M5416 Radiculopathy, lumbar region: Secondary | ICD-10-CM | POA: Diagnosis not present

## 2022-07-20 NOTE — Therapy (Signed)
OUTPATIENT PHYSICAL THERAPY TREATMENT NOTE   Patient Name: JESSEL GETTINGER MRN: [id number] DOB:1956/03/08, 66 y.o., female Today's Date: 07/20/2022   PT End of Session - 07/20/22 0803     Visit Number 2    Date for PT Re-Evaluation 09/11/22    Authorization Type BCBS Medicare    Progress Note Due on Visit 10    PT Start Time 0800    PT Stop Time 0840    PT Time Calculation (min) 40 min    Activity Tolerance Patient tolerated treatment well    Behavior During Therapy [facility] for tasks assessed/performed             Past Medical History:  Diagnosis Date   ABDOMINAL PAIN, LOWER 05/22/2010   Allergy    pollen    ANXIETY 12/24/2007   Arthritis    back , shoulder   BACTERIAL VAGINITIS 12/24/2007   BREAST MASS, LEFT 07/15/2008   DEPRESSION 12/24/2007   DIVERTICULITIS OF COLON 04/21/2010   History of migraine    none in years   HYPERLIPIDEMIA 05/31/2007   HYPERTENSION 05/31/2007   Hypertension    Lesion of bladder    MENOPAUSE, EARLY 01/04/2010   Microscopic hematuria 01/03/2009   PONV (postoperative nausea and vomiting)    Past Surgical History:  Procedure Laterality Date   ABDOMINAL HYSTERECTOMY  1990   COLONOSCOPY  2018   CYSTOSCOPY WITH BIOPSY N/A 03/01/2020   Procedure: CYSTOSCOPY WITH BIOPSY, FULGERATION BLADDER;  Surgeon: Robley Fries, MD;  Location: [place];  Service: Urology;  Laterality: N/A;   EXPLORATORY LAPAROTOMY  as teenager   OOPHORECTOMY Bilateral 1990   POLYPECTOMY  2018   TONSILLECTOMY  as child   Patient Active Problem List   Diagnosis Date Noted   Right lumbar radiculopathy 05/25/2022   Vitamin D deficiency 05/08/2022   Preop exam for internal medicine 08/01/2021   Lactose intolerance 07/13/2020   Diarrhea 04/28/2020   Left knee pain 06/16/2019   Hyperglycemia 04/28/2019   Baker's cyst, left 02/27/2019   Arthritis of hand, 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   Encounter for well adult exam with abnormal findings 04/09/2017    Right-sided low back pain without sciatica 02/20/2016   Nonallopathic lesion-rib cage 07/11/2015   Nonallopathic lesion of cervical region 07/11/2015   Nonallopathic lesion of thoracic region 07/11/2015   Biceps tendinitis on left 06/17/2015   Left rotator cuff tear 04/15/2015   Bilateral shoulder pain 04/06/2015   Allergic rhinitis, cause unspecified 02/05/2014   Raynaud phenomenon 01/22/2013   Abnormal TSH 01/22/2013   Muscle strain, lower leg 06/18/2012   Lumbar radicular pain 04/24/2011   Colon polyps 01/12/2011   DIVERTICULITIS OF COLON 04/21/2010   MENOPAUSE, EARLY 01/04/2010   Microhematuria 01/03/2009   ANXIETY 12/24/2007   Depression 12/24/2007   HLD (hyperlipidemia) 05/31/2007   MIGRAINE HEADACHE 05/31/2007   Essential hypertension 05/31/2007    PCP: Cathlean Cower MD  REFERRING PROVIDER: Duffy Rhody MD  REFERRING DIAG: M54.16 lumbar back pain with radiculopathy affecting right lower extremity  Rationale for Evaluation and Treatment Rehabilitation  THERAPY DIAG: lumbar radiculopathy; weakness  ONSET DATE: 03/17/22  SUBJECTIVE:  SUBJECTIVE STATEMENT: Pt reports that her HEP is going well.  PERTINENT HISTORY:  Left TKR November painful/slow to recover used a cane for 2-3 weeks Has a standing stepper at home Had PT for shoulder with [facility]   PAIN:  Are you having pain? yes NPRS scale: 2/10 Pain location: right low back, buttock, right anterior thigh to knee level   Aggravating factors: bending over to feed cats; sitting;rising sit to stand; difficulty sleeping  Relieving factors: supine lying; steroid shot; Meloxicam; sidelying  PRECAUTIONS: None  WEIGHT BEARING RESTRICTIONS: No  FALLS:  Has patient fallen in last 6 months? No  Did fall a few times after TKR knee give way  (uses rails to descend steps)  LIVING ENVIRONMENT: Lives with: lives alone Lives in: House/apartment Stairs: Yes: External: 3 steps; on right going up and on left going up;  downhill at [place] is tricky  OCCUPATION: Engineer, site (schedule commercials)  PLOF: Independent  PATIENT GOALS: get back to park walking; be able to walk faster; be out of back pain   OBJECTIVE:   DIAGNOSTIC FINDINGS 05/30/22:  L1-L2:  No canal or foraminal stenosis.   L2-L3: Disc bulge with endplate osteophytic ridging eccentric to the right. Mild canal stenosis. Narrowing of the right subarticular recess. Moderate right foraminal stenosis. No left foraminal stenosis.   L3-L4: Disc bulge with endplate osteophytic ridging eccentric to the left. No canal stenosis. Minor right foraminal stenosis. Moderate left foraminal stenosis.   L4-L5: Anterolisthesis with uncovering of disc bulge. Facet arthropathy with ligamentum flavum infolding. Moderate canal stenosis. Partial effacement of subarticular recesses.   L5-S1: Left greater than right facet arthropathy. No canal or foraminal stenosis.   IMPRESSION: Multilevel degenerative changes as detailed above. Right subarticular recess and foraminal narrowing at L2-L3. Subarticular recess narrowing also present at L4-L5.        PATIENT SURVEYS:  Eval:  FOTO 58%   COGNITION:  Overall cognitive status: Within functional limits for tasks assessed     SENSATION: WFL  MUSCLE LENGTH: Hamstrings: Right 60 deg; Left 60 deg Thomas test: Right 5 deg; Left 5 deg  POSTURE: decreased lumbar lordosis  PALPATION: No tender points in lumbar paraspinals and gluteals  LUMBAR ROM:  Prone not comfortable  AROM eval  Flexion 35  Extension 20  Right lateral flexion 30  Left lateral flexion 30  Right rotation   Left rotation    (Blank rows = not tested)  TRUNK STRENGTH:   Eval:  Decreased activation of transverse abdominus muscles; abdominals 4-/5; decreased  activation of lumbar multifidi; trunk extensors 4-/5  LOWER EXTREMITY ROM:    Eval:  good LE ROM including left TKR side with full knee flexion, although weakness left quad making terminal knee extension difficult  LOWER EXTREMITY MMT:    MMT Right eval Left eval  Hip flexion 4 4  Hip extension 4- 4  Hip abduction 4- 4  Hip adduction    Hip internal rotation    Hip external rotation    Knee flexion    Knee extension 4 3+  Ankle dorsiflexion 4 4  Ankle plantarflexion 4 4  Ankle inversion    Ankle eversion     (Blank rows = not tested)  LUMBAR SPECIAL TESTS:  Eval:  Straight leg raise test: Negative, Slump test: Negative, and Single leg stance test: Positive  right side 5 sec with lateral trunk lean; left side 3 sec with excessive knee flexion   FUNCTIONAL TESTS:  Eval:  Timed up and go (TUG):  16 sec   GAIT: Comments: no assistive device, moderate limp with decreased stance time on left; right pelvic drop    TODAY'S TREATMENT:   DATE: 07/20/2022 Nustep level 3 x6 min with PT present to discuss status Seated purple ball press into thighs for TA contraction 2x10 each Seated hip adduction ball squeeze and hip abduction with red band 2x10 Seated hamstring curl with red tband 2x10 bilat Sit to/from stand standing on black foam x10 Hooklying posterior pelvic tilt 2x10 Lower trunk rotation 3x10 sec (with cuing to only go as far as comfortable) Prone on elbows for 2 min Prone press up 2x10 Seated shoulder ER and horizontal abduction with red tband.  2x10 bilat   DATE: 07/17/22 Initial HEP    PATIENT EDUCATION:  Education details: plan of care Person educated: Patient Education method: Explanation Education comprehension: verbalized understanding   HOME EXERCISE PROGRAM: Access Code: [email or web address] URL: [email or web address] Date: 07/20/2022 Prepared by: Shelby Dubin Sven Pinheiro  Exercises - Seated Abdominal Set  - 5 x daily - 7 x weekly - 1 sets - 8 reps - 5  hold - Sit to Stand  - 1 x daily - 7 x weekly - 1 sets - 10 reps - Clamshell  - 1 x daily - 7 x weekly - 10 reps - Sidelying Hip Abduction  - 1 x daily - 7 x weekly - 1 sets - 10 reps - Supine Posterior Pelvic Tilt  - 1 x daily - 7 x weekly - 2 sets - 10 reps - Static Prone on Elbows  - 1 x daily - 7 x weekly - 1 sets - 1 reps - 2 min hold - Prone Press Up  - 1 x daily - 7 x weekly - 2 sets - 10 reps - Prone Hip Extension  - 1 x daily - 7 x weekly - 2 sets - 10 reps  ASSESSMENT:  CLINICAL IMPRESSION: Ms Gillham presents to skilled PT reporting compliance with initial HEP.  Pt able to progress through therex program without reports of increased pain.  She requires min cuing during exercises for improved posture and technique and pacing.  Pt reported some centralization of pain with prone exercises, so added to HEP.  Pt would benefit from continued skilled PT to progress towards goal related activities.  OBJECTIVE IMPAIRMENTS: decreased mobility, difficulty walking, decreased ROM, decreased strength, impaired perceived functional ability, and pain.   ACTIVITY LIMITATIONS: carrying, lifting, bending, sitting, standing, sleeping, stairs, and locomotion level  PARTICIPATION LIMITATIONS: cleaning, laundry, community activity, and occupation  PERSONAL FACTORS: Profession, Time since onset of injury/illness/exacerbation, and 1 comorbidity: multi region OA  are also affecting patient's functional outcome.   REHAB POTENTIAL: Good  CLINICAL DECISION MAKING: Stable/uncomplicated  EVALUATION COMPLEXITY: Low   GOALS: Goals reviewed with patient? Yes  SHORT TERM GOALS: Target date: 08/14/2022  The patient will demonstrate knowledge of basic self care strategies and exercises to promote healing   Baseline: Goal status: IN PROGRESS  2.  The patient will report a 30% improvement in pain levels with functional activities   Baseline:  Goal status: INITIAL  3.  The patient will have improved  trunk flexor and extensor muscle strength to at least 4/5 needed for lifting medium weight objects such as grocery bags  Baseline:  Goal status: INITIAL  4.  The patient will have improved hip strength to at least 4/5 needed for standing, walking longer distances and descending stairs at home and in the community  Baseline:  Goal status: INITIAL  5.  The patient will have improved gait stamina and speed needed to ambulate 600 feet in 6 minutes  Baseline:  Goal status: INITIAL   LONG TERM GOALS: Target date:  09/11/2022  The patient will be independent in a safe self progression of a home exercise program to promote further recovery of function   Baseline:  Goal status: INITIAL  2.  The patient will report a 75% improvement in pain levels with functional activities which are currently difficult including sitting, walking, bending Baseline:  Goal status: INITIAL  3.  The patient will have improved trunk flexor and extensor muscle strength to at least 4+/5 needed for lifting medium weight objects such as laundry and luggage  Baseline:  Goal status: INITIAL  4.  The patient will have improved hip strength to at least 4+/5 needed for standing, walking longer distances and descending stairs at home and in the community  Baseline:  Goal status: INITIAL  5.  The patient will have improved FOTO score to   68%    indicating improved function with less pain  Baseline:  Goal status: INITIAL  6.  The patient will be able to resume walking at [employer] 1 mile Baseline:  Goal status: INITIAL   PLAN: PT FREQUENCY: 2x/week  PT DURATION: 8 weeks  PLANNED INTERVENTIONS: Therapeutic exercises, Therapeutic activity, Neuromuscular re-education, Patient/Family education, Self Care, Joint mobilization, Aquatic Therapy, Dry Needling, Electrical stimulation, Spinal manipulation, Spinal mobilization, Cryotherapy, Moist heat, Taping, Traction, Ultrasound, Ionotophoresis '4mg'$ /ml Dexamethasone,  Manual therapy, and Re-evaluation.  PLAN FOR NEXT SESSION: review initial HEP; progress transverse abdominus activation in supine and/or seated; low level glute strengthening;  resisted walking (low level)   Juel Burrow, PT 07/20/22 8:43 AM  [facility] [address] [place], [place] [address] Phone # [phone] Fax [phone]

## 2022-07-25 ENCOUNTER — Ambulatory Visit: Payer: Medicare Other | Admitting: Physical Therapy

## 2022-07-25 DIAGNOSIS — M6281 Muscle weakness (generalized): Secondary | ICD-10-CM

## 2022-07-25 DIAGNOSIS — M5416 Radiculopathy, lumbar region: Secondary | ICD-10-CM

## 2022-07-25 NOTE — Therapy (Signed)
OUTPATIENT PHYSICAL THERAPY TREATMENT NOTE   Patient Name: Barbara Gross MRN: [id number] DOB:1956/08/07, 66 y.o., female Today's Date: 07/25/2022   PT End of Session - 07/25/22 0800     Visit Number 3    Date for PT Re-Evaluation 09/11/22    Authorization Type BCBS Medicare    Progress Note Due on Visit 10    PT Start Time 0800    PT Stop Time 0840    PT Time Calculation (min) 40 min    Activity Tolerance Patient tolerated treatment well             Past Medical History:  Diagnosis Date   ABDOMINAL PAIN, LOWER 05/22/2010   Allergy    pollen    ANXIETY 12/24/2007   Arthritis    back , shoulder   BACTERIAL VAGINITIS 12/24/2007   BREAST MASS, LEFT 07/15/2008   DEPRESSION 12/24/2007   DIVERTICULITIS OF COLON 04/21/2010   History of migraine    none in years   HYPERLIPIDEMIA 05/31/2007   HYPERTENSION 05/31/2007   Hypertension    Lesion of bladder    MENOPAUSE, EARLY 01/04/2010   Microscopic hematuria 01/03/2009   PONV (postoperative nausea and vomiting)    Past Surgical History:  Procedure Laterality Date   ABDOMINAL HYSTERECTOMY  1990   COLONOSCOPY  2018   CYSTOSCOPY WITH BIOPSY N/A 03/01/2020   Procedure: CYSTOSCOPY WITH BIOPSY, FULGERATION BLADDER;  Surgeon: Robley Fries, MD;  Location: [place];  Service: Urology;  Laterality: N/A;   EXPLORATORY LAPAROTOMY  as teenager   OOPHORECTOMY Bilateral 1990   POLYPECTOMY  2018   TONSILLECTOMY  as child   Patient Active Problem List   Diagnosis Date Noted   Right lumbar radiculopathy 05/25/2022   Vitamin D deficiency 05/08/2022   Preop exam for internal medicine 08/01/2021   Lactose intolerance 07/13/2020   Diarrhea 04/28/2020   Left knee pain 06/16/2019   Hyperglycemia 04/28/2019   Baker's cyst, left 02/27/2019   Arthritis of hand, 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   Encounter for well adult exam with abnormal findings 04/09/2017   Right-sided low back pain without sciatica 02/20/2016    Nonallopathic lesion-rib cage 07/11/2015   Nonallopathic lesion of cervical region 07/11/2015   Nonallopathic lesion of thoracic region 07/11/2015   Biceps tendinitis on left 06/17/2015   Left rotator cuff tear 04/15/2015   Bilateral shoulder pain 04/06/2015   Allergic rhinitis, cause unspecified 02/05/2014   Raynaud phenomenon 01/22/2013   Abnormal TSH 01/22/2013   Muscle strain, lower leg 06/18/2012   Lumbar radicular pain 04/24/2011   Colon polyps 01/12/2011   DIVERTICULITIS OF COLON 04/21/2010   MENOPAUSE, EARLY 01/04/2010   Microhematuria 01/03/2009   ANXIETY 12/24/2007   Depression 12/24/2007   HLD (hyperlipidemia) 05/31/2007   MIGRAINE HEADACHE 05/31/2007   Essential hypertension 05/31/2007    PCP: Cathlean Cower MD  REFERRING PROVIDER: Duffy Rhody MD  REFERRING DIAG: M54.16 lumbar back pain with radiculopathy affecting right lower extremity  Rationale for Evaluation and Treatment Rehabilitation  THERAPY DIAG: lumbar radiculopathy; weakness  ONSET DATE: 03/17/22  SUBJECTIVE:  SUBJECTIVE STATEMENT: That shot really helped.  Mild back pain only.  Did fine after last session.  The prop on my elbows hurt my neck but was OK on my back.    PERTINENT HISTORY:  Left TKR November painful/slow to recover used a cane for 2-3 weeks Has a standing stepper at home Had PT for shoulder with [facility]   PAIN:  Are you having pain? yes NPRS scale: 2/10 Pain location: right low back, buttock, right anterior thigh to knee level   Aggravating factors: bending over to feed cats; sitting;rising sit to stand; difficulty sleeping  Relieving factors: supine lying; steroid shot; Meloxicam; sidelying  PRECAUTIONS: None  WEIGHT BEARING RESTRICTIONS: No  FALLS:  Has patient fallen in last 6 months? No  Did  fall a few times after TKR knee give way (uses rails to descend steps)  LIVING ENVIRONMENT: Lives with: lives alone Lives in: House/apartment Stairs: Yes: External: 3 steps; on right going up and on left going up;  downhill at [place] is tricky  OCCUPATION: Engineer, site (schedule commercials)  PLOF: Independent  PATIENT GOALS: get back to park walking; be able to walk faster; be out of back pain   OBJECTIVE:   DIAGNOSTIC FINDINGS 05/30/22:  L1-L2:  No canal or foraminal stenosis.   L2-L3: Disc bulge with endplate osteophytic ridging eccentric to the right. Mild canal stenosis. Narrowing of the right subarticular recess. Moderate right foraminal stenosis. No left foraminal stenosis.   L3-L4: Disc bulge with endplate osteophytic ridging eccentric to the left. No canal stenosis. Minor right foraminal stenosis. Moderate left foraminal stenosis.   L4-L5: Anterolisthesis with uncovering of disc bulge. Facet arthropathy with ligamentum flavum infolding. Moderate canal stenosis. Partial effacement of subarticular recesses.   L5-S1: Left greater than right facet arthropathy. No canal or foraminal stenosis.   IMPRESSION: Multilevel degenerative changes as detailed above. Right subarticular recess and foraminal narrowing at L2-L3. Subarticular recess narrowing also present at L4-L5.        PATIENT SURVEYS:  Eval:  FOTO 58%   COGNITION:  Overall cognitive status: Within functional limits for tasks assessed     SENSATION: WFL  MUSCLE LENGTH: Hamstrings: Right 60 deg; Left 60 deg Thomas test: Right 5 deg; Left 5 deg  POSTURE: decreased lumbar lordosis  PALPATION: No tender points in lumbar paraspinals and gluteals  LUMBAR ROM:  Prone not comfortable  AROM eval  Flexion 35  Extension 20  Right lateral flexion 30  Left lateral flexion 30  Right rotation   Left rotation    (Blank rows = not tested)  TRUNK STRENGTH:   Eval:  Decreased activation of transverse  abdominus muscles; abdominals 4-/5; decreased activation of lumbar multifidi; trunk extensors 4-/5  LOWER EXTREMITY ROM:    Eval:  good LE ROM including left TKR side with full knee flexion, although weakness left quad making terminal knee extension difficult  LOWER EXTREMITY MMT:    MMT Right eval Left eval  Hip flexion 4 4  Hip extension 4- 4  Hip abduction 4- 4  Hip adduction    Hip internal rotation    Hip external rotation    Knee flexion    Knee extension 4 3+  Ankle dorsiflexion 4 4  Ankle plantarflexion 4 4  Ankle inversion    Ankle eversion     (Blank rows = not tested)  LUMBAR SPECIAL TESTS:  Eval:  Straight leg raise test: Negative, Slump test: Negative, and Single leg stance test: Positive  right side 5 sec  with lateral trunk lean; left side 3 sec with excessive knee flexion   FUNCTIONAL TESTS:  Eval:  Timed up and go (TUG): 16 sec   GAIT: Comments: no assistive device, moderate limp with decreased stance time on left; right pelvic drop    TODAY'S TREATMENT:  10/25: Nustep level 5 x6 min with PT present to discuss status Lumbar rotation 10x  Supine neural mob: hands behind knee kicks 10x right/left Supine piriformis stretch 5x 10 sec hold, using towel on left to assist  Supine hand to opp knee isometric to activate transverse abdominus muscles 10x 5 sec hold Sidelying clam 10x right/left Sit to stand 5x (some LBP) Retro stepping 30 sec each side (knee hyperextension) 2nd step hip flexor stretch with arm elevation 10x right/left 6 inch step ups on left 5x (knee hyperextension) Cable resisted backward walk 10# 8x Therapeutic activity:  sit to stand, ascending steps, walking, standing   DATE: 07/20/2022 Nustep level 3 x6 min with PT present to discuss status Seated purple ball press into thighs for TA contraction 2x10 each Seated hip adduction ball squeeze and hip abduction with red band 2x10 Seated hamstring curl with red tband 2x10 bilat Sit to/from  stand standing on black foam x10 Hooklying posterior pelvic tilt 2x10 Lower trunk rotation 3x10 sec (with cuing to only go as far as comfortable) Prone on elbows for 2 min Prone press up 2x10 Seated shoulder ER and horizontal abduction with red tband.  2x10 bilat   DATE: 07/17/22 Initial HEP    PATIENT EDUCATION:  Education details: plan of care Person educated: Patient Education method: Explanation Education comprehension: verbalized understanding   HOME EXERCISE PROGRAM: Access Code: [email or web address] URL: [email or web address] Date: 07/25/2022 Prepared by: Ruben Im  Exercises - Seated Abdominal Set  - 5 x daily - 7 x weekly - 1 sets - 8 reps - 5 hold - Sit to Stand  - 1 x daily - 7 x weekly - 1 sets - 10 reps - Sidelying Hip Abduction  - 1 x daily - 7 x weekly - 1 sets - 10 reps - Supine Posterior Pelvic Tilt  - 1 x daily - 7 x weekly - 2 sets - 10 reps - Static Prone on Elbows  - 1 x daily - 7 x weekly - 1 sets - 1 reps - 2 min hold - Prone Press Up  - 1 x daily - 7 x weekly - 2 sets - 10 reps - Prone Hip Extension  - 1 x daily - 7 x weekly - 2 sets - 10 reps - Clamshell  - 1 x daily - 7 x weekly - 10 reps - Standing Knee Flexion Stretch on Step  - 1 x daily - 7 x weekly - 2 sets - 5 reps - Forward Step Up  - 1 x daily - 7 x weekly - 1 sets - 10 reps  ASSESSMENT:  CLINICAL IMPRESSION: "I like standing better than lying."  Verbal cues to avoid left knee hyperextension in standing but difficulty controlling.  Knee instability will affect the low back in loaded positions.   Verbal and tactile cues for proper recruitment of transverse abdominus and gluteal muscles without compensatory strategies.  Back pain no worse/maybe a little better per patient report at end of session.   OBJECTIVE IMPAIRMENTS: decreased mobility, difficulty walking, decreased ROM, decreased strength, impaired perceived functional ability, and pain.   ACTIVITY LIMITATIONS: carrying,  lifting, bending, sitting, standing, sleeping, stairs, and locomotion level  PARTICIPATION LIMITATIONS: cleaning,  laundry, community activity, and occupation  PERSONAL FACTORS: Profession, Time since onset of injury/illness/exacerbation, and 1 comorbidity: multi region OA  are also affecting patient's functional outcome.   REHAB POTENTIAL: Good  CLINICAL DECISION MAKING: Stable/uncomplicated  EVALUATION COMPLEXITY: Low   GOALS: Goals reviewed with patient? Yes  SHORT TERM GOALS: Target date: 08/14/2022  The patient will demonstrate knowledge of basic self care strategies and exercises to promote healing   Baseline: Goal status: IN PROGRESS  2.  The patient will report a 30% improvement in pain levels with functional activities   Baseline:  Goal status: INITIAL  3.  The patient will have improved trunk flexor and extensor muscle strength to at least 4/5 needed for lifting medium weight objects such as grocery bags  Baseline:  Goal status: INITIAL  4.  The patient will have improved hip strength to at least 4/5 needed for standing, walking longer distances and descending stairs at home and in the community  Baseline:  Goal status: INITIAL  5.  The patient will have improved gait stamina and speed needed to ambulate 600 feet in 6 minutes  Baseline:  Goal status: INITIAL   LONG TERM GOALS: Target date:  09/11/2022  The patient will be independent in a safe self progression of a home exercise program to promote further recovery of function   Baseline:  Goal status: INITIAL  2.  The patient will report a 75% improvement in pain levels with functional activities which are currently difficult including sitting, walking, bending Baseline:  Goal status: INITIAL  3.  The patient will have improved trunk flexor and extensor muscle strength to at least 4+/5 needed for lifting medium weight objects such as laundry and luggage  Baseline:  Goal status: INITIAL  4.  The patient  will have improved hip strength to at least 4+/5 needed for standing, walking longer distances and descending stairs at home and in the community  Baseline:  Goal status: INITIAL  5.  The patient will have improved FOTO score to   68%    indicating improved function with less pain  Baseline:  Goal status: INITIAL  6.  The patient will be able to resume walking at [employer] 1 mile Baseline:  Goal status: INITIAL   PLAN: PT FREQUENCY: 2x/week  PT DURATION: 8 weeks  PLANNED INTERVENTIONS: Therapeutic exercises, Therapeutic activity, Neuromuscular re-education, Patient/Family education, Self Care, Joint mobilization, Aquatic Therapy, Dry Needling, Electrical stimulation, Spinal manipulation, Spinal mobilization, Cryotherapy, Moist heat, Taping, Traction, Ultrasound, Ionotophoresis '4mg'$ /ml Dexamethasone, Manual therapy, and Re-evaluation.  PLAN FOR NEXT SESSION: review and progress HEP; progress transverse abdominus activation;  glute strengthening;  resisted walking; watch for knee hyperextension; quad motor control  Ruben Im, PT 07/25/22 8:54 AM Phone: [phone] Fax: [phone]    [facility] [address] [place], [place] [address] Phone # [phone] Fax [phone]

## 2022-07-27 ENCOUNTER — Encounter: Payer: Self-pay | Admitting: Rehabilitative and Restorative Service Providers"

## 2022-07-27 ENCOUNTER — Ambulatory Visit: Payer: Medicare Other | Admitting: Rehabilitative and Restorative Service Providers"

## 2022-07-27 DIAGNOSIS — M6281 Muscle weakness (generalized): Secondary | ICD-10-CM

## 2022-07-27 DIAGNOSIS — M5416 Radiculopathy, lumbar region: Secondary | ICD-10-CM | POA: Diagnosis not present

## 2022-07-27 NOTE — Therapy (Signed)
OUTPATIENT PHYSICAL THERAPY TREATMENT NOTE   Patient Name: Barbara Gross MRN: [id number] DOB:1956/07/22, 66 y.o., female Today's Date: 07/27/2022   PT End of Session - 07/27/22 0800     Visit Number 4    Date for PT Re-Evaluation 09/11/22    Authorization Type BCBS Medicare    Progress Note Due on Visit 10    PT Start Time 0759    PT Stop Time 0840    PT Time Calculation (min) 41 min    Activity Tolerance Patient tolerated treatment well    Behavior During Therapy [facility] for tasks assessed/performed             Past Medical History:  Diagnosis Date   ABDOMINAL PAIN, LOWER 05/22/2010   Allergy    pollen    ANXIETY 12/24/2007   Arthritis    back , shoulder   BACTERIAL VAGINITIS 12/24/2007   BREAST MASS, LEFT 07/15/2008   DEPRESSION 12/24/2007   DIVERTICULITIS OF COLON 04/21/2010   History of migraine    none in years   HYPERLIPIDEMIA 05/31/2007   HYPERTENSION 05/31/2007   Hypertension    Lesion of bladder    MENOPAUSE, EARLY 01/04/2010   Microscopic hematuria 01/03/2009   PONV (postoperative nausea and vomiting)    Past Surgical History:  Procedure Laterality Date   ABDOMINAL HYSTERECTOMY  1990   COLONOSCOPY  2018   CYSTOSCOPY WITH BIOPSY N/A 03/01/2020   Procedure: CYSTOSCOPY WITH BIOPSY, FULGERATION BLADDER;  Surgeon: Robley Fries, MD;  Location: [facility];  Service: Urology;  Laterality: N/A;   EXPLORATORY LAPAROTOMY  as teenager   OOPHORECTOMY Bilateral 1990   POLYPECTOMY  2018   TONSILLECTOMY  as child   Patient Active Problem List   Diagnosis Date Noted   Right lumbar radiculopathy 05/25/2022   Vitamin D deficiency 05/08/2022   Preop exam for internal medicine 08/01/2021   Lactose intolerance 07/13/2020   Diarrhea 04/28/2020   Left knee pain 06/16/2019   Hyperglycemia 04/28/2019   Baker's cyst, left 02/27/2019   Arthritis of hand, 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   Encounter for well adult exam with abnormal findings 04/09/2017    Right-sided low back pain without sciatica 02/20/2016   Nonallopathic lesion-rib cage 07/11/2015   Nonallopathic lesion of cervical region 07/11/2015   Nonallopathic lesion of thoracic region 07/11/2015   Biceps tendinitis on left 06/17/2015   Left rotator cuff tear 04/15/2015   Bilateral shoulder pain 04/06/2015   Allergic rhinitis, cause unspecified 02/05/2014   Raynaud phenomenon 01/22/2013   Abnormal TSH 01/22/2013   Muscle strain, lower leg 06/18/2012   Lumbar radicular pain 04/24/2011   Colon polyps 01/12/2011   DIVERTICULITIS OF COLON 04/21/2010   MENOPAUSE, EARLY 01/04/2010   Microhematuria 01/03/2009   ANXIETY 12/24/2007   Depression 12/24/2007   HLD (hyperlipidemia) 05/31/2007   MIGRAINE HEADACHE 05/31/2007   Essential hypertension 05/31/2007    PCP: Cathlean Cower MD  REFERRING PROVIDER: Duffy Rhody MD  REFERRING DIAG: M54.16 lumbar back pain with radiculopathy affecting right lower extremity  Rationale for Evaluation and Treatment Rehabilitation  THERAPY DIAG: lumbar radiculopathy; weakness  ONSET DATE: 03/17/22  SUBJECTIVE:  SUBJECTIVE STATEMENT: Pt reports that she has been having some soreness and had a difficult time sleeping last night.   PERTINENT HISTORY:  Left TKR November painful/slow to recover used a cane for 2-3 weeks Has a standing stepper at home Had PT for shoulder with [facility]   PAIN:  Are you having pain? yes NPRS scale: 3-4/10 Pain location: right low back, buttock, right anterior thigh to knee level   Aggravating factors: bending over to feed cats; sitting;rising sit to stand; difficulty sleeping  Relieving factors: supine lying; steroid shot; Meloxicam; sidelying  PRECAUTIONS: None  WEIGHT BEARING RESTRICTIONS: No  FALLS:  Has patient fallen in last  6 months? 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   L2-L3: Disc bulge with endplate osteophytic ridging eccentric to the right. Mild canal stenosis. Narrowing of the right subarticular recess. Moderate right foraminal stenosis. No left foraminal stenosis.   L3-L4: Disc bulge with endplate osteophytic ridging eccentric to the left. No canal stenosis. Minor right foraminal stenosis. Moderate left foraminal stenosis.   L4-L5: Anterolisthesis with uncovering of disc bulge. Facet arthropathy with ligamentum flavum infolding. Moderate canal stenosis. Partial effacement of subarticular recesses.   L5-S1: Left greater than right facet arthropathy. No canal or foraminal stenosis.   IMPRESSION: Multilevel degenerative changes as detailed above. Right subarticular recess and foraminal narrowing at L2-L3. Subarticular recess narrowing also present at L4-L5.        PATIENT SURVEYS:  Eval:  FOTO 58%   COGNITION:  Overall cognitive status: Within functional limits for tasks assessed     SENSATION: WFL  MUSCLE LENGTH: Hamstrings: Right 60 deg; Left 60 deg Thomas test: Right 5 deg; Left 5 deg  POSTURE: decreased lumbar lordosis  PALPATION: No tender points in lumbar paraspinals and gluteals  LUMBAR ROM:  Prone not comfortable  AROM eval  Flexion 35  Extension 20  Right lateral flexion 30  Left lateral flexion 30  Right rotation   Left rotation    (Blank rows = not tested)  TRUNK STRENGTH:   Eval:  Decreased activation  of transverse abdominus muscles; abdominals 4-/5; decreased activation of lumbar multifidi; trunk extensors 4-/5  LOWER EXTREMITY ROM:    Eval:  good LE ROM including left TKR side with full knee flexion, although weakness left quad making terminal knee extension difficult  LOWER EXTREMITY MMT:    MMT Right eval Left eval  Hip flexion 4 4  Hip extension 4- 4  Hip abduction 4- 4  Hip adduction    Hip internal rotation    Hip external rotation    Knee flexion    Knee extension 4 3+  Ankle dorsiflexion 4 4  Ankle plantarflexion 4 4  Ankle inversion    Ankle eversion     (Blank rows = not tested)  LUMBAR SPECIAL TESTS:  Eval:  Straight leg raise test: Negative, Slump test: Negative, and Single leg stance test: Positive  right side 5 sec with lateral trunk lean; left side 3 sec with excessive knee flexion  FUNCTIONAL TESTS:  Eval:  Timed up and go (TUG): 16 sec   GAIT: Comments: no assistive device, moderate limp with decreased stance time on left; right pelvic drop    TODAY'S TREATMENT:   DATE: 07/27/2022 Nustep level 5 x6 min with PT present to discuss status Seated piriformis stretch with a towel to help support leg 2x20 sec bilat Seated hamstring stretch 2x20 sec bilat Seated hip adduction ball squeeze and hip abduction with red band 2x10 Seated hamstring curl with red tband 2x10 bilat Decompression pose in hooklying x1 min Hooklying posterior pelvic tilt (PPT) 2x10 Hooklying PPT with marching 2x10 Sidelying clamshells 2x10 bilat Prone press-up 2x10 Seated shoulder horizontal abduction with red tband 2x10   DATE: 07/25/2022 Nustep level 5 x6 min with PT present to discuss status Lumbar rotation 10x  Supine neural mob: hands behind knee kicks 10x right/left Supine piriformis stretch 5x 10 sec hold, using towel on left to assist  Supine hand to opp knee isometric to activate transverse abdominus muscles 10x 5 sec hold Sidelying clam 10x right/left Sit to stand  5x (some LBP) Retro stepping 30 sec each side (knee hyperextension) 2nd step hip flexor stretch with arm elevation 10x right/left 6 inch step ups on left 5x (knee hyperextension) Cable resisted backward walk 10# 8x Therapeutic activity:  sit to stand, ascending steps, walking, standing   DATE: 07/20/2022 Nustep level 3 x6 min with PT present to discuss status Seated purple ball press into thighs for TA contraction 2x10 each Seated hip adduction ball squeeze and hip abduction with red band 2x10 Seated hamstring curl with red tband 2x10 bilat Sit to/from stand standing on black foam x10 Hooklying posterior pelvic tilt 2x10 Lower trunk rotation 3x10 sec (with cuing to only go as far as comfortable) Prone on elbows for 2 min Prone press up 2x10 Seated shoulder ER and horizontal abduction with red tband.  2x10 bilat    PATIENT EDUCATION:  Education details: plan of care Person educated: Patient Education method: Explanation Education comprehension: verbalized understanding   HOME EXERCISE PROGRAM: Access Code: [email or web address] URL: [email or web address] Date: 07/25/2022 Prepared by: Ruben Im  Exercises - Seated Abdominal Set  - 5 x daily - 7 x weekly - 1 sets - 8 reps - 5 hold - Sit to Stand  - 1 x daily - 7 x weekly - 1 sets - 10 reps - Sidelying Hip Abduction  - 1 x daily - 7 x weekly - 1 sets - 10 reps - Supine Posterior Pelvic Tilt  - 1 x daily - 7 x weekly - 2 sets - 10 reps - Static Prone on Elbows  - 1 x daily - 7 x weekly - 1 sets - 1 reps - 2 min hold - Prone Press Up  - 1 x daily - 7 x weekly - 2 sets - 10 reps - Prone Hip Extension  - 1 x daily - 7 x weekly - 2 sets - 10 reps - Clamshell  - 1 x daily - 7 x weekly - 10 reps - Standing Knee Flexion Stretch on Step  - 1 x daily - 7 x weekly - 2 sets - 5 reps - Forward Step Up  - 1 x daily - 7 x weekly - 1 sets - 10 reps  ASSESSMENT:  CLINICAL IMPRESSION: Ms Barbara Gross presents to skilled PT reporting that  she has had some soreness since last PT visit.  Pt able to progress through PT session with minimal cuing for technique, especially  during sidelying clamshells.  Pt able to progress with session and admits that she knows that she has had weakness for a long time and still has some weakness from her left knee surgery.  Pt states that extension exercises, like prone press-up, continue to help with decreased back pain.  OBJECTIVE IMPAIRMENTS: decreased mobility, difficulty walking, decreased ROM, decreased strength, impaired perceived functional ability, and pain.   ACTIVITY LIMITATIONS: carrying, lifting, bending, sitting, standing, sleeping, stairs, and locomotion level  PARTICIPATION LIMITATIONS: cleaning, laundry, community activity, and occupation  PERSONAL FACTORS: Profession, Time since onset of injury/illness/exacerbation, and 1 comorbidity: multi region OA  are also affecting patient's functional outcome.   REHAB POTENTIAL: Good  CLINICAL DECISION MAKING: Stable/uncomplicated  EVALUATION COMPLEXITY: Low   GOALS: Goals reviewed with patient? Yes  SHORT TERM GOALS: Target date: 08/14/2022  The patient will demonstrate knowledge of basic self care strategies and exercises to promote healing   Baseline: Goal status: IN PROGRESS  2.  The patient will report a 30% improvement in pain levels with functional activities   Baseline:  Goal status: INITIAL  3.  The patient will have improved trunk flexor and extensor muscle strength to at least 4/5 needed for lifting medium weight objects such as grocery bags  Baseline:  Goal status: INITIAL  4.  The patient will have improved hip strength to at least 4/5 needed for standing, walking longer distances and descending stairs at home and in the community  Baseline:  Goal status: IN PROGRESS  5.  The patient will have improved gait stamina and speed needed to ambulate 600 feet in 6 minutes  Baseline:  Goal status: IN PROGRESS   LONG  TERM GOALS: Target date:  09/11/2022  The patient will be independent in a safe self progression of a home exercise program to promote further recovery of function   Baseline:  Goal status: INITIAL  2.  The patient will report a 75% improvement in pain levels with functional activities which are currently difficult including sitting, walking, bending Baseline:  Goal status: INITIAL  3.  The patient will have improved trunk flexor and extensor muscle strength to at least 4+/5 needed for lifting medium weight objects such as laundry and luggage  Baseline:  Goal status: INITIAL  4.  The patient will have improved hip strength to at least 4+/5 needed for standing, walking longer distances and descending stairs at home and in the community  Baseline:  Goal status: INITIAL  5.  The patient will have improved FOTO score to   68%    indicating improved function with less pain  Baseline:  Goal status: INITIAL  6.  The patient will be able to resume walking at [employer] 1 mile Baseline:  Goal status: INITIAL   PLAN: PT FREQUENCY: 2x/week  PT DURATION: 8 weeks  PLANNED INTERVENTIONS: Therapeutic exercises, Therapeutic activity, Neuromuscular re-education, Patient/Family education, Self Care, Joint mobilization, Aquatic Therapy, Dry Needling, Electrical stimulation, Spinal manipulation, Spinal mobilization, Cryotherapy, Moist heat, Taping, Traction, Ultrasound, Ionotophoresis '4mg'$ /ml Dexamethasone, Manual therapy, and Re-evaluation.  PLAN FOR NEXT SESSION: review and progress HEP; progress transverse abdominus activation;  glute strengthening;  resisted walking; watch for knee hyperextension; quad motor control   Juel Burrow, PT 07/27/22 8:41 AM     [facility] [address], [place], [place] [address] Phone # [phone] Fax [phone]

## 2022-08-01 ENCOUNTER — Ambulatory Visit (AMBULATORY_SURGERY_CENTER): Payer: Self-pay

## 2022-08-01 VITALS — Ht 62.0 in | Wt 167.0 lb

## 2022-08-01 DIAGNOSIS — Z86010 Personal history of colonic polyps: Secondary | ICD-10-CM

## 2022-08-01 MED ORDER — NA SULFATE-K SULFATE-MG SULF 17.5-3.13-1.6 GM/177ML PO SOLN: 1.0000 | Freq: Once | ORAL | 0 refills | Status: AC

## 2022-08-01 NOTE — Progress Notes (Signed)
  No egg or soy allergy known to patient  No issues known to pt with past sedation with any surgeries or procedures----other than PONV Patient denies ever being told they had issues or difficulty with intubation ----other then PONV No FH of Malignant Hyperthermia Pt is not on diet pills Pt is not on home 02  Pt is not on blood thinners  Pt denies issues with constipation;  No A fib or A flutter Have any cardiac testing pending--NO Pt instructed to use [email or web address] or GoodRx for a price reduction on prep   Insurance verified during PV appt=BCBS Blue Medicare  Patient's chart reviewed by Osvaldo Angst CNRA prior to previsit and patient appropriate for the [place].  Previsit completed and red dot placed by patient's name on their procedure day (on provider's schedule).    GoodRx coupon given to patient during PV appt;

## 2022-08-03 ENCOUNTER — Encounter: Payer: Self-pay | Admitting: Rehabilitative and Restorative Service Providers"

## 2022-08-03 ENCOUNTER — Ambulatory Visit: Payer: Medicare Other | Attending: Neurosurgery | Admitting: Rehabilitative and Restorative Service Providers"

## 2022-08-03 DIAGNOSIS — M5416 Radiculopathy, lumbar region: Secondary | ICD-10-CM | POA: Diagnosis not present

## 2022-08-03 DIAGNOSIS — M6281 Muscle weakness (generalized): Secondary | ICD-10-CM | POA: Insufficient documentation

## 2022-08-03 NOTE — Therapy (Signed)
OUTPATIENT PHYSICAL THERAPY TREATMENT NOTE   Patient Name: Barbara Gross MRN: [id number] DOB:[birth year], 66 y.o., female Today's Date: 08/03/2022   PT End of Session - 08/03/22 0801     Visit Number 5    Date for PT Re-Evaluation 09/11/22    Authorization Type [employer] Medicare    Progress Note Due on Visit 10    PT Start Time 0759    PT Stop Time 0840    PT Time Calculation (min) 41 min    Activity Tolerance Patient tolerated treatment well    Behavior During Therapy [facility] for tasks assessed/performed             Past Medical History:  Diagnosis Date   ABDOMINAL PAIN, LOWER 05/22/2010   Allergy    pollen    ANXIETY 12/24/2007   on meds   Arthritis    back , shoulder   BACTERIAL VAGINITIS 12/24/2007   BREAST MASS, LEFT 07/15/2008   DEPRESSION 12/24/2007   on meds   DIVERTICULITIS OF COLON 04/21/2010   History of migraine    none in years   [place] 05/31/2007   on meds   HYPERTENSION 05/31/2007   Hypertension    Lesion of bladder    MENOPAUSE, EARLY 01/04/2010   Microscopic hematuria 01/03/2009   PONV (postoperative nausea and vomiting)    Past Surgical History:  Procedure Laterality Date   ABDOMINAL HYSTERECTOMY  1990   COLONOSCOPY  2017   JMP-MAC-good prep-tics-int hems-HPP x 1/TA x 1   CYSTOSCOPY WITH BIOPSY N/A 03/01/2020   Procedure: CYSTOSCOPY WITH BIOPSY, FULGERATION BLADDER;  Surgeon: Robley Fries, MD;  Location: [place];  Service: Urology;  Laterality: N/A;   DILATION AND CURETTAGE OF UTERUS     fibroid   EXPLORATORY LAPAROTOMY     as a teenager- may have taken appendix- pt unsure   OOPHORECTOMY Bilateral 1990   POLYPECTOMY  2017   HPP x 1/ TA x 1   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   TONSILLECTOMY  as child   Patient Active Problem List   Diagnosis Date Noted   Right lumbar radiculopathy 05/25/2022   Vitamin D deficiency 05/08/2022   Preop exam for internal medicine 08/01/2021   Lactose intolerance  07/13/2020   Diarrhea 04/28/2020   Left knee pain 06/16/2019   Hyperglycemia 04/28/2019   Baker's cyst, left 02/27/2019   Arthritis of hand, 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   Encounter for well adult exam with abnormal findings 04/09/2017   Right-sided low back pain without sciatica 02/20/2016   Nonallopathic lesion-rib cage 07/11/2015   Nonallopathic lesion of cervical region 07/11/2015   Nonallopathic lesion of thoracic region 07/11/2015   Biceps tendinitis on left 06/17/2015   Left rotator cuff tear 04/15/2015   Bilateral shoulder pain 04/06/2015   Allergic rhinitis, cause unspecified 02/05/2014   Raynaud phenomenon 01/22/2013   Abnormal TSH 01/22/2013   Muscle strain, lower leg 06/18/2012   Lumbar radicular pain 04/24/2011   Colon polyps 01/12/2011   DIVERTICULITIS OF COLON 04/21/2010   MENOPAUSE, EARLY 01/04/2010   Microhematuria 01/03/2009   ANXIETY 12/24/2007   Depression 12/24/2007   HLD (hyperlipidemia) 05/31/2007   MIGRAINE HEADACHE 05/31/2007   Essential hypertension 05/31/2007    PCP: Cathlean Cower MD  REFERRING PROVIDER: Duffy Rhody MD  REFERRING DIAG: M54.16 lumbar back pain with radiculopathy affecting right lower extremity  Rationale for Evaluation and Treatment Rehabilitation  THERAPY DIAG: lumbar radiculopathy; weakness  ONSET DATE: 03/17/22  SUBJECTIVE:  SUBJECTIVE STATEMENT: Pt denies pain and states that she is feels approx 80% better since starting PT.  She states "I'm just worried about when that steroid shot wears off."  PERTINENT HISTORY:  Left TKR November painful/slow to recover used a cane for 2-3 weeks Has a standing stepper at home Had PT for shoulder with [facility]   PAIN:  Are you having pain? no NPRS scale: 0/10 Pain location: right low back, buttock, right  anterior thigh to knee level   Aggravating factors: bending over to feed cats; sitting;rising sit to stand; difficulty sleeping  Relieving factors: supine lying; steroid shot; Meloxicam; sidelying  PRECAUTIONS: None  WEIGHT BEARING RESTRICTIONS: No  FALLS:  Has patient fallen in last 6 months? 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   L2-L3: Disc bulge with endplate osteophytic ridging eccentric to the right. Mild canal stenosis. Narrowing of the right subarticular recess. Moderate right foraminal stenosis. No left foraminal stenosis.   L3-L4: Disc bulge with endplate osteophytic ridging eccentric to the left. No canal stenosis. Minor right foraminal stenosis. Moderate left foraminal stenosis.   L4-L5: Anterolisthesis with uncovering of disc bulge. Facet arthropathy with ligamentum flavum infolding. Moderate canal stenosis. Partial effacement of subarticular recesses.   L5-S1: Left greater than right facet arthropathy. No canal or foraminal stenosis.   IMPRESSION: Multilevel degenerative changes as detailed above. Right subarticular recess and foraminal narrowing at L2-L3. Subarticular recess narrowing also present at L4-L5.        PATIENT SURVEYS:  Eval:  FOTO 58%   COGNITION:  Overall cognitive status: Within functional limits for tasks assessed     SENSATION: WFL  MUSCLE LENGTH: Hamstrings: Right 60 deg; Left 60 deg Thomas test: Right 5 deg; Left 5 deg  POSTURE: decreased lumbar lordosis  PALPATION: No  tender points in lumbar paraspinals and gluteals  LUMBAR ROM:  Prone not comfortable  AROM eval  Flexion 35  Extension 20  Right lateral flexion 30  Left lateral flexion 30  Right rotation   Left rotation    (Blank rows = not tested)  TRUNK STRENGTH:   Eval:  Decreased activation of transverse abdominus muscles; abdominals 4-/5; decreased activation of lumbar multifidi; trunk extensors 4-/5  LOWER EXTREMITY ROM:    Eval:  good LE ROM including left TKR side with full knee flexion, although weakness left quad making terminal knee extension difficult  LOWER EXTREMITY MMT:    MMT Right eval Left eval  Hip flexion 4 4  Hip extension 4- 4  Hip abduction 4- 4  Hip adduction    Hip internal rotation    Hip external rotation    Knee flexion    Knee extension 4 3+  Ankle dorsiflexion 4 4  Ankle plantarflexion 4 4  Ankle inversion    Ankle eversion     (Blank rows = not tested)  LUMBAR SPECIAL TESTS:  Eval:  Straight leg raise test: Negative, Slump test: Negative, and Single leg stance test: Positive  right side 5 sec with lateral trunk lean;  left side 3 sec with excessive knee flexion   FUNCTIONAL TESTS:  Eval:  Timed up and go (TUG): 16 sec   08/03/2022: 6 minute walk test:  1073 ft without pain  GAIT: Comments: no assistive device, moderate limp with decreased stance time on left; right pelvic drop    TODAY'S TREATMENT:   DATE: 08/03/2022: Nustep level 5 x6 min with PT present to discuss status Seated piriformis stretch with a towel to help support leg 2x20 sec bilat Seated hamstring stretch 2x20 sec bilat Seated shoulder ER and horizontal abduction with red tband 2x10 Seated hip abduction with red tband 2x10 Sidelying clamshells with red tband 2x10 bilat Supine posterior pelvic tilt with marching and red tband around knees 2x10 bilat Decompression pose in hooklying x1 min Prone press-up 2x10 6 minute walk test: 1073 ft without any pain reported   DATE:  07/27/2022 Nustep level 5 x6 min with PT present to discuss status Seated piriformis stretch with a towel to help support leg 2x20 sec bilat Seated hamstring stretch 2x20 sec bilat Seated hip adduction ball squeeze and hip abduction with red band 2x10 Seated hamstring curl with red tband 2x10 bilat Decompression pose in hooklying x1 min Hooklying posterior pelvic tilt (PPT) 2x10 Hooklying PPT with marching 2x10 Sidelying clamshells 2x10 bilat Prone press-up 2x10 Seated shoulder horizontal abduction with red tband 2x10   DATE: 07/25/2022 Nustep level 5 x6 min with PT present to discuss status Lumbar rotation 10x  Supine neural mob: hands behind knee kicks 10x right/left Supine piriformis stretch 5x 10 sec hold, using towel on left to assist  Supine hand to opp knee isometric to activate transverse abdominus muscles 10x 5 sec hold Sidelying clam 10x right/left Sit to stand 5x (some LBP) Retro stepping 30 sec each side (knee hyperextension) 2nd step hip flexor stretch with arm elevation 10x right/left 6 inch step ups on left 5x (knee hyperextension) Cable resisted backward walk 10# 8x Therapeutic activity:  sit to stand, ascending steps, walking, standing    PATIENT EDUCATION:  Education details: plan of care Person educated: Patient Education method: Explanation Education comprehension: verbalized understanding   HOME EXERCISE PROGRAM: Access Code: [email or web address] URL: [email or web address] Date: 07/25/2022 Prepared by: Ruben Im  Exercises - Seated Abdominal Set  - 5 x daily - 7 x weekly - 1 sets - 8 reps - 5 hold - Sit to Stand  - 1 x daily - 7 x weekly - 1 sets - 10 reps - Sidelying Hip Abduction  - 1 x daily - 7 x weekly - 1 sets - 10 reps - Supine Posterior Pelvic Tilt  - 1 x daily - 7 x weekly - 2 sets - 10 reps - Static Prone on Elbows  - 1 x daily - 7 x weekly - 1 sets - 1 reps - 2 min hold - Prone Press Up  - 1 x daily - 7 x weekly - 2 sets - 10  reps - Prone Hip Extension  - 1 x daily - 7 x weekly - 2 sets - 10 reps - Clamshell  - 1 x daily - 7 x weekly - 10 reps - Standing Knee Flexion Stretch on Step  - 1 x daily - 7 x weekly - 2 sets - 5 reps - Forward Step Up  - 1 x daily - 7 x weekly - 1 sets - 10 reps  ASSESSMENT:  CLINICAL IMPRESSION: Ms Outten presents to skilled PT with reports of no pain yesterday or today reporting  feeling better overall.  Pt is progressing with increased strengthening.  Pt is progressing towards goals, as anticipated and able to progress with exercises during session today.  Pt continues to require skilled PT to further progress towards goal related activities.  OBJECTIVE IMPAIRMENTS: decreased mobility, difficulty walking, decreased ROM, decreased strength, impaired perceived functional ability, and pain.   ACTIVITY LIMITATIONS: carrying, lifting, bending, sitting, standing, sleeping, stairs, and locomotion level  PARTICIPATION LIMITATIONS: cleaning, laundry, community activity, and occupation  PERSONAL FACTORS: Profession, Time since onset of injury/illness/exacerbation, and 1 comorbidity: multi region OA  are also affecting patient's functional outcome.   REHAB POTENTIAL: Good  CLINICAL DECISION MAKING: Stable/uncomplicated  EVALUATION COMPLEXITY: Low   GOALS: Goals reviewed with patient? Yes  SHORT TERM GOALS: Target date: 08/14/2022  The patient will demonstrate knowledge of basic self care strategies and exercises to promote healing   Baseline: Goal status: MET  2.  The patient will report a 30% improvement in pain levels with functional activities   Baseline:  Goal status: MET  3.  The patient will have improved trunk flexor and extensor muscle strength to at least 4/5 needed for lifting medium weight objects such as grocery bags  Baseline:  Goal status: IN PROGRESS  4.  The patient will have improved hip strength to at least 4/5 needed for standing, walking longer distances and  descending stairs at home and in the community  Baseline:  Goal status: IN PROGRESS  5.  The patient will have improved gait stamina and speed needed to ambulate 600 feet in 6 minutes  Baseline:  Goal status: MET on 08/03/2022   LONG TERM GOALS: Target date:  09/11/2022  The patient will be independent in a safe self progression of a home exercise program to promote further recovery of function   Baseline:  Goal status: IN PROGRESS  2.  The patient will report a 75% improvement in pain levels with functional activities which are currently difficult including sitting, walking, bending Baseline:  Goal status: IN PROGRESS  3.  The patient will have improved trunk flexor and extensor muscle strength to at least 4+/5 needed for lifting medium weight objects such as laundry and luggage  Baseline:  Goal status: INITIAL  4.  The patient will have improved hip strength to at least 4+/5 needed for standing, walking longer distances and descending stairs at home and in the community  Baseline:  Goal status: INITIAL  5.  The patient will have improved FOTO score to   68%    indicating improved function with less pain  Baseline:  Goal status: INITIAL  6.  The patient will be able to resume walking at [employer] 1 mile Baseline:  Goal status: INITIAL   PLAN: PT FREQUENCY: 2x/week  PT DURATION: 8 weeks  PLANNED INTERVENTIONS: Therapeutic exercises, Therapeutic activity, Neuromuscular re-education, Patient/Family education, Self Care, Joint mobilization, Aquatic Therapy, Dry Needling, Electrical stimulation, Spinal manipulation, Spinal mobilization, Cryotherapy, Moist heat, Taping, Traction, Ultrasound, Ionotophoresis 49m/ml Dexamethasone, Manual therapy, and Re-evaluation.  PLAN FOR NEXT SESSION: review and progress HEP; progress transverse abdominus activation;  glute strengthening;  resisted walking; watch for knee hyperextension; quad motor control   SJuel Burrow  PT 08/03/22 10:11 AM    [email or web address] [place] [address]Phone # [phone] [phone]

## 2022-08-08 ENCOUNTER — Ambulatory Visit: Payer: Medicare Other | Admitting: Rehabilitative and Restorative Service Providers"

## 2022-08-08 ENCOUNTER — Encounter: Payer: Self-pay | Admitting: Rehabilitative and Restorative Service Providers"

## 2022-08-08 DIAGNOSIS — M6281 Muscle weakness (generalized): Secondary | ICD-10-CM | POA: Diagnosis not present

## 2022-08-08 DIAGNOSIS — M5416 Radiculopathy, lumbar region: Secondary | ICD-10-CM

## 2022-08-08 NOTE — Therapy (Signed)
OUTPATIENT PHYSICAL THERAPY TREATMENT NOTE   Patient Name: Barbara Gross MRN: [id number] DOB:[birth year], 66 y.o., female Today's Date: 08/08/2022   PT End of Session - 08/08/22 0801     Visit Number 6    Date for PT Re-Evaluation 09/11/22    Authorization Type [employer] Medicare    Progress Note Due on Visit 10    PT Start Time 0759    PT Stop Time 0840    PT Time Calculation (min) 41 min    Activity Tolerance Patient tolerated treatment well    Behavior During Therapy [facility] for tasks assessed/performed             Past Medical History:  Diagnosis Date   ABDOMINAL PAIN, LOWER 05/22/2010   Allergy    pollen    ANXIETY 12/24/2007   on meds   Arthritis    back , shoulder   BACTERIAL VAGINITIS 12/24/2007   BREAST MASS, LEFT 07/15/2008   DEPRESSION 12/24/2007   on meds   DIVERTICULITIS OF COLON 04/21/2010   History of migraine    none in years   [place] 05/31/2007   on meds   HYPERTENSION 05/31/2007   Hypertension    Lesion of bladder    MENOPAUSE, EARLY 01/04/2010   Microscopic hematuria 01/03/2009   PONV (postoperative nausea and vomiting)    Past Surgical History:  Procedure Laterality Date   ABDOMINAL HYSTERECTOMY  1990   COLONOSCOPY  2017   JMP-MAC-good prep-tics-int hems-HPP x 1/TA x 1   CYSTOSCOPY WITH BIOPSY N/A 03/01/2020   Procedure: CYSTOSCOPY WITH BIOPSY, FULGERATION BLADDER;  Surgeon: Robley Fries, MD;  Location: [facility];  Service: Urology;  Laterality: N/A;   DILATION AND CURETTAGE OF UTERUS     fibroid   EXPLORATORY LAPAROTOMY     as a teenager- may have taken appendix- pt unsure   OOPHORECTOMY Bilateral 1990   POLYPECTOMY  2017   HPP x 1/ TA x 1   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   TONSILLECTOMY  as child   Patient Active Problem List   Diagnosis Date Noted   Right lumbar radiculopathy 05/25/2022   Vitamin D deficiency 05/08/2022   Preop exam for internal medicine 08/01/2021   Lactose intolerance  07/13/2020   Diarrhea 04/28/2020   Left knee pain 06/16/2019   Hyperglycemia 04/28/2019   Baker's cyst, left 02/27/2019   Arthritis of hand, 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   Encounter for well adult exam with abnormal findings 04/09/2017   Right-sided low back pain without sciatica 02/20/2016   Nonallopathic lesion-rib cage 07/11/2015   Nonallopathic lesion of cervical region 07/11/2015   Nonallopathic lesion of thoracic region 07/11/2015   Biceps tendinitis on left 06/17/2015   Left rotator cuff tear 04/15/2015   Bilateral shoulder pain 04/06/2015   Allergic rhinitis, cause unspecified 02/05/2014   Raynaud phenomenon 01/22/2013   Abnormal TSH 01/22/2013   Muscle strain, lower leg 06/18/2012   Lumbar radicular pain 04/24/2011   Colon polyps 01/12/2011   DIVERTICULITIS OF COLON 04/21/2010   MENOPAUSE, EARLY 01/04/2010   Microhematuria 01/03/2009   ANXIETY 12/24/2007   Depression 12/24/2007   HLD (hyperlipidemia) 05/31/2007   MIGRAINE HEADACHE 05/31/2007   Essential hypertension 05/31/2007    PCP: Cathlean Cower MD  REFERRING PROVIDER: Duffy Rhody MD  REFERRING DIAG: M54.16 lumbar back pain with radiculopathy affecting right lower extremity  Rationale for Evaluation and Treatment Rehabilitation  THERAPY DIAG: lumbar radiculopathy; weakness  ONSET DATE: 03/17/22  SUBJECTIVE:  SUBJECTIVE STATEMENT: Pt reports a little bit of soreness but overall feeling better.  PERTINENT HISTORY:  Left TKR November painful/slow to recover used a cane for 2-3 weeks Has a standing stepper at home Had PT for shoulder with [facility]   PAIN:  Are you having pain? yes NPRS scale: 3/10 Pain location: right low back, buttock, right anterior thigh to knee level Aggravating factors: bending over to feed cats;  sitting;rising sit to stand; difficulty sleeping  Relieving factors: supine lying; steroid shot; Meloxicam; sidelying  PRECAUTIONS: None  WEIGHT BEARING RESTRICTIONS: No  FALLS:  Has patient fallen in last 6 months? 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   L2-L3: Disc bulge with endplate osteophytic ridging eccentric to the right. Mild canal stenosis. Narrowing of the right subarticular recess. Moderate right foraminal stenosis. No left foraminal stenosis.   L3-L4: Disc bulge with endplate osteophytic ridging eccentric to the left. No canal stenosis. Minor right foraminal stenosis. Moderate left foraminal stenosis.   L4-L5: Anterolisthesis with uncovering of disc bulge. Facet arthropathy with ligamentum flavum infolding. Moderate canal stenosis. Partial effacement of subarticular recesses.   L5-S1: Left greater than right facet arthropathy. No canal or foraminal stenosis.   IMPRESSION: Multilevel degenerative changes as detailed above. Right subarticular recess and foraminal narrowing at L2-L3. Subarticular recess narrowing also present at L4-L5.        PATIENT SURVEYS:  Eval:  FOTO 58%   COGNITION:  Overall cognitive status: Within functional limits for tasks assessed     SENSATION: WFL  MUSCLE LENGTH: Hamstrings: Right 60 deg; Left 60 deg Thomas test: Right 5 deg; Left 5 deg  POSTURE: decreased lumbar lordosis  PALPATION: No tender points in lumbar paraspinals and gluteals  LUMBAR ROM:  Prone not  comfortable  AROM eval  Flexion 35  Extension 20  Right lateral flexion 30  Left lateral flexion 30  Right rotation   Left rotation    (Blank rows = not tested)  TRUNK STRENGTH:   Eval:  Decreased activation of transverse abdominus muscles; abdominals 4-/5; decreased activation of lumbar multifidi; trunk extensors 4-/5  LOWER EXTREMITY ROM:    Eval:  good LE ROM including left TKR side with full knee flexion, although weakness left quad making terminal knee extension difficult  LOWER EXTREMITY MMT:    MMT Right eval Left eval  Hip flexion 4 4  Hip extension 4- 4  Hip abduction 4- 4  Hip adduction    Hip internal rotation    Hip external rotation    Knee flexion    Knee extension 4 3+  Ankle dorsiflexion 4 4  Ankle plantarflexion 4 4  Ankle inversion    Ankle eversion     (Blank rows = not tested)  LUMBAR SPECIAL TESTS:  Eval:  Straight leg raise test: Negative, Slump test: Negative, and Single leg stance test: Positive  right side 5 sec with lateral trunk lean; left side 3 sec with excessive knee flexion   FUNCTIONAL TESTS:  Eval:  Timed up and go (  TUG): 16 sec   08/03/2022: 6 minute walk test:  1073 ft without pain  GAIT: Comments: no assistive device, moderate limp with decreased stance time on left; right pelvic drop    TODAY'S TREATMENT:   DATE: 08/08/2022 Nustep level 5 x6 min with PT present to discuss status Seated piriformis stretch 2x20 sec bilat Seated hamstring stretch 2x20 sec bilat Seated core series with yellow plyoball:  hip to hip, hip to alt shoulder.  2x10 each Seated reverse crunches holding yellow plyoball 2x10 4 way pelvic tilt on dynadisc x20 each Seated 3 way green pball rollout 5x10 sec each Seated with 2#:  LAQ and hip ER.  2x10 bilat   DATE: 08/03/2022: Nustep level 5 x6 min with PT present to discuss status Seated piriformis stretch with a towel to help support leg 2x20 sec bilat Seated hamstring stretch 2x20 sec bilat Seated  shoulder ER and horizontal abduction with red tband 2x10 Seated hip abduction with red tband 2x10 Sidelying clamshells with red tband 2x10 bilat Supine posterior pelvic tilt with marching and red tband around knees 2x10 bilat Decompression pose in hooklying x1 min Prone press-up 2x10 6 minute walk test: 1073 ft without any pain reported   DATE: 07/27/2022 Nustep level 5 x6 min with PT present to discuss status Seated piriformis stretch with a towel to help support leg 2x20 sec bilat Seated hamstring stretch 2x20 sec bilat Seated hip adduction ball squeeze and hip abduction with red band 2x10 Seated hamstring curl with red tband 2x10 bilat Decompression pose in hooklying x1 min Hooklying posterior pelvic tilt (PPT) 2x10 Hooklying PPT with marching 2x10 Sidelying clamshells 2x10 bilat Prone press-up 2x10 Seated shoulder horizontal abduction with red tband 2x10     PATIENT EDUCATION:  Education details: plan of care Person educated: Patient Education method: Explanation Education comprehension: verbalized understanding   HOME EXERCISE PROGRAM: Access Code: [email or web address] URL: [email or web address] Date: 07/25/2022 Prepared by: Ruben Im  Exercises - Seated Abdominal Set  - 5 x daily - 7 x weekly - 1 sets - 8 reps - 5 hold - Sit to Stand  - 1 x daily - 7 x weekly - 1 sets - 10 reps - Sidelying Hip Abduction  - 1 x daily - 7 x weekly - 1 sets - 10 reps - Supine Posterior Pelvic Tilt  - 1 x daily - 7 x weekly - 2 sets - 10 reps - Static Prone on Elbows  - 1 x daily - 7 x weekly - 1 sets - 1 reps - 2 min hold - Prone Press Up  - 1 x daily - 7 x weekly - 2 sets - 10 reps - Prone Hip Extension  - 1 x daily - 7 x weekly - 2 sets - 10 reps - Clamshell  - 1 x daily - 7 x weekly - 10 reps - Standing Knee Flexion Stretch on Step  - 1 x daily - 7 x weekly - 2 sets - 5 reps - Forward Step Up  - 1 x daily - 7 x weekly - 1 sets - 10 reps  ASSESSMENT:  CLINICAL  IMPRESSION: Ms Bessler presents to skilled PT with reports of a little soreness this morning, but overall, feeling better.  Pt was able to progress with core stability/strengthening exercises.  Pt reports some muscle fatigue following new exercises today and verbalizes understanding to use a cold pack later in the day, if needed for soreness.  Pt continues to requires skilled PT to progress towards  goal related activities.  OBJECTIVE IMPAIRMENTS: decreased mobility, difficulty walking, decreased ROM, decreased strength, impaired perceived functional ability, and pain.   ACTIVITY LIMITATIONS: carrying, lifting, bending, sitting, standing, sleeping, stairs, and locomotion level  PARTICIPATION LIMITATIONS: cleaning, laundry, community activity, and occupation  PERSONAL FACTORS: Profession, Time since onset of injury/illness/exacerbation, and 1 comorbidity: multi region OA  are also affecting patient's functional outcome.   REHAB POTENTIAL: Good  CLINICAL DECISION MAKING: Stable/uncomplicated  EVALUATION COMPLEXITY: Low   GOALS: Goals reviewed with patient? Yes  SHORT TERM GOALS: Target date: 08/14/2022  The patient will demonstrate knowledge of basic self care strategies and exercises to promote healing   Baseline: Goal status: MET  2.  The patient will report a 30% improvement in pain levels with functional activities   Baseline:  Goal status: MET  3.  The patient will have improved trunk flexor and extensor muscle strength to at least 4/5 needed for lifting medium weight objects such as grocery bags  Baseline:  Goal status: IN PROGRESS  4.  The patient will have improved hip strength to at least 4/5 needed for standing, walking longer distances and descending stairs at home and in the community  Baseline:  Goal status: IN PROGRESS  5.  The patient will have improved gait stamina and speed needed to ambulate 600 feet in 6 minutes  Baseline:  Goal status: MET on  08/03/2022   LONG TERM GOALS: Target date:  09/11/2022  The patient will be independent in a safe self progression of a home exercise program to promote further recovery of function   Baseline:  Goal status: IN PROGRESS  2.  The patient will report a 75% improvement in pain levels with functional activities which are currently difficult including sitting, walking, bending Baseline:  Goal status: IN PROGRESS  3.  The patient will have improved trunk flexor and extensor muscle strength to at least 4+/5 needed for lifting medium weight objects such as laundry and luggage  Baseline:  Goal status: INITIAL  4.  The patient will have improved hip strength to at least 4+/5 needed for standing, walking longer distances and descending stairs at home and in the community  Baseline:  Goal status: INITIAL  5.  The patient will have improved FOTO score to   68%    indicating improved function with less pain  Baseline:  Goal status: INITIAL  6.  The patient will be able to resume walking at [employer] 1 mile Baseline:  Goal status: INITIAL   PLAN: PT FREQUENCY: 2x/week  PT DURATION: 8 weeks  PLANNED INTERVENTIONS: Therapeutic exercises, Therapeutic activity, Neuromuscular re-education, Patient/Family education, Self Care, Joint mobilization, Aquatic Therapy, Dry Needling, Electrical stimulation, Spinal manipulation, Spinal mobilization, Cryotherapy, Moist heat, Taping, Traction, Ultrasound, Ionotophoresis 48m/ml Dexamethasone, Manual therapy, and Re-evaluation.  PLAN FOR NEXT SESSION: review and progress HEP; progress transverse abdominus activation;  glute strengthening;  resisted walking; watch for knee hyperextension; quad motor control   SJuel Burrow PT 08/08/22 8:45 AM    [facility] [address] [place] [address]Phone # [phone] [phone]

## 2022-08-10 ENCOUNTER — Encounter: Payer: Self-pay | Admitting: Rehabilitative and Restorative Service Providers"

## 2022-08-10 ENCOUNTER — Ambulatory Visit: Payer: Medicare Other | Admitting: Rehabilitative and Restorative Service Providers"

## 2022-08-10 DIAGNOSIS — M6281 Muscle weakness (generalized): Secondary | ICD-10-CM | POA: Diagnosis not present

## 2022-08-10 DIAGNOSIS — M5416 Radiculopathy, lumbar region: Secondary | ICD-10-CM | POA: Diagnosis not present

## 2022-08-10 NOTE — Therapy (Signed)
OUTPATIENT PHYSICAL THERAPY TREATMENT NOTE   Patient Name: Barbara Gross MRN: [id number] DOB:Apr 09, 1956, 66 y.o., female Today's Date: 08/10/2022   PT End of Session - 08/10/22 0803     Visit Number 7    Date for PT Re-Evaluation 09/11/22    Authorization Type BCBS Medicare    Progress Note Due on Visit 10    PT Start Time 0800    PT Stop Time 0840    PT Time Calculation (min) 40 min    Activity Tolerance Patient tolerated treatment well    Behavior During Therapy [facility] for tasks assessed/performed             Past Medical History:  Diagnosis Date   ABDOMINAL PAIN, LOWER 05/22/2010   Allergy    pollen    ANXIETY 12/24/2007   on meds   Arthritis    back , shoulder   BACTERIAL VAGINITIS 12/24/2007   BREAST MASS, LEFT 07/15/2008   DEPRESSION 12/24/2007   on meds   DIVERTICULITIS OF COLON 04/21/2010   History of migraine    none in years   [place] 05/31/2007   on meds   HYPERTENSION 05/31/2007   Hypertension    Lesion of bladder    MENOPAUSE, EARLY 01/04/2010   Microscopic hematuria 01/03/2009   PONV (postoperative nausea and vomiting)    Past Surgical History:  Procedure Laterality Date   ABDOMINAL HYSTERECTOMY  1990   COLONOSCOPY  2017   JMP-MAC-good prep-tics-int hems-HPP x 1/TA x 1   CYSTOSCOPY WITH BIOPSY N/A 03/01/2020   Procedure: CYSTOSCOPY WITH BIOPSY, FULGERATION BLADDER;  Surgeon: Robley Fries, MD;  Location: [place];  Service: Urology;  Laterality: N/A;   DILATION AND CURETTAGE OF UTERUS     fibroid   EXPLORATORY LAPAROTOMY     as a teenager- may have taken appendix- pt unsure   OOPHORECTOMY Bilateral 1990   POLYPECTOMY  2017   HPP x 1/ TA x 1   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   TONSILLECTOMY  as child   Patient Active Problem List   Diagnosis Date Noted   Right lumbar radiculopathy 05/25/2022   Vitamin D deficiency 05/08/2022   Preop exam for internal medicine 08/01/2021   Lactose intolerance  07/13/2020   Diarrhea 04/28/2020   Left knee pain 06/16/2019   Hyperglycemia 04/28/2019   Baker's cyst, left 02/27/2019   Arthritis of hand, 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   Encounter for well adult exam with abnormal findings 04/09/2017   Right-sided low back pain without sciatica 02/20/2016   Nonallopathic lesion-rib cage 07/11/2015   Nonallopathic lesion of cervical region 07/11/2015   Nonallopathic lesion of thoracic region 07/11/2015   Biceps tendinitis on left 06/17/2015   Left rotator cuff tear 04/15/2015   Bilateral shoulder pain 04/06/2015   Allergic rhinitis, cause unspecified 02/05/2014   Raynaud phenomenon 01/22/2013   Abnormal TSH 01/22/2013   Muscle strain, lower leg 06/18/2012   Lumbar radicular pain 04/24/2011   Colon polyps 01/12/2011   DIVERTICULITIS OF COLON 04/21/2010   MENOPAUSE, EARLY 01/04/2010   Microhematuria 01/03/2009   ANXIETY 12/24/2007   Depression 12/24/2007   HLD (hyperlipidemia) 05/31/2007   MIGRAINE HEADACHE 05/31/2007   Essential hypertension 05/31/2007    PCP: Cathlean Cower MD  REFERRING PROVIDER: Duffy Rhody MD  REFERRING DIAG: M54.16 lumbar back pain with radiculopathy affecting right lower extremity  Rationale for Evaluation and Treatment Rehabilitation  THERAPY DIAG: lumbar radiculopathy; weakness  ONSET DATE: 03/17/22  SUBJECTIVE:  SUBJECTIVE STATEMENT: Pt reports some soreness after last PT appointment.  Pt reports that she has noticed that her back hurts the worst after she has been sitting in a chair all day.  PERTINENT HISTORY:  Left TKR November painful/slow to recover used a cane for 2-3 weeks Has a standing stepper at home Had PT for shoulder with [facility]   PAIN:  Are you having pain? yes NPRS scale: 3/10 Pain location: right low back,  buttock, right anterior thigh to knee level Aggravating factors: bending over to feed cats; sitting;rising sit to stand; difficulty sleeping  Relieving factors: supine lying; steroid shot; Meloxicam; sidelying  PRECAUTIONS: None  WEIGHT BEARING RESTRICTIONS: No  FALLS:  Has patient fallen in last 6 months? 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   L2-L3: Disc bulge with endplate osteophytic ridging eccentric to the right. Mild canal stenosis. Narrowing of the right subarticular recess. Moderate right foraminal stenosis. No left foraminal stenosis.   L3-L4: Disc bulge with endplate osteophytic ridging eccentric to the left. No canal stenosis. Minor right foraminal stenosis. Moderate left foraminal stenosis.   L4-L5: Anterolisthesis with uncovering of disc bulge. Facet arthropathy with ligamentum flavum infolding. Moderate canal stenosis. Partial effacement of subarticular recesses.   L5-S1: Left greater than right facet arthropathy. No canal or foraminal stenosis.   IMPRESSION: Multilevel degenerative changes as detailed above. Right subarticular recess and foraminal narrowing at L2-L3. Subarticular recess narrowing also present at L4-L5.        PATIENT SURVEYS:  Eval:  FOTO 58%   COGNITION:  Overall cognitive status: Within functional limits for tasks assessed     SENSATION: WFL  MUSCLE LENGTH: Hamstrings: Right 60 deg; Left 60 deg Thomas test: Right 5 deg; Left 5 deg  POSTURE: decreased lumbar  lordosis  PALPATION: No tender points in lumbar paraspinals and gluteals  LUMBAR ROM:  Prone not comfortable  AROM eval  Flexion 35  Extension 20  Right lateral flexion 30  Left lateral flexion 30  Right rotation   Left rotation    (Blank rows = not tested)  TRUNK STRENGTH:   Eval:  Decreased activation of transverse abdominus muscles; abdominals 4-/5; decreased activation of lumbar multifidi; trunk extensors 4-/5  LOWER EXTREMITY ROM:    Eval:  good LE ROM including left TKR side with full knee flexion, although weakness left quad making terminal knee extension difficult  LOWER EXTREMITY MMT:    MMT Right eval Left eval  Hip flexion 4 4  Hip extension 4- 4  Hip abduction 4- 4  Hip adduction    Hip internal rotation    Hip external rotation    Knee flexion    Knee extension 4 3+  Ankle dorsiflexion 4 4  Ankle plantarflexion 4 4  Ankle inversion    Ankle eversion     (Blank rows = not tested)  LUMBAR SPECIAL TESTS:  Eval:  Straight leg raise test: Negative, Slump test: Negative, and Single leg stance test: Positive  right side 5 sec with lateral trunk  lean; left side 3 sec with excessive knee flexion   FUNCTIONAL TESTS:  Eval:  Timed up and go (TUG): 16 sec   08/03/2022: 6 minute walk test:  1073 ft without pain  GAIT: Comments: no assistive device, moderate limp with decreased stance time on left; right pelvic drop    TODAY'S TREATMENT:   DATE: 08/10/2022 Nustep level 5 x6 min with PT present to discuss status Seated piriformis stretch 3x20 sec bilat Seated hamstring stretch 3x20 sec bilat 4 way pelvic tilt on dynadisc x20 each Seated core series with yellow plyoball:  hip to hip, hip to alt shoulder.  2x10 each Seated with 2#:  LAQ and hip ER.  2x10 bilat Seated 3 way green pball rollout 5x10 sec each Standing rows with green tband 2x10   DATE: 08/08/2022 Nustep level 5 x6 min with PT present to discuss status Seated piriformis stretch 2x20 sec  bilat Seated hamstring stretch 2x20 sec bilat Seated core series with yellow plyoball:  hip to hip, hip to alt shoulder.  2x10 each Seated reverse crunches holding yellow plyoball 2x10 4 way pelvic tilt on dynadisc x20 each Seated 3 way green pball rollout 5x10 sec each Seated with 2#:  LAQ and hip ER.  2x10 bilat   DATE: 08/03/2022: Nustep level 5 x6 min with PT present to discuss status Seated piriformis stretch with a towel to help support leg 2x20 sec bilat Seated hamstring stretch 2x20 sec bilat Seated shoulder ER and horizontal abduction with red tband 2x10 Seated hip abduction with red tband 2x10 Sidelying clamshells with red tband 2x10 bilat Supine posterior pelvic tilt with marching and red tband around knees 2x10 bilat Decompression pose in hooklying x1 min Prone press-up 2x10 6 minute walk test: 1073 ft without any pain reported     PATIENT EDUCATION:  Education details: plan of care Person educated: Patient Education method: Explanation Education comprehension: verbalized understanding   HOME EXERCISE PROGRAM: Access Code: [email or web address] URL: [email or web address] Date: 07/25/2022 Prepared by: Ruben Im  Exercises - Seated Abdominal Set  - 5 x daily - 7 x weekly - 1 sets - 8 reps - 5 hold - Sit to Stand  - 1 x daily - 7 x weekly - 1 sets - 10 reps - Sidelying Hip Abduction  - 1 x daily - 7 x weekly - 1 sets - 10 reps - Supine Posterior Pelvic Tilt  - 1 x daily - 7 x weekly - 2 sets - 10 reps - Static Prone on Elbows  - 1 x daily - 7 x weekly - 1 sets - 1 reps - 2 min hold - Prone Press Up  - 1 x daily - 7 x weekly - 2 sets - 10 reps - Prone Hip Extension  - 1 x daily - 7 x weekly - 2 sets - 10 reps - Clamshell  - 1 x daily - 7 x weekly - 10 reps - Standing Knee Flexion Stretch on Step  - 1 x daily - 7 x weekly - 2 sets - 5 reps - Forward Step Up  - 1 x daily - 7 x weekly - 1 sets - 10 reps  ASSESSMENT:  CLINICAL IMPRESSION: Ms Osmun  presents to skilled PT with reports some soreness following last session, but no increased pain.  Pt educated that she should avoid sitting for prolonged periods without standing up and moving around secondary to increased pain.  Pt may benefit from addition of extension exercises next session.  Pt continues  to require skilled PT to require skilled PT to progress towards goal related activities.  OBJECTIVE IMPAIRMENTS: decreased mobility, difficulty walking, decreased ROM, decreased strength, impaired perceived functional ability, and pain.   ACTIVITY LIMITATIONS: carrying, lifting, bending, sitting, standing, sleeping, stairs, and locomotion level  PARTICIPATION LIMITATIONS: cleaning, laundry, community activity, and occupation  PERSONAL FACTORS: Profession, Time since onset of injury/illness/exacerbation, and 1 comorbidity: multi region OA  are also affecting patient's functional outcome.   REHAB POTENTIAL: Good  CLINICAL DECISION MAKING: Stable/uncomplicated  EVALUATION COMPLEXITY: Low   GOALS: Goals reviewed with patient? Yes  SHORT TERM GOALS: Target date: 08/14/2022  The patient will demonstrate knowledge of basic self care strategies and exercises to promote healing   Baseline: Goal status: MET  2.  The patient will report a 30% improvement in pain levels with functional activities   Baseline:  Goal status: MET  3.  The patient will have improved trunk flexor and extensor muscle strength to at least 4/5 needed for lifting medium weight objects such as grocery bags  Baseline:  Goal status: IN PROGRESS  4.  The patient will have improved hip strength to at least 4/5 needed for standing, walking longer distances and descending stairs at home and in the community  Baseline:  Goal status: IN PROGRESS  5.  The patient will have improved gait stamina and speed needed to ambulate 600 feet in 6 minutes  Baseline:  Goal status: MET on 08/03/2022   LONG TERM GOALS: Target date:   09/11/2022  The patient will be independent in a safe self progression of a home exercise program to promote further recovery of function   Baseline:  Goal status: IN PROGRESS  2.  The patient will report a 75% improvement in pain levels with functional activities which are currently difficult including sitting, walking, bending Baseline:  Goal status: IN PROGRESS  3.  The patient will have improved trunk flexor and extensor muscle strength to at least 4+/5 needed for lifting medium weight objects such as laundry and luggage  Baseline:  Goal status: INITIAL  4.  The patient will have improved hip strength to at least 4+/5 needed for standing, walking longer distances and descending stairs at home and in the community  Baseline:  Goal status: INITIAL  5.  The patient will have improved FOTO score to   68%    indicating improved function with less pain  Baseline:  Goal status: INITIAL  6.  The patient will be able to resume walking at [employer] 1 mile Baseline:  Goal status: INITIAL   PLAN: PT FREQUENCY: 2x/week  PT DURATION: 8 weeks  PLANNED INTERVENTIONS: Therapeutic exercises, Therapeutic activity, Neuromuscular re-education, Patient/Family education, Self Care, Joint mobilization, Aquatic Therapy, Dry Needling, Electrical stimulation, Spinal manipulation, Spinal mobilization, Cryotherapy, Moist heat, Taping, Traction, Ultrasound, Ionotophoresis 35m/ml Dexamethasone, Manual therapy, and Re-evaluation.  PLAN FOR NEXT SESSION: review and progress HEP; consider adding extension;  glute strengthening;  resisted walking; watch for knee hyperextension; quad motor control   SJuel Burrow PT 08/10/22 8:44 AM    [id number] [place] [id number] # [phone] [phone]

## 2022-08-13 ENCOUNTER — Ambulatory Visit: Payer: Medicare Other | Admitting: Rehabilitative and Restorative Service Providers"

## 2022-08-13 ENCOUNTER — Encounter: Payer: Self-pay | Admitting: Rehabilitative and Restorative Service Providers"

## 2022-08-13 DIAGNOSIS — M6281 Muscle weakness (generalized): Secondary | ICD-10-CM | POA: Diagnosis not present

## 2022-08-13 DIAGNOSIS — M5416 Radiculopathy, lumbar region: Secondary | ICD-10-CM | POA: Diagnosis not present

## 2022-08-13 NOTE — Therapy (Signed)
OUTPATIENT PHYSICAL THERAPY TREATMENT NOTE   Patient Name: Barbara Gross MRN: [id number] DOB:05-Nov-1955, 66 y.o., female Today's Date: 08/13/2022   PT End of Session - 08/13/22 0932     Visit Number 8    Date for PT Re-Evaluation 09/11/22    Authorization Type BCBS Medicare    Progress Note Due on Visit 10    PT Start Time 0930    PT Stop Time 1010    PT Time Calculation (min) 40 min    Activity Tolerance Patient tolerated treatment well    Behavior During Therapy [facility] for tasks assessed/performed             Past Medical History:  Diagnosis Date   ABDOMINAL PAIN, LOWER 05/22/2010   Allergy    pollen    ANXIETY 12/24/2007   on meds   Arthritis    back , shoulder   BACTERIAL VAGINITIS 12/24/2007   BREAST MASS, LEFT 07/15/2008   DEPRESSION 12/24/2007   on meds   DIVERTICULITIS OF COLON 04/21/2010   History of migraine    none in years   [place] 05/31/2007   on meds   HYPERTENSION 05/31/2007   Hypertension    Lesion of bladder    MENOPAUSE, EARLY 01/04/2010   Microscopic hematuria 01/03/2009   PONV (postoperative nausea and vomiting)    Past Surgical History:  Procedure Laterality Date   ABDOMINAL HYSTERECTOMY  1990   COLONOSCOPY  2017   JMP-MAC-good prep-tics-int hems-HPP x 1/TA x 1   CYSTOSCOPY WITH BIOPSY N/A 03/01/2020   Procedure: CYSTOSCOPY WITH BIOPSY, FULGERATION BLADDER;  Surgeon: Robley Fries, MD;  Location: [facility];  Service: Urology;  Laterality: N/A;   DILATION AND CURETTAGE OF UTERUS     fibroid   EXPLORATORY LAPAROTOMY     as a teenager- may have taken appendix- pt unsure   OOPHORECTOMY Bilateral 1990   POLYPECTOMY  2017   HPP x 1/ TA x 1   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   TONSILLECTOMY  as child   Patient Active Problem List   Diagnosis Date Noted   Right lumbar radiculopathy 05/25/2022   Vitamin D deficiency 05/08/2022   Preop exam for internal medicine 08/01/2021   Lactose intolerance  07/13/2020   Diarrhea 04/28/2020   Left knee pain 06/16/2019   Hyperglycemia 04/28/2019   Baker's cyst, left 02/27/2019   Arthritis of hand, 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   Encounter for well adult exam with abnormal findings 04/09/2017   Right-sided low back pain without sciatica 02/20/2016   Nonallopathic lesion-rib cage 07/11/2015   Nonallopathic lesion of cervical region 07/11/2015   Nonallopathic lesion of thoracic region 07/11/2015   Biceps tendinitis on left 06/17/2015   Left rotator cuff tear 04/15/2015   Bilateral shoulder pain 04/06/2015   Allergic rhinitis, cause unspecified 02/05/2014   Raynaud phenomenon 01/22/2013   Abnormal TSH 01/22/2013   Muscle strain, lower leg 06/18/2012   Lumbar radicular pain 04/24/2011   Colon polyps 01/12/2011   DIVERTICULITIS OF COLON 04/21/2010   MENOPAUSE, EARLY 01/04/2010   Microhematuria 01/03/2009   ANXIETY 12/24/2007   Depression 12/24/2007   HLD (hyperlipidemia) 05/31/2007   MIGRAINE HEADACHE 05/31/2007   Essential hypertension 05/31/2007    PCP: Cathlean Cower MD  REFERRING PROVIDER: Duffy Rhody MD  REFERRING DIAG: M54.16 lumbar back pain with radiculopathy affecting right lower extremity  Rationale for Evaluation and Treatment Rehabilitation  THERAPY DIAG: lumbar radiculopathy; weakness  ONSET DATE: 03/17/22  SUBJECTIVE:  SUBJECTIVE STATEMENT: Pt reports that she has noticed that the more that she moves around, the better she feels.    PERTINENT HISTORY:  Left TKR November painful/slow to recover used a cane for 2-3 weeks Has a standing stepper at home Had PT for shoulder with [facility]   PAIN:  Are you having pain? yes NPRS scale: 3/10 Pain location: right low back, buttock, right anterior thigh to knee level Aggravating factors: bending  over to feed cats; sitting;rising sit to stand; difficulty sleeping  Relieving factors: supine lying; steroid shot; Meloxicam; sidelying  PRECAUTIONS: None  WEIGHT BEARING RESTRICTIONS: No  FALLS:  Has patient fallen in last 6 months? 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   L2-L3: Disc bulge with endplate osteophytic ridging eccentric to the right. Mild canal stenosis. Narrowing of the right subarticular recess. Moderate right foraminal stenosis. No left foraminal stenosis.   L3-L4: Disc bulge with endplate osteophytic ridging eccentric to the left. No canal stenosis. Minor right foraminal stenosis. Moderate left foraminal stenosis.   L4-L5: Anterolisthesis with uncovering of disc bulge. Facet arthropathy with ligamentum flavum infolding. Moderate canal stenosis. Partial effacement of subarticular recesses.   L5-S1: Left greater than right facet arthropathy. No canal or foraminal stenosis.   IMPRESSION: Multilevel degenerative changes as detailed above. Right subarticular recess and foraminal narrowing at L2-L3. Subarticular recess narrowing also present at L4-L5.        PATIENT SURVEYS:  Eval:  FOTO 58%   COGNITION:  Overall cognitive status: Within functional limits for tasks assessed     SENSATION: WFL  MUSCLE LENGTH: Hamstrings: Right 60 deg; Left 60 deg Thomas test: Right 5 deg; Left 5 deg  POSTURE: decreased lumbar lordosis  PALPATION: No tender points in lumbar paraspinals and gluteals  LUMBAR ROM:   Prone not comfortable  AROM eval  Flexion 35  Extension 20  Right lateral flexion 30  Left lateral flexion 30  Right rotation   Left rotation    (Blank rows = not tested)  TRUNK STRENGTH:   Eval:  Decreased activation of transverse abdominus muscles; abdominals 4-/5; decreased activation of lumbar multifidi; trunk extensors 4-/5  LOWER EXTREMITY ROM:    Eval:  good LE ROM including left TKR side with full knee flexion, although weakness left quad making terminal knee extension difficult  LOWER EXTREMITY MMT:    MMT Right eval Left eval  Hip flexion 4 4  Hip extension 4- 4  Hip abduction 4- 4  Hip adduction    Hip internal rotation    Hip external rotation    Knee flexion    Knee extension 4 3+  Ankle dorsiflexion 4 4  Ankle plantarflexion 4 4  Ankle inversion    Ankle eversion     (Blank rows = not tested)  LUMBAR SPECIAL TESTS:  Eval:  Straight leg raise test: Negative, Slump test: Negative, and Single leg stance test: Positive  right side 5 sec with lateral trunk lean; left side 3 sec with excessive knee flexion   FUNCTIONAL  TESTS:  Eval:  Timed up and go (TUG): 16 sec   08/03/2022: 6 minute walk test:  1073 ft without pain  GAIT: Comments: no assistive device, moderate limp with decreased stance time on left; right pelvic drop    TODAY'S TREATMENT:   DATE: 08/13/2022 Nustep level 5 x6 min with PT present to discuss status Sciatic nerve tensioner x10 bilat 4 way pelvic tilt on dynadisc x20 each Supine hip abduction clamshells with yellow loop 2x10 Supine hamstring stretch with strap 2x20 sec bilat Prone press-up 2x10 Prone hip extension 2x10 bilat Seated modified deadlift holding 5# dumbbell 2x10 Sit to/from stand holding 5# dumb-bell:  x10 with chest press, x10 with overhead press Seated with 5# on thigh with "step" over small cone 2x10 bilat Standing at step hip flexion stretch with fwd reach x10 bilat   DATE: 08/10/2022 Nustep level 5 x6 min  with PT present to discuss status Seated piriformis stretch 3x20 sec bilat Seated hamstring stretch 3x20 sec bilat 4 way pelvic tilt on dynadisc x20 each Seated core series with yellow plyoball:  hip to hip, hip to alt shoulder.  2x10 each Seated with 2#:  LAQ and hip ER.  2x10 bilat Seated 3 way green pball rollout 5x10 sec each Standing rows with green tband 2x10   DATE: 08/08/2022 Nustep level 5 x6 min with PT present to discuss status Seated piriformis stretch 2x20 sec bilat Seated hamstring stretch 2x20 sec bilat Seated core series with yellow plyoball:  hip to hip, hip to alt shoulder.  2x10 each Seated reverse crunches holding yellow plyoball 2x10 4 way pelvic tilt on dynadisc x20 each Seated 3 way green pball rollout 5x10 sec each Seated with 2#:  LAQ and hip ER.  2x10 bilat    PATIENT EDUCATION:  Education details: plan of care Person educated: Patient Education method: Explanation Education comprehension: verbalized understanding   HOME EXERCISE PROGRAM: Access Code: [email or web address] URL: [email or web address] Date: 08/13/2022 Prepared by: Shelby Dubin Abass Misener  Exercises - Seated Abdominal Set  - 5 x daily - 7 x weekly - 1 sets - 8 reps - 5 hold - Sit to Stand  - 1 x daily - 7 x weekly - 1 sets - 10 reps - Seated Sciatic Tensioner  - 1 x daily - 7 x weekly - 1 sets - 10 reps - Sidelying Hip Abduction  - 1 x daily - 7 x weekly - 1 sets - 10 reps - Supine Posterior Pelvic Tilt  - 1 x daily - 7 x weekly - 2 sets - 10 reps - Static Prone on Elbows  - 1 x daily - 7 x weekly - 1 sets - 1 reps - 2 min hold - Prone Press Up  - 1 x daily - 7 x weekly - 2 sets - 10 reps - Prone Hip Extension  - 1 x daily - 7 x weekly - 2 sets - 10 reps - Clamshell  - 1 x daily - 7 x weekly - 10 reps - Standing Knee Flexion Stretch on Step  - 1 x daily - 7 x weekly - 2 sets - 5 reps - Forward Step Up  - 1 x daily - 7 x weekly - 1 sets - 10 reps  ASSESSMENT:  CLINICAL IMPRESSION: Ms  Dematteo presents to skilled PT with reports of feeling at least 50% better since starting skilled PT and getting the injection previously.  Pt with positive slump test and given sciatic nerve tensioner stretch for HEP.  Pt  reports overall compliance with HEP. Pt requires minimal cuing during there ex for technique, especially with newer exercises. Pt is progressing towards goal related activities and continues to require skilled PT to further progress towards goal related activities.  OBJECTIVE IMPAIRMENTS: decreased mobility, difficulty walking, decreased ROM, decreased strength, impaired perceived functional ability, and pain.   ACTIVITY LIMITATIONS: carrying, lifting, bending, sitting, standing, sleeping, stairs, and locomotion level  PARTICIPATION LIMITATIONS: cleaning, laundry, community activity, and occupation  PERSONAL FACTORS: Profession, Time since onset of injury/illness/exacerbation, and 1 comorbidity: multi region OA  are also affecting patient's functional outcome.   REHAB POTENTIAL: Good  CLINICAL DECISION MAKING: Stable/uncomplicated  EVALUATION COMPLEXITY: Low   GOALS: Goals reviewed with patient? Yes  SHORT TERM GOALS: Target date: 08/14/2022  The patient will demonstrate knowledge of basic self care strategies and exercises to promote healing   Baseline: Goal status: MET  2.  The patient will report a 30% improvement in pain levels with functional activities   Baseline:  Goal status: MET  3.  The patient will have improved trunk flexor and extensor muscle strength to at least 4/5 needed for lifting medium weight objects such as grocery bags  Baseline:  Goal status: IN PROGRESS  4.  The patient will have improved hip strength to at least 4/5 needed for standing, walking longer distances and descending stairs at home and in the community  Baseline:  Goal status: IN PROGRESS  5.  The patient will have improved gait stamina and speed needed to ambulate 600 feet in  6 minutes  Baseline:  Goal status: MET on 08/03/2022   LONG TERM GOALS: Target date:  09/11/2022  The patient will be independent in a safe self progression of a home exercise program to promote further recovery of function   Baseline:  Goal status: IN PROGRESS  2.  The patient will report a 75% improvement in pain levels with functional activities which are currently difficult including sitting, walking, bending Baseline: On 08/13/22 reports a 50% improvement Goal status: IN PROGRESS  3.  The patient will have improved trunk flexor and extensor muscle strength to at least 4+/5 needed for lifting medium weight objects such as laundry and luggage  Baseline:  Goal status: INITIAL  4.  The patient will have improved hip strength to at least 4+/5 needed for standing, walking longer distances and descending stairs at home and in the community  Baseline:  Goal status: INITIAL  5.  The patient will have improved FOTO score to   68%    indicating improved function with less pain  Baseline:  Goal status: INITIAL  6.  The patient will be able to resume walking at [employer] 1 mile Baseline:  Goal status: INITIAL   PLAN: PT FREQUENCY: 2x/week  PT DURATION: 8 weeks  PLANNED INTERVENTIONS: Therapeutic exercises, Therapeutic activity, Neuromuscular re-education, Patient/Family education, Self Care, Joint mobilization, Aquatic Therapy, Dry Needling, Electrical stimulation, Spinal manipulation, Spinal mobilization, Cryotherapy, Moist heat, Taping, Traction, Ultrasound, Ionotophoresis 56m/ml Dexamethasone, Manual therapy, and Re-evaluation.  PLAN FOR NEXT SESSION: Reassess MMT, review and progress HEP; consider adding extension;  glute strengthening;  resisted walking; watch for knee hyperextension; quad motor control   SJuel Burrow PT 08/13/22 10:13 AM    [place] [place]entonGMission  [id number] # [phone]  [phone]

## 2022-08-15 ENCOUNTER — Encounter: Payer: Medicare Other | Admitting: Physical Therapy

## 2022-08-20 DIAGNOSIS — Z6830 Body mass index (BMI) 30.0-30.9, adult: Secondary | ICD-10-CM | POA: Diagnosis not present

## 2022-08-20 DIAGNOSIS — M4316 Spondylolisthesis, lumbar region: Secondary | ICD-10-CM | POA: Diagnosis not present

## 2022-08-22 ENCOUNTER — Ambulatory Visit: Payer: Medicare Other | Admitting: Physical Therapy

## 2022-08-22 ENCOUNTER — Encounter: Payer: Medicare Other | Admitting: Physical Therapy

## 2022-08-22 DIAGNOSIS — M6281 Muscle weakness (generalized): Secondary | ICD-10-CM | POA: Diagnosis not present

## 2022-08-22 DIAGNOSIS — M5416 Radiculopathy, lumbar region: Secondary | ICD-10-CM | POA: Diagnosis not present

## 2022-08-22 NOTE — Therapy (Addendum)
OUTPATIENT PHYSICAL THERAPY TREATMENT NOTE/DISCHARGE SUMMARY   Patient Name: Barbara Gross MRN: [id number] DOB:06-28-1956, 66 y.o., female Today's Date: 08/22/2022   PT End of Session - 08/22/22 0804     Visit Number 9    Date for PT Re-Evaluation 09/11/22    Authorization Type BCBS Medicare    PT Start Time 0801    PT Stop Time 0843    PT Time Calculation (min) 42 min    Activity Tolerance Patient tolerated treatment well             Past Medical History:  Diagnosis Date   ABDOMINAL PAIN, LOWER 05/22/2010   Allergy    pollen    ANXIETY 12/24/2007   on meds   Arthritis    back , shoulder   BACTERIAL VAGINITIS 12/24/2007   BREAST MASS, LEFT 07/15/2008   DEPRESSION 12/24/2007   on meds   DIVERTICULITIS OF COLON 04/21/2010   History of migraine    none in years   [place] 05/31/2007   on meds   HYPERTENSION 05/31/2007   Hypertension    Lesion of bladder    MENOPAUSE, EARLY 01/04/2010   Microscopic hematuria 01/03/2009   PONV (postoperative nausea and vomiting)    Past Surgical History:  Procedure Laterality Date   ABDOMINAL HYSTERECTOMY  1990   COLONOSCOPY  2017   JMP-MAC-good prep-tics-int hems-HPP x 1/TA x 1   CYSTOSCOPY WITH BIOPSY N/A 03/01/2020   Procedure: CYSTOSCOPY WITH BIOPSY, FULGERATION BLADDER;  Surgeon: Robley Fries, MD;  Location: [place];  Service: Urology;  Laterality: N/A;   DILATION AND CURETTAGE OF UTERUS     fibroid   EXPLORATORY LAPAROTOMY     as a teenager- may have taken appendix- pt unsure   OOPHORECTOMY Bilateral 1990   POLYPECTOMY  2017   HPP x 1/ TA x 1   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   TONSILLECTOMY  as child   Patient Active Problem List   Diagnosis Date Noted   Right lumbar radiculopathy 05/25/2022   Vitamin D deficiency 05/08/2022   Preop exam for internal medicine 08/01/2021   Lactose intolerance 07/13/2020   Diarrhea 04/28/2020   Left knee pain 06/16/2019   Hyperglycemia  04/28/2019   Baker's cyst, left 02/27/2019   Arthritis of hand, 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   Encounter for well adult exam with abnormal findings 04/09/2017   Right-sided low back pain without sciatica 02/20/2016   Nonallopathic lesion-rib cage 07/11/2015   Nonallopathic lesion of cervical region 07/11/2015   Nonallopathic lesion of thoracic region 07/11/2015   Biceps tendinitis on left 06/17/2015   Left rotator cuff tear 04/15/2015   Bilateral shoulder pain 04/06/2015   Allergic rhinitis, cause unspecified 02/05/2014   Raynaud phenomenon 01/22/2013   Abnormal TSH 01/22/2013   Muscle strain, lower leg 06/18/2012   Lumbar radicular pain 04/24/2011   Colon polyps 01/12/2011   DIVERTICULITIS OF COLON 04/21/2010   MENOPAUSE, EARLY 01/04/2010   Microhematuria 01/03/2009   ANXIETY 12/24/2007   Depression 12/24/2007   HLD (hyperlipidemia) 05/31/2007   MIGRAINE HEADACHE 05/31/2007   Essential hypertension 05/31/2007    PCP: Cathlean Cower MD  REFERRING PROVIDER: Duffy Rhody MD  REFERRING DIAG: M54.16 lumbar back pain with radiculopathy affecting right lower extremity  Rationale for Evaluation and Treatment Rehabilitation  THERAPY DIAG: lumbar radiculopathy; weakness  ONSET DATE: 03/17/22  SUBJECTIVE:  SUBJECTIVE STATEMENT: I think the injection is wearing off.  The doctor mentioned surgery but I don't want that.  No leg pain but stays around right hip and back.    PERTINENT HISTORY:  Left TKR November painful/slow to recover used a cane for 2-3 weeks Has a standing stepper at home Had PT for shoulder with [facility]   PAIN:  Are you having pain? yes NPRS scale: 3/10 Pain location: right low back, buttock, right anterior thigh to knee level Aggravating factors: bending over to feed cats;  sitting;rising sit to stand; difficulty sleeping  Relieving factors: supine lying; steroid shot; Meloxicam; sidelying  PRECAUTIONS: None  WEIGHT BEARING RESTRICTIONS: No  FALLS:  Has patient fallen in last 6 months? 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   L2-L3: Disc bulge with endplate osteophytic ridging eccentric to the right. Mild canal stenosis. Narrowing of the right subarticular recess. Moderate right foraminal stenosis. No left foraminal stenosis.   L3-L4: Disc bulge with endplate osteophytic ridging eccentric to the left. No canal stenosis. Minor right foraminal stenosis. Moderate left foraminal stenosis.   L4-L5: Anterolisthesis with uncovering of disc bulge. Facet arthropathy with ligamentum flavum infolding. Moderate canal stenosis. Partial effacement of subarticular recesses.   L5-S1: Left greater than right facet arthropathy. No canal or foraminal stenosis.   IMPRESSION: Multilevel degenerative changes as detailed above. Right subarticular recess and foraminal narrowing at L2-L3. Subarticular recess narrowing also present at L4-L5.        PATIENT SURVEYS:  Eval:  FOTO 58%   COGNITION:  Overall cognitive status: Within functional limits for tasks assessed     SENSATION: WFL  MUSCLE LENGTH: Hamstrings: Right 60 deg; Left 60 deg Thomas test: Right 5 deg; Left 5 deg  POSTURE: decreased lumbar lordosis  PALPATION: No tender points in lumbar paraspinals and gluteals  LUMBAR ROM:  Prone not  comfortable  AROM eval  Flexion 35  Extension 20  Right lateral flexion 30  Left lateral flexion 30  Right rotation   Left rotation    (Blank rows = not tested)  TRUNK STRENGTH:   Eval:  Decreased activation of transverse abdominus muscles; abdominals 4-/5; decreased activation of lumbar multifidi; trunk extensors 4-/5  LOWER EXTREMITY ROM:    Eval:  good LE ROM including left TKR side with full knee flexion, although weakness left quad making terminal knee extension difficult  LOWER EXTREMITY MMT:    MMT Right eval Left eval 11/22  Hip flexion 4 4   Hip extension 4- 4   Hip abduction 4- 4   Hip adduction     Hip internal rotation     Hip external rotation     Knee flexion     Knee extension 4 3+ Right 4+/left 4-  Ankle dorsiflexion 4 4   Ankle plantarflexion 4 4   Ankle inversion     Ankle eversion      (Blank rows = not tested)  LUMBAR SPECIAL TESTS:  Eval:  Straight leg raise test: Negative, Slump test: Negative,  and Single leg stance test: Positive  right side 5 sec with lateral trunk lean; left side 3 sec with excessive knee flexion   FUNCTIONAL TESTS:  Eval:  Timed up and go (TUG): 16 sec   08/03/2022: 6 minute walk test:  1073 ft without pain  GAIT: Comments: no assistive device, moderate limp with decreased stance time on left; right pelvic drop    TODAY'S TREATMENT:  DATE: 08/22/2022 Nustep level 2 x10 min (old model) with PT present to discuss status Discussed DN patient will consider in future Addaday soft tissue to right lumbar and gluteals Sciatic nerve tensioner x10 bilat 3 way blue ball rolls seated 10x  Standing modified deadlift holding 2 5# dumbbells 2x8 (weights to front of knees and 2nd set weights down side of thighs) (some bil hip discomfort) Sit to/from stand holding 5# dumb-bell:   x10 with overhead press Standing at step hip flexion stretch with fwd reach x10 bilat   DATE: 08/13/2022 Nustep level 5 x6 min with PT present to  discuss status Sciatic nerve tensioner x10 bilat 4 way pelvic tilt on dynadisc x20 each Supine hip abduction clamshells with yellow loop 2x10 Supine hamstring stretch with strap 2x20 sec bilat Prone press-up 2x10 Prone hip extension 2x10 bilat Seated modified deadlift holding 5# dumbbell 2x10 Sit to/from stand holding 5# dumb-bell:  x10 with chest press, x10 with overhead press Seated with 5# on thigh with "step" over small cone 2x10 bilat Standing at step hip flexion stretch with fwd reach x10 bilat   DATE: 08/10/2022 Nustep level 5 x6 min with PT present to discuss status Seated piriformis stretch 3x20 sec bilat Seated hamstring stretch 3x20 sec bilat 4 way pelvic tilt on dynadisc x20 each Seated core series with yellow plyoball:  hip to hip, hip to alt shoulder.  2x10 each Seated with 2#:  LAQ and hip ER.  2x10 bilat Seated 3 way green pball rollout 5x10 sec each Standing rows with green tband 2x10   PATIENT EDUCATION:  Education details: plan of care Person educated: Patient Education method: Explanation Education comprehension: verbalized understanding   HOME EXERCISE PROGRAM: Access Code: [email or web address] URL: [email or web address] Date: 08/13/2022 Prepared by: Shelby Dubin Menke  Exercises - Seated Abdominal Set  - 5 x daily - 7 x weekly - 1 sets - 8 reps - 5 hold - Sit to Stand  - 1 x daily - 7 x weekly - 1 sets - 10 reps - Seated Sciatic Tensioner  - 1 x daily - 7 x weekly - 1 sets - 10 reps - Sidelying Hip Abduction  - 1 x daily - 7 x weekly - 1 sets - 10 reps - Supine Posterior Pelvic Tilt  - 1 x daily - 7 x weekly - 2 sets - 10 reps - Static Prone on Elbows  - 1 x daily - 7 x weekly - 1 sets - 1 reps - 2 min hold - Prone Press Up  - 1 x daily - 7 x weekly - 2 sets - 10 reps - Prone Hip Extension  - 1 x daily - 7 x weekly - 2 sets - 10 reps - Clamshell  - 1 x daily - 7 x weekly - 10 reps - Standing Knee Flexion Stretch on Step  - 1 x daily - 7 x weekly - 2  sets - 5 reps - Forward Step Up  - 1 x daily - 7 x weekly - 1 sets - 10 reps  ASSESSMENT:  CLINICAL IMPRESSION: Patient  reports returning LBP and posterior hip pain on the right although no radiating LE pain.  Good response to instrument assisted manual therapy and discussed DN as an option to help with muscle tightness and pain modulation.  Improving quad strength bilaterally and reports less knee hyperextension overall.  Some discomfort in bilateral hips with modified dead lifts in standing.  Verbal cues for head up to ensure good hip hinge technique.       OBJECTIVE IMPAIRMENTS: decreased mobility, difficulty walking, decreased ROM, decreased strength, impaired perceived functional ability, and pain.   ACTIVITY LIMITATIONS: carrying, lifting, bending, sitting, standing, sleeping, stairs, and locomotion level  PARTICIPATION LIMITATIONS: cleaning, laundry, community activity, and occupation  PERSONAL FACTORS: Profession, Time since onset of injury/illness/exacerbation, and 1 comorbidity: multi region OA  are also affecting patient's functional outcome.   REHAB POTENTIAL: Good  CLINICAL DECISION MAKING: Stable/uncomplicated  EVALUATION COMPLEXITY: Low   GOALS: Goals reviewed with patient? Yes  SHORT TERM GOALS: Target date: 08/14/2022  The patient will demonstrate knowledge of basic self care strategies and exercises to promote healing   Baseline: Goal status: MET  2.  The patient will report a 30% improvement in pain levels with functional activities   Baseline:  Goal status: MET  3.  The patient will have improved trunk flexor and extensor muscle strength to at least 4/5 needed for lifting medium weight objects such as grocery bags  Baseline:  Goal status: IN PROGRESS  4.  The patient will have improved hip strength to at least 4/5 needed for standing, walking longer distances and descending stairs at home and in the community  Baseline:  Goal status: IN PROGRESS  5.   The patient will have improved gait stamina and speed needed to ambulate 600 feet in 6 minutes  Baseline:  Goal status: MET on 08/03/2022   LONG TERM GOALS: Target date:  09/11/2022  The patient will be independent in a safe self progression of a home exercise program to promote further recovery of function   Baseline:  Goal status: IN PROGRESS  2.  The patient will report a 75% improvement in pain levels with functional activities which are currently difficult including sitting, walking, bending Baseline: On 08/13/22 reports a 50% improvement Goal status: IN PROGRESS  3.  The patient will have improved trunk flexor and extensor muscle strength to at least 4+/5 needed for lifting medium weight objects such as laundry and luggage  Baseline:  Goal status: INITIAL  4.  The patient will have improved hip strength to at least 4+/5 needed for standing, walking longer distances and descending stairs at home and in the community  Baseline:  Goal status: INITIAL  5.  The patient will have improved FOTO score to   68%    indicating improved function with less pain  Baseline:  Goal status: INITIAL  6.  The patient will be able to resume walking at [employer] 1 mile Baseline:  Goal status: INITIAL   PLAN: PT FREQUENCY: 2x/week  PT DURATION: 8 weeks  PLANNED INTERVENTIONS: Therapeutic exercises, Therapeutic activity, Neuromuscular re-education, Patient/Family education, Self Care, Joint mobilization, Aquatic Therapy, Dry Needling, Electrical stimulation, Spinal manipulation, Spinal mobilization, Cryotherapy, Moist heat, Taping, Traction, Ultrasound, Ionotophoresis 34m/ml Dexamethasone, Manual therapy, and Re-evaluation.  PLAN FOR NEXT SESSION:  10th visit prog note next visit:  FOTO, lumbar ROM, MMT, TUG, review and progress HEP; pt will consider DN to right lumbar and gluteals;    lumbo/pelvic/hip strengthening;  watch for knee hyperextension/quad motor control  Ruben Im,  PT 08/22/22 8:57 AM Phone: [phone] Fax: [phone]   [facility] [address], [address] [place], [place] [address] Phone # [phone] Fax [phone]   PHYSICAL THERAPY DISCHARGE SUMMARY  Visits from Start of Care: 9  Current functional level related to goals / functional outcomes: The patient cancelled her last scheduled appts.  Will discharge from PT at this time.   Remaining deficits: As above   Education / Equipment: HEP   Patient agrees to discharge. Patient goals were partially met. Patient is being discharged due to not returning since the last visit.  Ruben Im, PT 10/10/22 7:51 AM Phone: [phone] Fax: [phone]

## 2022-08-27 ENCOUNTER — Encounter: Payer: Self-pay | Admitting: Internal Medicine

## 2022-08-29 ENCOUNTER — Encounter: Payer: Medicare Other | Admitting: Physical Therapy

## 2022-08-30 ENCOUNTER — Encounter: Payer: Self-pay | Admitting: Internal Medicine

## 2022-08-30 ENCOUNTER — Ambulatory Visit (AMBULATORY_SURGERY_CENTER): Payer: Medicare Other | Admitting: Internal Medicine

## 2022-08-30 VITALS — BP 166/100 | HR 72 | Temp 98.6°F | Resp 15 | Ht 62.0 in | Wt 167.0 lb

## 2022-08-30 DIAGNOSIS — D129 Benign neoplasm of anus and anal canal: Secondary | ICD-10-CM | POA: Diagnosis not present

## 2022-08-30 DIAGNOSIS — Z86010 Personal history of colonic polyps: Secondary | ICD-10-CM

## 2022-08-30 DIAGNOSIS — D125 Benign neoplasm of sigmoid colon: Secondary | ICD-10-CM

## 2022-08-30 DIAGNOSIS — D120 Benign neoplasm of cecum: Secondary | ICD-10-CM

## 2022-08-30 DIAGNOSIS — D128 Benign neoplasm of rectum: Secondary | ICD-10-CM | POA: Diagnosis not present

## 2022-08-30 DIAGNOSIS — Z09 Encounter for follow-up examination after completed treatment for conditions other than malignant neoplasm: Secondary | ICD-10-CM

## 2022-08-30 DIAGNOSIS — K635 Polyp of colon: Secondary | ICD-10-CM | POA: Diagnosis not present

## 2022-08-30 DIAGNOSIS — D124 Benign neoplasm of descending colon: Secondary | ICD-10-CM | POA: Diagnosis not present

## 2022-08-30 MED ORDER — SODIUM CHLORIDE 0.9 % IV SOLN: 500.0000 mL | INTRAVENOUS | Status: DC

## 2022-08-30 NOTE — Progress Notes (Signed)
Report to PACU, RN, vss, BBS= Clear.  

## 2022-08-30 NOTE — Progress Notes (Signed)
GASTROENTEROLOGY PROCEDURE H&P NOTE   Primary Care Physician: Biagio Borg, MD    Reason for Procedure:  History of adenomatous and sessile serrated colon polyps  Plan:    Colonoscopy  Patient is appropriate for endoscopic procedure(s) in the ambulatory ([facility]) setting.  The nature of the procedure, as well as the risks, benefits, and alternatives were carefully and thoroughly reviewed with the patient. Ample time for discussion and questions allowed. The patient understood, was satisfied, and agreed to proceed.     HPI: Barbara Gross is a 66 y.o. female who presents for colonoscopy.  Medical history as below.  Tolerated the prep.  No recent chest pain or shortness of breath.  No abdominal pain today.  Past Medical History:  Diagnosis Date   ABDOMINAL PAIN, LOWER 05/22/2010   Allergy    pollen    ANXIETY 12/24/2007   on meds   Arthritis    back , shoulder   BACTERIAL VAGINITIS 12/24/2007   BREAST MASS, LEFT 07/15/2008   DEPRESSION 12/24/2007   on meds   DIVERTICULITIS OF COLON 04/21/2010   History of migraine    none in years   [place] 05/31/2007   on meds   HYPERTENSION 05/31/2007   Hypertension    Lesion of bladder    MENOPAUSE, EARLY 01/04/2010   Microscopic hematuria 01/03/2009   PONV (postoperative nausea and vomiting)     Past Surgical History:  Procedure Laterality Date   ABDOMINAL HYSTERECTOMY  1990   COLONOSCOPY  2017   JMP-MAC-good prep-tics-int hems-HPP x 1/TA x 1   CYSTOSCOPY WITH BIOPSY N/A 03/01/2020   Procedure: CYSTOSCOPY WITH BIOPSY, FULGERATION BLADDER;  Surgeon: Robley Fries, MD;  Location: [place];  Service: Urology;  Laterality: N/A;   DILATION AND CURETTAGE OF UTERUS     fibroid   EXPLORATORY LAPAROTOMY     as a teenager- may have taken appendix- pt unsure   OOPHORECTOMY Bilateral 1990   POLYPECTOMY  2017   HPP x 1/ TA x 1   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   TONSILLECTOMY  as child     Prior to Admission medications   Medication Sig Start Date End Date Taking? Authorizing Provider  amLODipine (NORVASC) 5 MG tablet Take 1 tablet (5 mg total) by mouth daily. 05/08/22  Yes Biagio Borg, MD  aspirin 81 MG EC tablet Take 81 mg by mouth daily.   Yes [provider]  citalopram (CELEXA) 10 MG tablet Take 1.5 tablets (15 mg total) by mouth daily. Patient taking differently: Take 10 mg by mouth daily. 05/08/22  Yes Biagio Borg, MD  gabapentin (NEURONTIN) 100 MG capsule Take 2 capsules (200 mg total) by mouth 2 (two) times daily. 05/23/22  Yes Biagio Borg, MD  gabapentin (NEURONTIN) 300 MG capsule Take 1 capsule (300 mg total) by mouth at bedtime. 05/23/22  Yes Biagio Borg, MD  losartan (COZAAR) 50 MG tablet Take 1 tablet (50 mg total) by mouth daily. 05/08/22  Yes Biagio Borg, MD  lovastatin (MEVACOR) 20 MG tablet Take 1 tablet (20 mg total) by mouth at bedtime. 05/08/22  Yes Biagio Borg, MD  meloxicam [facility]) 15 MG tablet Take by mouth. 08/20/22   [provider]    Current Outpatient Medications  Medication Sig Dispense Refill   amLODipine (NORVASC) 5 MG tablet Take 1 tablet (5 mg total) by mouth daily. 90 tablet 3   aspirin 81 MG EC tablet Take 81 mg  by mouth daily.     citalopram (CELEXA) 10 MG tablet Take 1.5 tablets (15 mg total) by mouth daily. (Patient taking differently: Take 10 mg by mouth daily.) 135 tablet 3   gabapentin (NEURONTIN) 100 MG capsule Take 2 capsules (200 mg total) by mouth 2 (two) times daily. 360 capsule 1   gabapentin (NEURONTIN) 300 MG capsule Take 1 capsule (300 mg total) by mouth at bedtime. 90 capsule 1   losartan (COZAAR) 50 MG tablet Take 1 tablet (50 mg total) by mouth daily. 90 tablet 3   lovastatin (MEVACOR) 20 MG tablet Take 1 tablet (20 mg total) by mouth at bedtime. 90 tablet 3   meloxicam (MOBIC) 15 MG tablet Take by mouth.     Current Facility-Administered Medications  Medication Dose Route Frequency Provider Last  Rate Last Admin   0.9 %  sodium chloride infusion  500 mL Intravenous Continuous Adayah Arocho, Lajuan Lines, MD        Allergies as of 08/30/2022 - Review Complete 08/30/2022  Allergen Reaction Noted   Codeine Nausea And Vomiting 04/24/2011   Effexor [venlafaxine] Nausea Only 05/31/2007   Hydrocodone Nausea And Vomiting 04/24/2011    Family History  Problem Relation Age of Onset   Hypertension Mother    Ulcers Mother        bleeding   Hyperlipidemia Mother    Sudden death Mother        bleeding ulcer 1995   Diabetes Father    Hypertension Father    Hyperlipidemia Father    Colon polyps Father    Colon cancer Paternal Grandmother    Cancer Other        prostate   Melanoma Other    Colon cancer Other        dx'd in 80's   Stroke Other    Esophageal cancer Neg Hx    Rectal cancer Neg Hx    Stomach cancer Neg Hx     Social History   Socioeconomic History   Marital status: Single    Spouse name: Not on file   Number of children: Not on file   Years of education: Not on file   Highest education level: Not on file  Occupational History   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  Tobacco Use   Smoking status: Former    Packs/day: 1.00    Years: 15.00    Total pack years: 15.00    Types: Cigarettes   Smokeless tobacco: Never   Tobacco comments:    quit in the 80's   Vaping Use   Vaping Use: Never used  Substance and Sexual Activity   Alcohol use: Not Currently    Alcohol/week: 0.0 - 14.0 standard drinks of alcohol    Comment: 2 glasses of wine a night   Drug use: No   Sexual activity: Not on file  Other Topics Concern   Not on file  Social History Narrative   Not on file   Social Determinants of Health   Financial Resource Strain: Not on file  Food Insecurity: Not on file  Transportation Needs: Not on file  Physical Activity: Not on file  Stress: Not on file  Social Connections: Not on file  Intimate Partner Violence: Not on file    Physical Exam: Vital  signs in last 24 hours: _0  (!) 148/92   Pulse 84   Temp 98.6 F (37 C)   Ht _1  (1.575 m)   Wt 167 lb (75.8 kg)  SpO2 98%   BMI 30.54 kg/m  GEN: NAD EYE: Sclerae anicteric ENT: MMM CV: Non-tachycardic Pulm: CTA b/l GI: Soft, NT/ND NEURO:  Alert & Oriented x 3   Zenovia Jarred, MD [facility]  08/30/2022 8:33 AM

## 2022-08-30 NOTE — Progress Notes (Signed)
Called to room to assist during endoscopic procedure.  Patient ID and intended procedure confirmed with present staff. Received instructions for my participation in the procedure from the performing physician.  

## 2022-08-30 NOTE — Op Note (Signed)
[facility] Patient Name: Barbara Gross Procedure Date: 08/30/2022 8:34 AM MRN: [id number] Endoscopist: Jerene Bears , MD, [id number] Age: 66 Referring MD:  Date of Birth: [birth year] Gender: Female Account #: [id number] Procedure:                Colonoscopy Indications:              High risk colon cancer surveillance: Personal                            history of multiple adenomas + SSP, Last                            colonoscopy: September 2017 Medicines:                Monitored Anesthesia Care Procedure:                Pre-Anesthesia Assessment:                           - Prior to the procedure, a History and Physical                            was performed, and patient medications and                            allergies were reviewed. The patient's tolerance of                            previous anesthesia was also reviewed. The risks                            and benefits of the procedure and the sedation                            options and risks were discussed with the patient.                            All questions were answered, and informed consent                            was obtained. Prior Anticoagulants: The patient has                            taken no anticoagulant or antiplatelet agents. ASA                            Grade Assessment: II - A patient with mild systemic                            disease. After reviewing the risks and benefits,                            the patient was deemed in satisfactory condition to  undergo the procedure.                           After obtaining informed consent, the colonoscope                            was passed under direct vision. Throughout the                            procedure, the patient's blood pressure, pulse, and                            oxygen saturations were monitored continuously. The                            0441 PCF-H190TL Slim SB Colonoscope was  introduced                            through the anus and advanced to the cecum,                            identified by appendiceal orifice and ileocecal                            valve. The colonoscopy was performed without                            difficulty. The patient tolerated the procedure                            well. The quality of the bowel preparation was                            good. The ileocecal valve, appendiceal orifice, and                            rectum were photographed. Scope In: 8:38:30 AM Scope Out: 8:53:59 AM Scope Withdrawal Time: 0 hours 12 minutes 26 seconds  Total Procedure Duration: 0 hours 15 minutes 29 seconds  Findings:                 The digital rectal exam was normal.                           Four sessile polyps were found in the cecum. The                            polyps were 3 to 5 mm in size. These polyps were                            removed with a cold snare. Resection and retrieval                            were complete.  A 4 mm polyp was found in the descending colon. The                            polyp was sessile. The polyp was removed with a                            cold snare. Resection and retrieval were complete.                           A 7 mm polyp was found in the sigmoid colon. The                            polyp was sessile. The polyp was removed with a                            cold snare. Resection and retrieval were complete.                           A 3 mm polyp was found in the rectum. The polyp was                            sessile. The polyp was removed with a cold snare.                            Resection and retrieval were complete.                           Multiple large-mouthed and small-mouthed                            diverticula were found in the sigmoid colon,                            descending colon and ascending colon.                           Internal  hemorrhoids were found during                            retroflexion. The hemorrhoids were small. Complications:            No immediate complications. Estimated Blood Loss:     Estimated blood loss was minimal. Impression:               - Four 3 to 5 mm polyps in the cecum, removed with                            a cold snare. Resected and retrieved.                           - One 4 mm polyp in the descending colon, removed  with a cold snare. Resected and retrieved.                           - One 7 mm polyp in the sigmoid colon, removed with                            a cold snare. Resected and retrieved.                           - One 3 mm polyp in the rectum, removed with a cold                            snare. Resected and retrieved.                           - Severe diverticulosis in the sigmoid colon, in                            the descending colon and mild in the ascending                            colon.                           - Small internal hemorrhoids. Recommendation:           - Patient has a contact number available for                            emergencies. The signs and symptoms of potential                            delayed complications were discussed with the                            patient. Return to normal activities tomorrow.                            Written discharge instructions were provided to the                            patient.                           - Resume previous diet.                           - Continue present medications.                           - Await pathology results.                           - Repeat colonoscopy is recommended for  surveillance. The colonoscopy date will be                            determined after pathology results from today's                            exam become available for review. Jerene Bears, MD 08/30/2022 8:58:23 AM This report has  been signed electronically.

## 2022-08-30 NOTE — Patient Instructions (Signed)
Handouts provided about hemorrhoids, diverticulosis, and polyps.  Resume previous diet.  Continue present medications.  Await pathology results. Repeat colonoscopy date will be determined after pathology results from today's exam become available for review.    YOU HAD AN ENDOSCOPIC PROCEDURE TODAY AT [facility]:   Refer to the procedure report that was given to you for any specific questions about what was found during the examination.  If the procedure report does not answer your questions, please call your gastroenterologist to clarify.  If you requested that your care partner not be given the details of your procedure findings, then the procedure report has been included in a sealed envelope for you to review at your convenience later.  YOU SHOULD EXPECT: Some feelings of bloating in the abdomen. Passage of more gas than usual.  Walking can help get rid of the air that was put into your GI tract during the procedure and reduce the bloating. If you had a lower endoscopy (such as a colonoscopy or flexible sigmoidoscopy) you may notice spotting of blood in your stool or on the toilet paper. If you underwent a bowel prep for your procedure, you may not have a normal bowel movement for a few days.  Please Note:  You might notice some irritation and congestion in your nose or some drainage.  This is from the oxygen used during your procedure.  There is no need for concern and it should clear up in a day or so.  SYMPTOMS TO REPORT IMMEDIATELY:  Following lower endoscopy (colonoscopy or flexible sigmoidoscopy):  Excessive amounts of blood in the stool  Significant tenderness or worsening of abdominal pains  Swelling of the abdomen that is new, acute  Fever of 100F or higher   For urgent or emergent issues, a gastroenterologist can be reached at any hour by calling [phone]. Do not use MyChart messaging for urgent concerns.    DIET:  We do recommend a small meal at first, but  then you may proceed to your regular diet.  Drink plenty of fluids but you should avoid alcoholic beverages for 24 hours.  ACTIVITY:  You should plan to take it easy for the rest of today and you should NOT DRIVE or use heavy machinery until tomorrow (because of the sedation medicines used during the test).    FOLLOW UP: Our staff will call the number listed on your records the next business day following your procedure.  We will call around 7:15- 8:00 am to check on you and address any questions or concerns that you may have regarding the information given to you following your procedure. If we do not reach you, we will leave a message.     If any biopsies were taken you will be contacted by phone or by letter within the next 1-3 weeks.  Please call us at [phone] if you have not heard about the biopsies in 3 weeks.    SIGNATURES/CONFIDENTIALITY: You and/or your care partner have signed paperwork which will be entered into your electronic medical record.  These signatures attest to the fact that that the information above on your After Visit Summary has been reviewed and is understood.  Full responsibility of the confidentiality of this discharge information lies with you and/or your care-partner.

## 2022-08-31 ENCOUNTER — Encounter: Payer: Medicare Other | Admitting: Physical Therapy

## 2022-08-31 ENCOUNTER — Telehealth: Payer: Self-pay | Admitting: *Deleted

## 2022-08-31 NOTE — Telephone Encounter (Signed)
  Follow up Call-     08/30/2022    7:39 AM  Call back number  Post procedure Call Back phone  # [phone]  Permission to leave phone message Yes     Patient questions:  Do you have a fever, pain , or abdominal swelling? No. Pain Score  0 *  Have you tolerated food without any problems? Yes.    Have you been able to return to your normal activities? Yes.    Do you have any questions about your discharge instructions: Diet   No. Medications  No. Follow up visit  No.  Do you have questions or concerns about your Care? No.  Actions: * If pain score is 4 or above: No action needed, pain <4.

## 2022-09-03 DIAGNOSIS — M25562 Pain in left knee: Secondary | ICD-10-CM | POA: Diagnosis not present

## 2022-09-04 ENCOUNTER — Ambulatory Visit (INDEPENDENT_AMBULATORY_CARE_PROVIDER_SITE_OTHER): Payer: Medicare Other

## 2022-09-04 VITALS — Ht 62.0 in | Wt 167.0 lb

## 2022-09-04 DIAGNOSIS — Z0000 Encounter for general adult medical examination without abnormal findings: Secondary | ICD-10-CM | POA: Diagnosis not present

## 2022-09-04 NOTE — Patient Instructions (Signed)
Barbara Gross , Thank you for taking time to come for your Medicare Wellness Visit. I appreciate your ongoing commitment to your health goals. Please review the following plan we discussed and let me know if I can assist you in the future.   These are the goals we discussed:  Goals      My goal is to complete my Advanced Directives next year.        This is a list of the screening recommended for you and due dates:  Health Maintenance  Topic Date Due   COVID-19 Vaccine (6 - 2023-24 season) 07/07/2022   Mammogram  04/12/2023   Medicare Annual Wellness Visit  09/05/2023   Colon Cancer Screening  08/30/2025   DTaP/Tdap/Td vaccine (4 - Td or Tdap) 04/27/2029   Pneumonia Vaccine  Completed   Flu Shot  Completed   DEXA scan (bone density measurement)  Completed   Hepatitis C Screening: USPSTF Recommendation to screen - Ages 5-79 yo.  Completed   Zoster (Shingles) Vaccine  Completed   HPV Vaccine  Aged Out    Advanced directives: Advance directive discussed with you today. I have provided a copy for you to complete at home and have notarized. Once this is complete please bring a copy in to our office so we can scan it into your chart.  Conditions/risks identified: Yes  Next appointment: Follow up in one year for your annual wellness visit.   Preventive Care 45 Years and Older, Female Preventive care refers to lifestyle choices and visits with your health care provider that can promote health and wellness. What does preventive care include? A yearly physical exam. This is also called an annual well check. Dental exams once or twice a year. Routine eye exams. Ask your health care provider how often you should have your eyes checked. Personal lifestyle choices, including: Daily care of your teeth and gums. Regular physical activity. Eating a healthy diet. Avoiding tobacco and drug use. Limiting alcohol use. Practicing safe sex. Taking low-dose aspirin every day. Taking vitamin and  mineral supplements as recommended by your health care provider. What happens during an annual well check? The services and screenings done by your health care provider during your annual well check will depend on your age, overall health, lifestyle risk factors, and family history of disease. Counseling  Your health care provider may ask you questions about your: Alcohol use. Tobacco use. Drug use. Emotional well-being. Home and relationship well-being. Sexual activity. Eating habits. History of falls. Memory and ability to understand (cognition). Work and work Statistician. Reproductive health. Screening  You may have the following tests or measurements: Height, weight, and BMI. Blood pressure. Lipid and cholesterol levels. These may be checked every 5 years, or more frequently if you are over 88 years old. Skin check. Lung cancer screening. You may have this screening every year starting at age 59 if you have a 30-pack-year history of smoking and currently smoke or have quit within the past 15 years. Fecal occult blood test (FOBT) of the stool. You may have this test every year starting at age 61. Flexible sigmoidoscopy or colonoscopy. You may have a sigmoidoscopy every 5 years or a colonoscopy every 10 years starting at age 47. Hepatitis C blood test. Hepatitis B blood test. Sexually transmitted disease (STD) testing. Diabetes screening. This is done by checking your blood sugar (glucose) after you have not eaten for a while (fasting). You may have this done every 1-3 years. Bone density scan. This is  done to screen for osteoporosis. You may have this done starting at age 51. Mammogram. This may be done every 1-2 years. Talk to your health care provider about how often you should have regular mammograms. Talk with your health care provider about your test results, treatment options, and if necessary, the need for more tests. Vaccines  Your health care provider may recommend  certain vaccines, such as: Influenza vaccine. This is recommended every year. Tetanus, diphtheria, and acellular pertussis (Tdap, Td) vaccine. You may need a Td booster every 10 years. Zoster vaccine. You may need this after age 33. Pneumococcal 13-valent conjugate (PCV13) vaccine. One dose is recommended after age 65. Pneumococcal polysaccharide (PPSV23) vaccine. One dose is recommended after age 13. Talk to your health care provider about which screenings and vaccines you need and how often you need them. This information is not intended to replace advice given to you by your health care provider. Make sure you discuss any questions you have with your health care provider. Document Released: 10/14/2015 Document Revised: 06/06/2016 Document Reviewed: 07/19/2015 Elsevier Interactive Patient Education  2017 [place] Prevention in the Home Falls can cause injuries. They can happen to people of all ages. There are many things you can do to make your home safe and to help prevent falls. What can I do on the outside of my home? Regularly fix the edges of walkways and driveways and fix any cracks. Remove anything that might make you trip as you walk through a door, such as a raised step or threshold. Trim any bushes or trees on the path to your home. Use bright outdoor lighting. Clear any walking paths of anything that might make someone trip, such as rocks or tools. Regularly check to see if handrails are loose or broken. Make sure that both sides of any steps have handrails. Any raised decks and porches should have guardrails on the edges. Have any leaves, snow, or ice cleared regularly. Use sand or salt on walking paths during winter. Clean up any spills in your garage right away. This includes oil or grease spills. What can I do in the bathroom? Use night lights. Install grab bars by the toilet and in the tub and shower. Do not use towel bars as grab bars. Use non-skid mats or  decals in the tub or shower. If you need to sit down in the shower, use a plastic, non-slip stool. Keep the floor dry. Clean up any water that spills on the floor as soon as it happens. Remove soap buildup in the tub or shower regularly. Attach bath mats securely with double-sided non-slip rug tape. Do not have throw rugs and other things on the floor that can make you trip. What can I do in the bedroom? Use night lights. Make sure that you have a light by your bed that is easy to reach. Do not use any sheets or blankets that are too big for your bed. They should not hang down onto the floor. Have a firm chair that has side arms. You can use this for support while you get dressed. Do not have throw rugs and other things on the floor that can make you trip. What can I do in the kitchen? Clean up any spills right away. Avoid walking on wet floors. Keep items that you use a lot in easy-to-reach places. If you need to reach something above you, use a strong step stool that has a grab bar. Keep electrical cords out of  the way. Do not use floor polish or wax that makes floors slippery. If you must use wax, use non-skid floor wax. Do not have throw rugs and other things on the floor that can make you trip. What can I do with my stairs? Do not leave any items on the stairs. Make sure that there are handrails on both sides of the stairs and use them. Fix handrails that are broken or loose. Make sure that handrails are as long as the stairways. Check any carpeting to make sure that it is firmly attached to the stairs. Fix any carpet that is loose or worn. Avoid having throw rugs at the top or bottom of the stairs. If you do have throw rugs, attach them to the floor with carpet tape. Make sure that you have a light switch at the top of the stairs and the bottom of the stairs. If you do not have them, ask someone to add them for you. What else can I do to help prevent falls? Wear shoes that: Do not  have high heels. Have rubber bottoms. Are comfortable and fit you well. Are closed at the toe. Do not wear sandals. If you use a stepladder: Make sure that it is fully opened. Do not climb a closed stepladder. Make sure that both sides of the stepladder are locked into place. Ask someone to hold it for you, if possible. Clearly mark and make sure that you can see: Any grab bars or handrails. First and last steps. Where the edge of each step is. Use tools that help you move around (mobility aids) if they are needed. These include: Canes. Walkers. Scooters. Crutches. Turn on the lights when you go into a dark area. Replace any light bulbs as soon as they burn out. Set up your furniture so you have a clear path. Avoid moving your furniture around. If any of your floors are uneven, fix them. If there are any pets around you, be aware of where they are. Review your medicines with your doctor. Some medicines can make you feel dizzy. This can increase your chance of falling. Ask your doctor what other things that you can do to help prevent falls. This information is not intended to replace advice given to you by your health care provider. Make sure you discuss any questions you have with your health care provider. Document Released: 07/14/2009 Document Revised: 02/23/2016 Document Reviewed: 10/22/2014 Elsevier Interactive Patient Education  2017 [employer].

## 2022-09-04 NOTE — Progress Notes (Signed)
Virtual Visit via Telephone Note  I connected with  Barbara Gross on 09/04/22 at 10:15 AM EST by telephone and verified that I am speaking with the correct person using two identifiers.  Location: Patient: Home Provider: [place] Persons participating in the virtual visit: [place]   I discussed the limitations, risks, security and privacy concerns of performing an evaluation and management service by telephone and the availability of in person appointments. The patient expressed understanding and agreed to proceed.  Interactive audio and video telecommunications were attempted between this nurse and patient, however failed, due to patient having technical difficulties OR patient did not have access to video capability.  We continued and completed visit with audio only.  Some vital signs may be absent or patient reported.   Sheral Flow, LPN  Subjective:   Barbara Gross is a 66 y.o. female who presents for an Initial Medicare Annual Wellness Visit.  Review of Systems     Cardiac Risk Factors include: advanced age (>51mn, >>43women);hypertension;family history of premature cardiovascular disease;dyslipidemia;obesity (BMI >30kg/m2);sedentary lifestyle     Objective:    Today's Vitals   09/04/22 1017 09/04/22 1018  Weight: 167 lb (75.8 kg)   Height: _0  (1.575 m)   PainSc: 0-No pain 0-No pain   Body mass index is 30.54 kg/m.     09/04/2022   10:29 AM 09/04/2022   10:19 AM 07/17/2022    7:56 AM 03/01/2020   11:05 AM 06/22/2016    8:18 AM 05/14/2016    8:30 AM 08/18/2015    8:07 AM  Advanced Directives  Does Patient Have a Medical Advance Directive? _1  No No  Would patient like information on creating a medical advance directive? Yes (MAU/Ambulatory/Procedural Areas - Information given) No - Patient declined No - Patient declined No - Patient declined No - patient declined information      Current Medications  (verified) Outpatient Encounter Medications as of 09/04/2022  Medication Sig   amLODipine (NORVASC) 5 MG tablet Take 1 tablet (5 mg total) by mouth daily.   aspirin 81 MG EC tablet Take 81 mg by mouth daily.   citalopram (CELEXA) 10 MG tablet Take 1.5 tablets (15 mg total) by mouth daily. (Patient taking differently: Take 10 mg by mouth daily.)   gabapentin (NEURONTIN) 100 MG capsule Take 2 capsules (200 mg total) by mouth 2 (two) times daily.   gabapentin (NEURONTIN) 300 MG capsule Take 1 capsule (300 mg total) by mouth at bedtime.   losartan (COZAAR) 50 MG tablet Take 1 tablet (50 mg total) by mouth daily.   lovastatin (MEVACOR) 20 MG tablet Take 1 tablet (20 mg total) by mouth at bedtime.   meloxicam (MOBIC) 15 MG tablet Take by mouth.   No facility-administered encounter medications on file as of 09/04/2022.    Allergies (verified) Codeine, Effexor [venlafaxine], and Hydrocodone   History: Past Medical History:  Diagnosis Date   ABDOMINAL PAIN, LOWER 05/22/2010   Allergy    pollen    ANXIETY 12/24/2007   on meds   Arthritis    back , shoulder   BACTERIAL VAGINITIS 12/24/2007   BREAST MASS, LEFT 07/15/2008   DEPRESSION 12/24/2007   on meds   DIVERTICULITIS OF COLON 04/21/2010   History of migraine    none in years   [place]   [place] meds   HYPERTENSION 05/31/2007   Hypertension    Lesion of bladder    MENOPAUSE, EARLY 01/04/2010  Microscopic hematuria 01/03/2009   PONV (postoperative nausea and vomiting)    Past Surgical History:  Procedure Laterality Date   ABDOMINAL HYSTERECTOMY  1990   COLONOSCOPY  2017   JMP-MAC-good prep-tics-int hems-HPP x 1/TA x 1   CYSTOSCOPY WITH BIOPSY N/A 03/01/2020   Procedure: CYSTOSCOPY WITH BIOPSY, FULGERATION BLADDER;  Surgeon: Robley Fries, MD;  Location: [facility];  Service: Urology;  Laterality: N/A;   DILATION AND CURETTAGE OF UTERUS     fibroid   EXPLORATORY LAPAROTOMY     as a teenager-  may have taken appendix- pt unsure   OOPHORECTOMY Bilateral 1990   POLYPECTOMY  2017   HPP x 1/ TA x 1   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   TONSILLECTOMY  as child   Family History  Problem Relation Age of Onset   Hypertension Mother    Ulcers Mother        bleeding   Hyperlipidemia Mother    Sudden death Mother        bleeding ulcer 1995   Diabetes Father    Hypertension Father    Hyperlipidemia Father    Colon polyps Father    Colon cancer Paternal Grandmother    Cancer Other        prostate   Melanoma Other    Colon cancer Other        dx'd in 80's   Stroke Other    Esophageal cancer Neg Hx    Rectal cancer Neg Hx    Stomach cancer Neg Hx    Social History   Socioeconomic History   Marital status: Single    Spouse name: Not on file   Number of children: Not on file   Years of education: Not on file   Highest education level: Not on file  Occupational History   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  Tobacco Use   Smoking status: Former    Packs/day: 1.00    Years: 15.00    Total pack years: 15.00    Types: Cigarettes   Smokeless tobacco: Never   Tobacco comments:    quit in the 80's   Vaping Use   Vaping Use: Never used  Substance and Sexual Activity   Alcohol use: Not Currently    Alcohol/week: 0.0 - 14.0 standard drinks of alcohol    Comment: 2 glasses of wine a night   Drug use: No   Sexual activity: Not on file  Other Topics Concern   Not on file  Social History Narrative   Not on file   Social Determinants of Health   Financial Resource Strain: Low Risk  (09/04/2022)   Overall Financial Resource Strain (CARDIA)    Difficulty of Paying Living Expenses: Not hard at all  Food Insecurity: No Food Insecurity (09/04/2022)   Hunger Vital Sign    Worried About Running Out of Food in the Last Year: Never true    Ran Out of Food in the Last Year: Never true  Transportation Needs: No Transportation Needs (09/04/2022)   PRAPARE -  Hydrologist (Medical): No    Lack of Transportation (Non-Medical): No  Physical Activity: Inactive (09/04/2022)   Exercise Vital Sign    Days of Exercise per Week: 0 days    Minutes of Exercise per Session: 0 min  Stress: No Stress Concern Present (09/04/2022)   [place]    Feeling of Stress : Not  at all  Social Connections: Unknown (09/04/2022)   Social Connection and Isolation Panel [NHANES]    Frequency of Communication with Friends and Family: Patient refused    Frequency of Social Gatherings with Friends and Family: Patient refused    Attends Religious Services: Patient refused    Marine scientist or Organizations: Patient refused    Attends Music therapist: Patient refused    Marital Status: Patient refused    Tobacco Counseling Counseling given: Not Answered Tobacco comments: quit in the 80's    Clinical Intake:  Pre-visit preparation completed: Yes  Pain : No/denies pain Pain Score: 0-No pain     BMI - recorded: 30.54 Nutritional Status: BMI > 30  Obese Nutritional Risks: None Diabetes: No  How often do you need to have someone help you when you read instructions, pamphlets, or other written materials from your doctor or pharmacy?: 1 - Never What is the last grade level you completed in school?: Associate's degree  Diabetic? No  Interpreter Needed?: No  Information entered by :: Lisette Abu, LPN.   Activities of Daily Living    09/04/2022   10:26 AM  In your present state of health, do you have any difficulty performing the following activities:  Hearing? 1  Comment no hearing aids  Vision? 0  Difficulty concentrating or making decisions? 0  Walking or climbing stairs? 0  Dressing or bathing? 0  Doing errands, shopping? 0  Preparing Food and eating ? N  Using the Toilet? N  In the past six months, have you accidently leaked urine?  N  Do you have problems with loss of bowel control? N  Managing your Medications? N  Managing your Finances? N  Housekeeping or managing your Housekeeping? N    Patient Care Team: Biagio Borg, MD as PCP - General  Indicate any recent Medical Services you may have received from other than [facility] providers in the past year (date may be approximate).     Assessment:   This is a routine wellness examination for Bunny.  Hearing/Vision screen Hearing Screening - Comments:: Issues with hearing; no hearing aids. Vision Screening - Comments:: Patient wears readers; no eye recent eye exam.  Dietary issues and exercise activities discussed: Current Exercise Habits: The patient does not participate in regular exercise at present, Exercise limited by: None identified   Goals Addressed             This Visit's Progress    My goal is to complete my Advanced Directives next year.        Depression Screen    09/04/2022   10:22 AM 05/23/2022    4:11 PM 05/08/2022    9:12 AM 07/26/2021    9:43 AM 04/28/2020    9:23 AM 04/28/2019    8:47 AM 04/23/2018    3:04 PM  PHQ 2/9 Scores  PHQ - 2 Score 0 0 0 0 0 0 1  PHQ- 9 Score  3 0        Fall Risk    09/04/2022   10:20 AM 05/23/2022    4:10 PM 05/08/2022    9:11 AM 07/26/2021    9:42 AM 04/28/2020    9:23 AM  Fall Risk   Falls in the past year? _0 0 0  Number falls in past yr: 0 1 1 0   Injury with Fall? 0 0 0 0   Risk for fall due to : No Fall Risks History of  fall(s) History of fall(s)    Risk for fall due to: Comment  had a knee replacement     Follow up Falls prevention discussed Falls evaluation completed Falls evaluation completed      [place]:  Any stairs in or around the home? No  If so, are there any without handrails? No  Home free of loose throw rugs in walkways, pet beds, electrical cords, etc? Yes  Adequate lighting in your home to reduce risk of falls? Yes   ASSISTIVE DEVICES  UTILIZED TO PREVENT FALLS:  Life alert? No  Use of a cane, walker or w/c? No  Grab bars in the bathroom? No  Shower chair or bench in shower? No  Elevated toilet seat or a handicapped toilet? No   TIMED UP AND GO:  Was the test performed? No . Phone Visit   Cognitive Function:        09/04/2022   10:27 AM  6CIT Screen  What Year? 0 points  What month? 0 points  What time? 0 points  Count back from 20 0 points  Months in reverse 0 points  Repeat phrase 0 points  Total Score 0 points    Immunizations Immunization History  Administered Date(s) Administered   Fluad Quad(high Dose 65+) 07/26/2021   Influenza Whole 09/03/2002   Influenza,inj,Quad PF,6+ Mos 07/16/2019   Influenza-Unspecified 07/21/2018, 05/12/2022   PFIZER Comirnaty(Gray Top)Covid-19 Tri-Sucrose Vaccine 01/09/2021   PFIZER(Purple Top)SARS-COV-2 Vaccination 12/24/2019, 01/21/2020, 08/18/2020   PNEUMOCOCCAL CONJUGATE-20 05/08/2022   Pfizer Covid-19 Vaccine Bivalent Booster 39yr & up 05/12/2022   Td 10/02/1995, 01/03/2009   Tdap 04/28/2019   Zoster Recombinat (Shingrix) 08/28/2019, 10/05/2019    TDAP status: Up to date  Flu Vaccine status: Up to date  Pneumococcal vaccine status: Up to date  Covid-19 vaccine status: Completed vaccines  Qualifies for Shingles Vaccine? Yes   Zostavax completed No   Shingrix Completed?: Yes  Screening Tests Health Maintenance  Topic Date Due   COVID-19 Vaccine (6 - 2023-24 season) 07/07/2022   MAMMOGRAM  04/12/2023   Medicare Annual Wellness (AWV)  09/05/2023   COLONOSCOPY (Pts 45-448yrInsurance coverage will need to be confirmed)  08/30/2025   DTaP/Tdap/Td (4 - Td or Tdap) 04/27/2029   Pneumonia Vaccine 6553Years old  Completed   INFLUENZA VACCINE  Completed   DEXA SCAN  Completed   Hepatitis C Screening  Completed   Zoster Vaccines- Shingrix  Completed   HPV VACCINES  Aged Out    Health Maintenance  Health Maintenance Due  Topic Date Due   COVID-19  Vaccine (6 - 2023-24 season) 07/07/2022    Colorectal cancer screening: Type of screening: Colonoscopy. Completed 08/30/2022. Repeat every 3 years  Mammogram status: Completed 03/2022. Repeat every year  Bone Density status: Completed 05/15/2022. Results reflect: Bone density results: OSTEOPENIA. Repeat every 2-3 years.  Lung Cancer Screening: (Low Dose CT Chest recommended if Age 66-80ears, 30 pack-year currently smoking OR have quit w/in 15years.) does not qualify.   Lung Cancer Screening Referral: no  Additional Screening:  Hepatitis C Screening: does qualify; Completed 04/02/2017  Vision Screening: Recommended annual ophthalmology exams for early detection of glaucoma and other disorders of the eye. Is the patient up to date with their annual eye exam?  No  Who is the provider or what is the name of the office in which the patient attends annual eye exams? Patient declined If pt is not established with a provider, would they like  to be referred to a provider to establish care? No .   Dental Screening: Recommended annual dental exams for proper oral hygiene  Community Resource Referral / Chronic Care Management: CRR required this visit?  No   CCM required this visit?  No      Plan:     I have personally reviewed and noted the following in the patient's chart:   Medical and social history Use of alcohol, tobacco or illicit drugs  Current medications and supplements including opioid prescriptions. Patient is not currently taking opioid prescriptions. Functional ability and status Nutritional status Physical activity Advanced directives List of other physicians Hospitalizations, surgeries, and ER visits in previous 12 months Vitals Screenings to include cognitive, depression, and falls Referrals and appointments  In addition, I have reviewed and discussed with patient certain preventive protocols, quality metrics, and best practice recommendations. A written  personalized care plan for preventive services as well as general preventive health recommendations were provided to patient.     Sheral Flow, LPN   [id number]   Nurse Notes:  Advanced Directives provided to patient to complete.

## 2022-09-05 ENCOUNTER — Ambulatory Visit: Payer: Medicare Other | Admitting: Rehabilitative and Restorative Service Providers"

## 2022-09-05 ENCOUNTER — Encounter: Payer: Self-pay | Admitting: Internal Medicine

## 2022-10-15 DIAGNOSIS — M25561 Pain in right knee: Secondary | ICD-10-CM | POA: Diagnosis not present

## 2022-10-15 DIAGNOSIS — M25562 Pain in left knee: Secondary | ICD-10-CM | POA: Diagnosis not present

## 2022-12-27 DIAGNOSIS — L821 Other seborrheic keratosis: Secondary | ICD-10-CM | POA: Diagnosis not present

## 2022-12-27 DIAGNOSIS — L538 Other specified erythematous conditions: Secondary | ICD-10-CM | POA: Diagnosis not present

## 2022-12-27 DIAGNOSIS — L820 Inflamed seborrheic keratosis: Secondary | ICD-10-CM | POA: Diagnosis not present

## 2023-01-18 ENCOUNTER — Other Ambulatory Visit: Payer: Self-pay | Admitting: Internal Medicine

## 2023-01-24 ENCOUNTER — Other Ambulatory Visit: Payer: Self-pay

## 2023-01-25 ENCOUNTER — Other Ambulatory Visit: Payer: Self-pay

## 2023-04-23 DIAGNOSIS — Z1231 Encounter for screening mammogram for malignant neoplasm of breast: Secondary | ICD-10-CM | POA: Diagnosis not present

## 2023-04-23 LAB — HM MAMMOGRAPHY

## 2023-05-01 ENCOUNTER — Encounter (INDEPENDENT_AMBULATORY_CARE_PROVIDER_SITE_OTHER): Payer: Self-pay

## 2023-05-09 ENCOUNTER — Other Ambulatory Visit (INDEPENDENT_AMBULATORY_CARE_PROVIDER_SITE_OTHER): Payer: Medicare Other

## 2023-05-09 ENCOUNTER — Ambulatory Visit: Payer: Medicare Other

## 2023-05-09 DIAGNOSIS — E538 Deficiency of other specified B group vitamins: Secondary | ICD-10-CM

## 2023-05-09 DIAGNOSIS — R739 Hyperglycemia, unspecified: Secondary | ICD-10-CM | POA: Diagnosis not present

## 2023-05-09 DIAGNOSIS — E559 Vitamin D deficiency, unspecified: Secondary | ICD-10-CM

## 2023-05-09 DIAGNOSIS — E7800 Pure hypercholesterolemia, unspecified: Secondary | ICD-10-CM | POA: Diagnosis not present

## 2023-05-09 LAB — BASIC METABOLIC PANEL
BUN: 16 mg/dL (ref 6–23)
CO2: 28 mEq/L (ref 19–32)
Calcium: 9.8 mg/dL (ref 8.4–10.5)
Chloride: 102 mEq/L (ref 96–112)
Creatinine, Ser: 0.68 mg/dL (ref 0.40–1.20)
GFR: 90.5 mL/min (ref >60.00)
Glucose, Bld: 104 mg/dL — ABNORMAL HIGH (ref 70–99)
Potassium: 4.2 mEq/L (ref 3.5–5.1)
Sodium: 139 mEq/L (ref 135–145)

## 2023-05-09 LAB — CBC WITH DIFFERENTIAL/PLATELET
Basophils Absolute: 0 10*3/uL (ref 0.0–0.1)
Basophils Relative: 0.4 % (ref 0.0–3.0)
Eosinophils Absolute: 0.2 10*3/uL (ref 0.0–0.7)
Eosinophils Relative: 2 % (ref 0.0–5.0)
HCT: 44.9 % (ref 36.0–46.0)
Hemoglobin: 14.9 g/dL (ref 12.0–15.0)
Lymphocytes Relative: 35 % (ref 12.0–46.0)
Lymphs Abs: 2.7 10*3/uL (ref 0.7–4.0)
MCHC: 33.2 g/dL (ref 30.0–36.0)
MCV: 101.7 fl — ABNORMAL HIGH (ref 78.0–100.0)
Monocytes Absolute: 0.7 10*3/uL (ref 0.1–1.0)
Monocytes Relative: 9.5 % (ref 3.0–12.0)
Neutro Abs: 4.1 10*3/uL (ref 1.4–7.7)
Neutrophils Relative %: 53.1 % (ref 43.0–77.0)
Platelets: 336 10*3/uL (ref 150.0–400.0)
RBC: 4.41 Mil/uL (ref 3.87–5.11)
RDW: 13.3 % (ref 11.5–15.5)
WBC: 7.6 10*3/uL (ref 4.0–10.5)

## 2023-05-09 LAB — URINALYSIS, ROUTINE W REFLEX MICROSCOPIC
Bilirubin Urine: NEGATIVE
Ketones, ur: NEGATIVE
Leukocytes,Ua: NEGATIVE
Nitrite: NEGATIVE
Specific Gravity, Urine: 1.02 (ref 1.000–1.030)
Total Protein, Urine: NEGATIVE
Urine Glucose: NEGATIVE
Urobilinogen, UA: 0.2 (ref 0.0–1.0)
pH: 6 (ref 5.0–8.0)

## 2023-05-09 LAB — LIPID PANEL
Cholesterol: 170 mg/dL (ref 0–200)
HDL: 70.1 mg/dL (ref >39.00)
LDL Cholesterol: 79 mg/dL (ref 0–99)
NonHDL: 100.09
Total CHOL/HDL Ratio: 2
Triglycerides: 105 mg/dL (ref 0.0–149.0)
VLDL: 21 mg/dL (ref 0.0–40.0)

## 2023-05-09 LAB — VITAMIN D 25 HYDROXY (VIT D DEFICIENCY, FRACTURES): VITD: 72.02 ng/mL (ref 30.00–100.00)

## 2023-05-09 LAB — HEPATIC FUNCTION PANEL
ALT: 42 U/L — ABNORMAL HIGH (ref 0–35)
AST: 32 U/L (ref 0–37)
Albumin: 4.6 g/dL (ref 3.5–5.2)
Alkaline Phosphatase: 56 U/L (ref 39–117)
Bilirubin, Direct: 0.1 mg/dL (ref 0.0–0.3)
Total Bilirubin: 0.6 mg/dL (ref 0.2–1.2)
Total Protein: 7.5 g/dL (ref 6.0–8.3)

## 2023-05-09 LAB — VITAMIN B12: Vitamin B-12: 714 pg/mL (ref 211–911)

## 2023-05-09 LAB — HEMOGLOBIN A1C: Hgb A1c MFr Bld: 5.7 % (ref 4.6–6.5)

## 2023-05-09 LAB — TSH: TSH: 0.78 u[IU]/mL (ref 0.35–5.50)

## 2023-05-14 ENCOUNTER — Ambulatory Visit (INDEPENDENT_AMBULATORY_CARE_PROVIDER_SITE_OTHER): Payer: Medicare Other | Admitting: Internal Medicine

## 2023-05-14 ENCOUNTER — Encounter: Payer: Self-pay | Admitting: Internal Medicine

## 2023-05-14 VITALS — BP 130/80 | HR 90 | Temp 98.9°F | Ht 62.0 in | Wt 172.0 lb

## 2023-05-14 DIAGNOSIS — R739 Hyperglycemia, unspecified: Secondary | ICD-10-CM

## 2023-05-14 DIAGNOSIS — Z0000 Encounter for general adult medical examination without abnormal findings: Secondary | ICD-10-CM | POA: Diagnosis not present

## 2023-05-14 DIAGNOSIS — E559 Vitamin D deficiency, unspecified: Secondary | ICD-10-CM

## 2023-05-14 DIAGNOSIS — E538 Deficiency of other specified B group vitamins: Secondary | ICD-10-CM

## 2023-05-14 DIAGNOSIS — M25562 Pain in left knee: Secondary | ICD-10-CM | POA: Diagnosis not present

## 2023-05-14 DIAGNOSIS — E7800 Pure hypercholesterolemia, unspecified: Secondary | ICD-10-CM | POA: Diagnosis not present

## 2023-05-14 DIAGNOSIS — I10 Essential (primary) hypertension: Secondary | ICD-10-CM

## 2023-05-14 DIAGNOSIS — Z0001 Encounter for general adult medical examination with abnormal findings: Secondary | ICD-10-CM

## 2023-05-14 MED ORDER — LOVASTATIN 20 MG PO TABS: 20.0000 mg | ORAL_TABLET | Freq: Every day | ORAL | 3 refills | Status: DC

## 2023-05-14 MED ORDER — LOSARTAN POTASSIUM 50 MG PO TABS: 50.0000 mg | ORAL_TABLET | Freq: Every day | ORAL | 3 refills | Status: DC

## 2023-05-14 MED ORDER — CITALOPRAM HYDROBROMIDE 10 MG PO TABS: 10.0000 mg | ORAL_TABLET | Freq: Every day | ORAL | 3 refills | Status: DC

## 2023-05-14 MED ORDER — AMLODIPINE BESYLATE 5 MG PO TABS: 5.0000 mg | ORAL_TABLET | Freq: Every day | ORAL | 3 refills | Status: DC

## 2023-05-14 NOTE — Progress Notes (Unsigned)
Patient ID: Barbara Gross, female   DOB: 1956-09-06, 67 y.o.   MRN: [id number]         Chief Complaint:: wellness exam and left knee pain and chronic lbp, hd, htn, hyperglycemia, low vit d        HPI:  Barbara Gross is a 67 y.o. female here for wellness exam; decllines covid booster, for flu shot at pharmacy, o/w up to date                        Also starting to use home exercise bike, has new left knee TKR without pain seems "not quite right" and has overall gained several lbs.   Pt continues to have recurring LBP without change in severity, bowel or bladder change, fever, wt loss,  worsening LE pain/numbness/weakness, gait change or falls.  More noticeable lately with sitting too long at work.  Plans to retire end this month.  Pt denies chest pain, increased sob or doe, wheezing, orthopnea, PND, increased LE swelling, palpitations, dizziness or syncope.   Pt denies polydipsia, polyuria, or new focal neuro s/s.    Pt denies fever, wt loss, night sweats, loss of appetite, or other constitutional symptoms    BP Readings from Last 3 Encounters:  05/14/23 130/80  08/30/22 (!) 166/100  05/23/22 (!) 134/90   Immunization History  Administered Date(s) Administered   Fluad Quad(high Dose 65+) 07/26/2021   Influenza Whole 09/03/2002   Influenza,inj,Quad PF,6+ Mos 07/16/2019   Influenza-Unspecified 07/21/2018, 05/12/2022   PFIZER Comirnaty(Gray Top)Covid-19 Tri-Sucrose Vaccine 01/09/2021   PFIZER(Purple Top)SARS-COV-2 Vaccination 12/24/2019, 01/21/2020, 08/18/2020   PNEUMOCOCCAL CONJUGATE-20 05/08/2022   Pfizer Covid-19 Vaccine Bivalent Booster 68yrs & up 05/12/2022   Td 10/02/1995, 01/03/2009   Tdap 04/28/2019   Zoster Recombinant(Shingrix) 08/28/2019, 10/05/2019   Health Maintenance Due  Topic Date Due   COVID-19 Vaccine (6 - 2023-24 season) 07/07/2022   INFLUENZA VACCINE  05/02/2023      Past Medical History:  Diagnosis Date   ABDOMINAL PAIN, LOWER 05/22/2010   Allergy     pollen    ANXIETY 12/24/2007   on meds   Arthritis    back , shoulder   BACTERIAL VAGINITIS 12/24/2007   BREAST MASS, LEFT 07/15/2008   DEPRESSION 12/24/2007   on meds   DIVERTICULITIS OF COLON 04/21/2010   History of migraine    none in years   HYPERLIPIDEMIA 05/31/2007   on meds   HYPERTENSION 05/31/2007   Hypertension    Lesion of bladder    MENOPAUSE, EARLY 01/04/2010   Microscopic hematuria 01/03/2009   PONV (postoperative nausea and vomiting)    Past Surgical History:  Procedure Laterality Date   ABDOMINAL HYSTERECTOMY  1990   COLONOSCOPY  2017   JMP-MAC-good prep-tics-int hems-HPP x 1/TA x 1   CYSTOSCOPY WITH BIOPSY N/A 03/01/2020   Procedure: CYSTOSCOPY WITH BIOPSY, FULGERATION BLADDER;  Surgeon: Noel Christmas, MD;  Location: [facility];  Service: Urology;  Laterality: N/A;   DILATION AND CURETTAGE OF UTERUS     fibroid   EXPLORATORY LAPAROTOMY     as a teenager- may have taken appendix- pt unsure   OOPHORECTOMY Bilateral 1990   POLYPECTOMY  2017   HPP x 1/ TA x 1   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 Her smoking use included cigarettes. She has a 15 pack-year smoking history. She has never used  smokeless tobacco. She reports that she does not currently use alcohol. She reports that she does not use drugs. family history includes Cancer in an other family member; Colon cancer in her paternal grandmother and another family member; Colon polyps in her father; Diabetes in her father; Hyperlipidemia in her father and mother; Hypertension in her father and mother; Melanoma in an other family member; Stroke in an other family member; Sudden death in her mother; Ulcers in her mother. Allergies  Allergen Reactions   Codeine Nausea And Vomiting   Effexor [Venlafaxine] Nausea Only   Hydrocodone Nausea And Vomiting   Current Outpatient Medications on File Prior to Visit  Medication Sig  Dispense Refill   aspirin 81 MG EC tablet Take 81 mg by mouth daily.     gabapentin (NEURONTIN) 100 MG capsule Take 2 capsules (200 mg total) by mouth 2 (two) times daily. 360 capsule 1   gabapentin (NEURONTIN) 300 MG capsule TAKE 1 CAPSULE BY MOUTH AT BEDTIME 90 capsule 1   meloxicam (MOBIC) 15 MG tablet Take by mouth.     No current facility-administered medications on file prior to visit.        ROS:  All others reviewed and negative.  Objective        PE:  BP 130/80 (BP Location: Right Arm, Patient Position: Sitting, Cuff Size: Normal)   Pulse 90   Temp 98.9 F (37.2 C) (Oral)   Ht 5\' 2"  (1.575 m)   Wt 172 lb (78 kg)   SpO2 97%   BMI 31.46 kg/m                 Constitutional: Pt appears in NAD               HENT: Head: NCAT.                Right Ear: External ear normal.                 Left Ear: External ear normal.                Eyes: . Pupils are equal, round, and reactive to light. Conjunctivae and EOM are normal               Nose: without d/c or deformity               Neck: Neck supple. Gross normal ROM               Cardiovascular: Normal rate and regular rhythm.                 Pulmonary/Chest: Effort normal and breath sounds without rales or wheezing.                Abd:  Soft, NT, ND, + BS, no organomegaly               Neurological: Pt is alert. At baseline orientation, motor grossly intact               Skin: Skin is warm. No rashes, no other new lesions, LE edema - none               Psychiatric: Pt behavior is normal without agitation   Micro: none  Cardiac tracings I have personally interpreted today:  none  Pertinent Radiological findings (summarize): none   Lab Results  Component Value Date   WBC 7.6 05/09/2023   HGB 14.9 05/09/2023   HCT 44.9  05/09/2023   PLT 336.0 05/09/2023   GLUCOSE 104 (H) 05/09/2023   CHOL 170 05/09/2023   TRIG 105.0 05/09/2023   HDL 70.10 05/09/2023   LDLCALC 79 05/09/2023   ALT 42 (H) 05/09/2023   AST 32 05/09/2023    NA 139 05/09/2023   K 4.2 05/09/2023   CL 102 05/09/2023   CREATININE 0.68 05/09/2023   BUN 16 05/09/2023   CO2 28 05/09/2023   TSH 0.78 05/09/2023   HGBA1C 5.7 05/09/2023   Assessment/Plan:  Barbara Gross is a 67 y.o. White or Caucasian [1] female with  has a past medical history of ABDOMINAL PAIN, LOWER (05/22/2010), Allergy, ANXIETY (12/24/2007), Arthritis, BACTERIAL VAGINITIS (12/24/2007), BREAST MASS, LEFT (07/15/2008), DEPRESSION (12/24/2007), DIVERTICULITIS OF COLON (04/21/2010), History of migraine, HYPERLIPIDEMIA (05/31/2007), HYPERTENSION (05/31/2007), Hypertension, Lesion of bladder, MENOPAUSE, EARLY (01/04/2010), Microscopic hematuria (01/03/2009), and PONV (postoperative nausea and vomiting).  Encounter for well adult exam with abnormal findings Age and sex appropriate education and counseling updated with regular exercise and diet Referrals for preventative services - none needed Immunizations addressed - declines covid booster, for flu shot at pharmacy Smoking counseling  - none needed Evidence for depression or other mood disorder - none significant Most recent labs reviewed. I have personally reviewed and have noted: 1) the patient's medical and social history 2) The patient's current medications and supplements 3) The patient's height, weight, and BMI have been recorded in the chart   HLD (hyperlipidemia) Lab Results  Component Value Date   LDLCALC 79 05/09/2023   Stable, pt to continue current statin lovsastatin 20 mg   Essential hypertension BP Readings from Last 3 Encounters:  05/14/23 130/80  08/30/22 (!) 166/100  05/23/22 (!) 134/90   Stable, pt to continue medical treatment norvasc 5 every day, losartan 50 qd   Hyperglycemia Lab Results  Component Value Date   HGBA1C 5.7 05/09/2023   Stable, pt to continue current medical treatment  - diet, wt control   Vitamin D deficiency Last vitamin D Lab Results  Component Value Date   VD25OH  72.02 05/09/2023   Stable, cont oral replacement   Left knee pain S/p left TKR with mild persistent pain - for otc volt gel prn  Followup: Return in about 1 year (around 05/13/2024).  Oliver Barre, MD 05/15/2023 9:19 PM [facility]

## 2023-05-14 NOTE — Patient Instructions (Signed)
Ok to use the OTC Voltaren gel as needed for pain  Please continue all other medications as before, and refills have been done if requested.  Please have the pharmacy call with any other refills you may need.  Please continue your efforts at being more active, low cholesterol diet, and weight control.  You are otherwise up to date with prevention measures today.  Please keep your appointments with your specialists as you may have planned  Your lab work was good today  Please make an Appointment to return for your 1 year visit, or sooner if needed, with Lab testing by Appointment as well, to be done about 3-5 days before at the FIRST FLOOR Lab (so this is for TWO appointments - please see the scheduling desk as you leave)

## 2023-05-15 ENCOUNTER — Encounter: Payer: Self-pay | Admitting: Internal Medicine

## 2023-05-15 NOTE — Assessment & Plan Note (Signed)
Lab Results  Component Value Date   LDLCALC 79 05/09/2023   Stable, pt to continue current statin lovsastatin 20 mg

## 2023-05-15 NOTE — Assessment & Plan Note (Signed)
S/p left TKR with mild persistent pain - for otc volt gel prn

## 2023-05-15 NOTE — Assessment & Plan Note (Signed)
Lab Results  Component Value Date   HGBA1C 5.7 05/09/2023   Stable, pt to continue current medical treatment  - diet, wt control

## 2023-05-15 NOTE — Assessment & Plan Note (Signed)
Age and sex appropriate education and counseling updated with regular exercise and diet Referrals for preventative services - none needed Immunizations addressed - declines covid booster, for flu shot at pharmacy Smoking counseling  - none needed Evidence for depression or other mood disorder - none significant Most recent labs reviewed. I have personally reviewed and have noted: 1) the patient's medical and social history 2) The patient's current medications and supplements 3) The patient's height, weight, and BMI have been recorded in the chart

## 2023-05-15 NOTE — Assessment & Plan Note (Signed)
Last vitamin D Lab Results  Component Value Date   VD25OH 72.02 05/09/2023   Stable, cont oral replacement

## 2023-05-15 NOTE — Assessment & Plan Note (Signed)
BP Readings from Last 3 Encounters:  05/14/23 130/80  08/30/22 (!) 166/100  05/23/22 (!) 134/90   Stable, pt to continue medical treatment norvasc 5 every day, losartan 50 qd

## 2023-06-12 ENCOUNTER — Other Ambulatory Visit: Payer: Self-pay | Admitting: Internal Medicine

## 2023-07-18 ENCOUNTER — Other Ambulatory Visit: Payer: Self-pay

## 2023-07-18 ENCOUNTER — Other Ambulatory Visit: Payer: Self-pay | Admitting: Internal Medicine

## 2023-07-23 ENCOUNTER — Encounter: Payer: Medicare Other | Admitting: Physical Therapy

## 2023-08-02 DIAGNOSIS — L821 Other seborrheic keratosis: Secondary | ICD-10-CM | POA: Diagnosis not present

## 2023-08-02 DIAGNOSIS — D485 Neoplasm of uncertain behavior of skin: Secondary | ICD-10-CM | POA: Diagnosis not present

## 2023-08-02 DIAGNOSIS — L820 Inflamed seborrheic keratosis: Secondary | ICD-10-CM | POA: Diagnosis not present

## 2023-08-02 DIAGNOSIS — L218 Other seborrheic dermatitis: Secondary | ICD-10-CM | POA: Diagnosis not present

## 2023-08-02 DIAGNOSIS — L2989 Other pruritus: Secondary | ICD-10-CM | POA: Diagnosis not present

## 2023-08-02 DIAGNOSIS — L538 Other specified erythematous conditions: Secondary | ICD-10-CM | POA: Diagnosis not present

## 2023-09-06 ENCOUNTER — Other Ambulatory Visit: Payer: Self-pay

## 2023-09-06 ENCOUNTER — Other Ambulatory Visit: Payer: Self-pay | Admitting: Internal Medicine

## 2023-09-13 DIAGNOSIS — L538 Other specified erythematous conditions: Secondary | ICD-10-CM | POA: Diagnosis not present

## 2023-09-13 DIAGNOSIS — L820 Inflamed seborrheic keratosis: Secondary | ICD-10-CM | POA: Diagnosis not present

## 2023-09-13 DIAGNOSIS — L218 Other seborrheic dermatitis: Secondary | ICD-10-CM | POA: Diagnosis not present

## 2023-11-14 DIAGNOSIS — L2989 Other pruritus: Secondary | ICD-10-CM | POA: Diagnosis not present

## 2023-11-14 DIAGNOSIS — L821 Other seborrheic keratosis: Secondary | ICD-10-CM | POA: Diagnosis not present

## 2023-11-14 DIAGNOSIS — L820 Inflamed seborrheic keratosis: Secondary | ICD-10-CM | POA: Diagnosis not present

## 2023-11-14 DIAGNOSIS — L538 Other specified erythematous conditions: Secondary | ICD-10-CM | POA: Diagnosis not present

## 2023-12-11 ENCOUNTER — Encounter: Payer: Self-pay | Admitting: Internal Medicine

## 2023-12-26 DIAGNOSIS — L538 Other specified erythematous conditions: Secondary | ICD-10-CM | POA: Diagnosis not present

## 2023-12-26 DIAGNOSIS — L2989 Other pruritus: Secondary | ICD-10-CM | POA: Diagnosis not present

## 2023-12-26 DIAGNOSIS — H53451 Other localized visual field defect, right eye: Secondary | ICD-10-CM | POA: Diagnosis not present

## 2023-12-26 DIAGNOSIS — L820 Inflamed seborrheic keratosis: Secondary | ICD-10-CM | POA: Diagnosis not present

## 2024-01-10 ENCOUNTER — Other Ambulatory Visit: Payer: Self-pay

## 2024-01-10 ENCOUNTER — Other Ambulatory Visit: Payer: Self-pay | Admitting: Internal Medicine

## 2024-01-13 ENCOUNTER — Ambulatory Visit (INDEPENDENT_AMBULATORY_CARE_PROVIDER_SITE_OTHER)

## 2024-01-13 VITALS — BP 128/70 | HR 85 | Ht 63.5 in | Wt 159.4 lb

## 2024-01-13 DIAGNOSIS — Z0000 Encounter for general adult medical examination without abnormal findings: Secondary | ICD-10-CM

## 2024-01-13 DIAGNOSIS — Z0100 Encounter for examination of eyes and vision without abnormal findings: Secondary | ICD-10-CM

## 2024-01-13 DIAGNOSIS — H9193 Unspecified hearing loss, bilateral: Secondary | ICD-10-CM | POA: Diagnosis not present

## 2024-01-13 NOTE — Patient Instructions (Signed)
 Barbara Gross , Thank you for taking time to come for your Medicare Wellness Visit. I appreciate your ongoing commitment to your health goals. Please review the following plan we discussed and let me know if I can assist you in the future.   Referrals/Orders/Follow-Ups/Clinician Recommendations: Aim for 30 minutes of exercise or brisk walking, 6-8 glasses of water, and 5 servings of fruits and vegetables each day. Referral to an Audiologist (examine hearing) and to an Ophthalmologist (routine eye exam).  This is a list of the screening recommended for you and due dates:  Health Maintenance  Topic Date Due   COVID-19 Vaccine (6 - 2024-25 season) 06/02/2023   Flu Shot  05/01/2024   Medicare Annual Wellness Visit  01/12/2025   Mammogram  04/22/2025   Colon Cancer Screening  08/31/2027   DTaP/Tdap/Td vaccine (4 - Td or Tdap) 04/27/2029   Pneumonia Vaccine  Completed   DEXA scan (bone density measurement)  Completed   Hepatitis C Screening  Completed   Zoster (Shingles) Vaccine  Completed   HPV Vaccine  Aged Out   Meningitis B Vaccine  Aged Out    Advanced directives: (In Chart) A copy of your advanced directives are scanned into your chart should your provider ever need it.  Next Medicare Annual Wellness Visit scheduled for next year: Yes

## 2024-01-13 NOTE — Progress Notes (Signed)
 Subjective:   Barbara Gross is a 68 y.o. who presents for a Medicare Wellness preventive visit.  Visit Complete: In person  Persons Participating in Visit: Patient.  AWV Questionnaire: No: Patient Medicare AWV questionnaire was not completed prior to this visit.  Cardiac Risk Factors include: advanced age (>66men, >68 women);hypertension;dyslipidemia     Objective:    Today's Vitals   01/13/24 1134  BP: 128/70  Pulse: 85  SpO2: 97%  Weight: 159 lb 6.4 oz (72.3 kg)  Height: 5' 3.5" (1.613 m)   Body mass index is 27.79 kg/m.     01/13/2024   11:29 AM 09/04/2022   10:29 AM 09/04/2022   10:19 AM 07/17/2022    7:56 AM 03/01/2020   11:05 AM 06/22/2016    8:18 AM 05/14/2016    8:30 AM  Advanced Directives  Does Patient Have a Medical Advance Directive? Yes No No No No No No  Type of Estate agent of [place];Living will        Does patient want to make changes to medical advance directive? No - Patient declined        Copy of Healthcare Power of Attorney in Chart? Yes - validated most recent copy scanned in chart (See row information)        Would patient like information on creating a medical advance directive?  Yes (MAU/Ambulatory/Procedural Areas - Information given) No - Patient declined No - Patient declined No - Patient declined No - patient declined information     Current Medications (verified) Outpatient Encounter Medications as of 01/13/2024  Medication Sig   amLODipine (NORVASC) 5 MG tablet Take 1 tablet (5 mg total) by mouth daily.   aspirin 81 MG EC tablet Take 81 mg by mouth daily.   citalopram (CELEXA) 10 MG tablet Take 1 tablet (10 mg total) by mouth daily.   gabapentin (NEURONTIN) 100 MG capsule TAKE 2 CAPSULES BY MOUTH TWICE A DAY   gabapentin (NEURONTIN) 300 MG capsule TAKE 1 CAPSULE BY MOUTH AT BEDTIME   losartan (COZAAR) 50 MG tablet Take 1 tablet (50 mg total) by mouth daily.   lovastatin (MEVACOR) 20 MG tablet Take 1 tablet (20  mg total) by mouth at bedtime.   [DISCONTINUED] meloxicam (MOBIC) 15 MG tablet Take by mouth.   No facility-administered encounter medications on file as of 01/13/2024.    Allergies (verified) Codeine, Effexor [venlafaxine], and Hydrocodone   History: Past Medical History:  Diagnosis Date   ABDOMINAL PAIN, LOWER 05/22/2010   Allergy    pollen    ANXIETY 12/24/2007   on meds   Arthritis    back , shoulder   BACTERIAL VAGINITIS 12/24/2007   BREAST MASS, LEFT 07/15/2008   DEPRESSION 12/24/2007   on meds   DIVERTICULITIS OF COLON 04/21/2010   History of migraine    none in years   HYPERLIPIDEMIA 05/31/2007   on meds   HYPERTENSION 05/31/2007   Hypertension    Lesion of bladder    MENOPAUSE, EARLY 01/04/2010   Microscopic hematuria 01/03/2009   PONV (postoperative nausea and vomiting)    Past Surgical History:  Procedure Laterality Date   ABDOMINAL HYSTERECTOMY  1990   COLONOSCOPY  2017   JMP-MAC-good prep-tics-int hems-HPP x 1/TA x 1   CYSTOSCOPY WITH BIOPSY N/A 03/01/2020   Procedure: CYSTOSCOPY WITH BIOPSY, FULGERATION BLADDER;  Surgeon: Noel Christmas, MD;  Location: [facility];  Service: Urology;  Laterality: N/A;   DILATION AND CURETTAGE OF UTERUS  fibroid   EXPLORATORY LAPAROTOMY     as a teenager- may have taken appendix- pt unsure   OOPHORECTOMY Bilateral 1990   POLYPECTOMY  2017   HPP x 1/ TA x 1   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   TONSILLECTOMY  as child   Family History  Problem Relation Age of Onset   Hypertension Mother    Ulcers Mother        bleeding   Hyperlipidemia Mother    Sudden death Mother        bleeding ulcer 1995   Diabetes Father    Hypertension Father    Hyperlipidemia Father    Colon polyps Father    Colon cancer Paternal Grandmother    Cancer Other        prostate   Melanoma Other    Colon cancer Other        dx'd in 80's   Stroke Other    Esophageal cancer Neg Hx    Rectal cancer Neg Hx     Stomach cancer Neg Hx    Social History   Socioeconomic History   Marital status: Divorced    Spouse name: Not on file   Number of children: Not on file   Years of education: Not on file   Highest education level: Not on file  Occupational History   Occupation: Engineer, 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  Tobacco Use   Smoking status: Former    Types: Cigarettes    Start date: 10/01/1978    Passive exposure: Past   Smokeless tobacco: Never   Tobacco comments:    quit in the 80's   Vaping Use   Vaping status: Never Used  Substance and Sexual Activity   Alcohol use: Not Currently    Alcohol/week: 0.0 - 14.0 standard drinks of alcohol    Comment: 2 glasses of wine a night   Drug use: No   Sexual activity: Not on file  Other Topics Concern   Not on file  Social History Narrative   Not on file   Social Drivers of Health   Financial Resource Strain: Low Risk  (01/13/2024)   Overall Financial Resource Strain (CARDIA)    Difficulty of Paying Living Expenses: Not hard at all  Food Insecurity: No Food Insecurity (01/13/2024)   Hunger Vital Sign    Worried About Running Out of Food in the Last Year: Never true    Ran Out of Food in the Last Year: Never true  Transportation Needs: No Transportation Needs (01/13/2024)   PRAPARE - Administrator, Civil Service (Medical): No    Lack of Transportation (Non-Medical): No  Physical Activity: Insufficiently Active (01/13/2024)   Exercise Vital Sign    Days of Exercise per Week: 3 days    Minutes of Exercise per Session: 20 min  Stress: No Stress Concern Present (01/13/2024)   [employer] of Occupational Health - Occupational Stress Questionnaire    Feeling of Stress : Not at all  Social Connections: Socially Isolated (01/13/2024)   Social Connection and Isolation Panel [NHANES]    Frequency of Communication with Friends and Family: More than three times a week    Frequency of Social Gatherings with Friends and Family: Once a week     Attends Religious Services: Never    Database administrator or Organizations: No    Attends Banker Meetings: Never    Marital Status: Divorced    Tobacco Counseling Counseling given: No Tobacco comments:  quit in the 80's     Clinical Intake:  Pre-visit preparation completed: Yes  Pain : No/denies pain     BMI - recorded: 27.29 Nutritional Status: BMI 25 -29 Overweight Nutritional Risks: None Diabetes: No  Lab Results  Component Value Date   HGBA1C 5.7 05/09/2023   HGBA1C 5.9 05/02/2022   HGBA1C 5.7 04/28/2021     How often do you need to have someone help you when you read instructions, pamphlets, or other written materials from your doctor or pharmacy?: 1 - Never  Interpreter Needed?: No  Information entered by :: Hassell Halim, CMA   Activities of Daily Living     01/13/2024   11:44 AM  In your present state of health, do you have any difficulty performing the following activities:  Hearing? 0  Vision? 0  Difficulty concentrating or making decisions? 0  Walking or climbing stairs? 0  Dressing or bathing? 0  Doing errands, shopping? 0  Preparing Food and eating ? N  Using the Toilet? N  In the past six months, have you accidently leaked urine? N  Do you have problems with loss of bowel control? N  Managing your Medications? N  Managing your Finances? N  Housekeeping or managing your Housekeeping? N    Patient Care Team: Corwin Levins, MD as PCP - General  Indicate any recent Medical Services you may have received from other than [facility] providers in the past year (date may be approximate).     Assessment:   This is a routine wellness examination for Na.  Hearing/Vision screen Hearing Screening - Comments:: Concerns with hearing - referral made to Audiologist Vision Screening - Comments:: Wears eyeglasses for reading only - Referral to [facility]   Goals Addressed               This Visit's Progress      Patient Stated (pt-stated)        Patient stated that now that she's retired she wants to exercise more and eat healthier.       Depression Screen     01/13/2024   11:47 AM 05/14/2023    9:05 AM 09/04/2022   10:22 AM 05/23/2022    4:11 PM 05/08/2022    9:12 AM 07/26/2021    9:43 AM 04/28/2020    9:23 AM  PHQ 2/9 Scores  PHQ - 2 Score 0 0 0 0 0 0 0  PHQ- 9 Score 0   3 0      Fall Risk     01/13/2024   11:46 AM 05/14/2023    9:05 AM 09/04/2022   10:20 AM 05/23/2022    4:10 PM 05/08/2022    9:11 AM  Fall Risk   Falls in the past year? 0 0 1 1 1   Number falls in past yr: 0 0 0 1 1  Injury with Fall? 0 0 0 0 0  Risk for fall due to : No Fall Risks No Fall Risks No Fall Risks History of fall(s) History of fall(s)  Risk for fall due to: Comment    had a knee replacement   Follow up Falls prevention discussed;Falls evaluation completed Falls evaluation completed Falls prevention discussed Falls evaluation completed Falls evaluation completed    MEDICARE RISK AT HOME:  Medicare Risk at Home Any stairs in or around the home?: No If so, are there any without handrails?: No Home free of loose throw rugs in walkways, pet beds, electrical cords, etc?: Yes  Adequate lighting in your home to reduce risk of falls?: Yes Life alert?: No Use of a cane, walker or w/c?: No Grab bars in the bathroom?: Yes Shower chair or bench in shower?: Yes Elevated toilet seat or a handicapped toilet?: No  TIMED UP AND GO:  Was the test performed?  No  Cognitive Function: 6CIT completed        01/13/2024   11:51 AM 09/04/2022   10:27 AM  6CIT Screen  What Year? 0 points 0 points  What month? 0 points 0 points  What time? 0 points 0 points  Count back from 20 0 points 0 points  Months in reverse 0 points 0 points  Repeat phrase 0 points 0 points  Total Score 0 points 0 points    Immunizations Immunization History  Administered Date(s) Administered   Fluad Quad(high Dose 65+) 07/26/2021    Influenza Whole 09/03/2002   Influenza,inj,Quad PF,6+ Mos 07/16/2019   Influenza-Unspecified 07/21/2018, 05/12/2022   PFIZER Comirnaty(Gray Top)Covid-19 Tri-Sucrose Vaccine 01/09/2021   PFIZER(Purple Top)SARS-COV-2 Vaccination 12/24/2019, 01/21/2020, 08/18/2020   PNEUMOCOCCAL CONJUGATE-20 05/08/2022   Pfizer Covid-19 Vaccine Bivalent Booster 30yrs & up 05/12/2022   Td 10/02/1995, 01/03/2009   Tdap 04/28/2019   Zoster Recombinant(Shingrix) 08/28/2019, 10/05/2019    Screening Tests Health Maintenance  Topic Date Due   COVID-19 Vaccine (6 - 2024-25 season) 06/02/2023   INFLUENZA VACCINE  05/01/2024   Medicare Annual Wellness (AWV)  01/12/2025   MAMMOGRAM  04/22/2025   Colonoscopy  08/31/2027   DTaP/Tdap/Td (4 - Td or Tdap) 04/27/2029   Pneumonia Vaccine 10+ Years old  Completed   DEXA SCAN  Completed   Hepatitis C Screening  Completed   Zoster Vaccines- Shingrix  Completed   HPV VACCINES  Aged Out   Meningococcal B Vaccine  Aged Out    Health Maintenance  Health Maintenance Due  Topic Date Due   COVID-19 Vaccine (6 - 2024-25 season) 06/02/2023   Health Maintenance Items Addressed: Referral sent to Optometry/Ophthalmology and Referral to Audiologist (pt stated difficulty hearing)  Additional Screening:  Vision Screening: Recommended annual ophthalmology exams for early detection of glaucoma and other disorders of the eye.  Dental Screening: Recommended annual dental exams for proper oral hygiene  Community Resource Referral / Chronic Care Management: CRR required this visit?  No   CCM required this visit?  No     Plan:     I have personally reviewed and noted the following in the patient's chart:   Medical and social history Use of alcohol, tobacco or illicit drugs  Current medications and supplements including opioid prescriptions. Patient is not currently taking opioid prescriptions. Functional ability and status Nutritional status Physical  activity Advanced directives List of other physicians Hospitalizations, surgeries, and ER visits in previous 12 months Vitals Screenings to include cognitive, depression, and falls Referrals and appointments  In addition, I have reviewed and discussed with patient certain preventive protocols, quality metrics, and best practice recommendations. A written personalized care plan for preventive services as well as general preventive health recommendations were provided to patient.     Darreld Mclean, CMA   01/13/2024   After Visit Summary: (In Person-Declined) Patient declined AVS at this time.  Notes: Nothing significant to report at this time.

## 2024-01-23 DIAGNOSIS — H5203 Hypermetropia, bilateral: Secondary | ICD-10-CM | POA: Diagnosis not present

## 2024-02-06 ENCOUNTER — Ambulatory Visit: Attending: Internal Medicine | Admitting: Audiologist

## 2024-02-06 DIAGNOSIS — H903 Sensorineural hearing loss, bilateral: Secondary | ICD-10-CM | POA: Insufficient documentation

## 2024-02-06 NOTE — Procedures (Signed)
  Outpatient Audiology and [facility] [address] [place], [place]  [address] [phone]  AUDIOLOGICAL  EVALUATION  NAME: Barbara Gross     DOB:   April 28, 1956      MRN: [id number]                                                                                     DATE: 02/06/2024     REFERENT: Roslyn Coombe, MD STATUS: Outpatient DIAGNOSIS: Sensorineural Hearing Loss Bilateral    History: Barbara Gross was seen for an audiological evaluation due to difficulty hearing people clearly. She can tell she misses a lot. She is reading lips. She ends up pretending like she understands. Her father had hearing loss and hearing aids later in life.  Barbara Gross denies pain, pressure, or tinnitus.  Barbara Gross no significant history of hazardous noise exposure.  Medical history shows no additional risk for hearing loss.    Evaluation:  Otoscopy showed a clear view of the tympanic membranes, bilaterally Tympanometry results were consistent with normal middle ear function, bilaterally   Audiometric testing was completed using Conventional Audiometry techniques with insert earphones and supraural headphones. Test results are consistent with moderate sensorineural hearing loss 250-8kHz bilaterally. Speech Recognition Thresholds were obtained at 50 dB HL in the right ear and at 45 dB HL in the left ear. Word Recognition Testing was completed at  40dB SL and Barbara Gross scored 96% in the right ear and 100% in the left.    Results:  The test results were reviewed with Barbara Gross. She has a flat moderate sensorineural hearing loss bilaterally 250-8kHz. She needs hearing aids for both ears. She was counseled on the loss and need for aids.  Audiogram printed and provided to Barbara Gross.    Recommendations: Hearing aids recommended for both ears. Patient given list of local hearing aid providers.  Annual audiometric testing recommended to monitor hearing loss for progression.    32 minutes spent testing and  counseling on results.   If you have any questions please feel free to contact me at [phone].  Raynald Calkins Stalnaker Au.D.  Audiologist   02/06/2024  11:49 AM  Cc: Roslyn Coombe, MD

## 2024-02-27 ENCOUNTER — Encounter: Payer: Self-pay | Admitting: Internal Medicine

## 2024-02-27 ENCOUNTER — Other Ambulatory Visit: Payer: Self-pay | Admitting: Internal Medicine

## 2024-02-28 ENCOUNTER — Other Ambulatory Visit: Payer: Self-pay

## 2024-02-28 MED ORDER — CITALOPRAM HYDROBROMIDE 10 MG PO TABS: 10.0000 mg | ORAL_TABLET | Freq: Every day | ORAL | 3 refills | Status: DC

## 2024-02-28 NOTE — Telephone Encounter (Signed)
 A year supply has been re-sent.

## 2024-02-28 NOTE — Telephone Encounter (Signed)
 Patient's pharmacy said they have no refills left. Can this be re-sent to the pharmacy? Best callback is [phone].

## 2024-04-21 ENCOUNTER — Encounter: Payer: Self-pay | Admitting: Internal Medicine

## 2024-04-21 DIAGNOSIS — M25561 Pain in right knee: Secondary | ICD-10-CM

## 2024-04-22 NOTE — Progress Notes (Unsigned)
 [facility] [address] [place] [address] Phone: [phone] Subjective:   Barbara Gross, am serving as a scribe for Dr. Arthea Claudene.  I'm seeing this patient by the request  of:  Norleen Lynwood ORN, MD  CC: left knee pain   YEP:Dlagzrupcz  07/04/2021 Patient has had left knee pain for quite some time. 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 Enlargement of the Baker's cyst. Concern for a possible deep meniscal tear causing more instability. We will need to get MRI to further evaluate and patient would be a candidate for surgical intervention with patient already failing all conservative therapies including medications, steroid injections, viscosupplementation and physical therapy.   Updated 04/23/2024 Barbara Gross is a 68 y.o. female coming in with complaint of L knee pain. [place]ell at park 2 weeks ago. Feels weird, but had replacement 3 years ago. Just wanted to check to make sure her knee was okay. Swelling is going down.       Past Medical History:  Diagnosis Date   ABDOMINAL PAIN, LOWER 05/22/2010   Allergy    pollen    ANXIETY 12/24/2007   on meds   Arthritis    back , shoulder   BACTERIAL VAGINITIS 12/24/2007   BREAST MASS, LEFT 07/15/2008   DEPRESSION 12/24/2007   on meds   DIVERTICULITIS OF COLON 04/21/2010   History of migraine    none in years   HYPERLIPIDEMIA 05/31/2007   on meds   HYPERTENSION 05/31/2007   Hypertension    Lesion of bladder    MENOPAUSE, EARLY 01/04/2010   Microscopic hematuria 01/03/2009   PONV (postoperative nausea and vomiting)    Past Surgical History:  Procedure Laterality Date   ABDOMINAL HYSTERECTOMY  1990   COLONOSCOPY  2017   JMP-MAC-good prep-tics-int hems-HPP x 1/TA x 1   CYSTOSCOPY WITH BIOPSY N/A 03/01/2020   Procedure: CYSTOSCOPY WITH BIOPSY, FULGERATION BLADDER;   Surgeon: Elisabeth Valli BIRCH, MD;  Location: [facility];  Service: Urology;  Laterality: N/A;   DILATION AND CURETTAGE OF UTERUS     fibroid   EXPLORATORY LAPAROTOMY     as a teenager- may have taken appendix- pt unsure   OOPHORECTOMY Bilateral 1990   POLYPECTOMY  2017   HPP x 1/ TA x 1   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   TONSILLECTOMY  as child   Social History   Socioeconomic History   Marital status: Divorced    Spouse name: Not on file   Number of children: Not on file   Years of education: Not on file   Highest education level: Not on file  Occupational History   Occupation: Engineer, 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  Tobacco Use   Smoking status: Former    Types: Cigarettes    Start date: 10/01/1978    Passive exposure: Past   Smokeless tobacco: Never   Tobacco comments:    quit in the 80's   Vaping Use   Vaping status: Never Used  Substance and Sexual Activity   Alcohol use: Not Currently    Alcohol/week: 0.0 - 14.0 standard drinks of alcohol    Comment: 2 glasses of wine a night   Drug use: No   Sexual activity: Not on file  Other Topics Concern   Not on file  Social History Narrative   Not on file   Social Drivers  of Health   Financial Resource Strain: Low Risk  (01/13/2024)   Overall Financial Resource Strain (CARDIA)    Difficulty of Paying Living Expenses: Not hard at all  Food Insecurity: No Food Insecurity (01/13/2024)   Hunger Vital Sign    Worried About Running Out of Food in the Last Year: Never true    Ran Out of Food in the Last Year: Never true  Transportation Needs: No Transportation Needs (01/13/2024)   PRAPARE - Administrator, Civil Service (Medical): No    Lack of Transportation (Non-Medical): No  Physical Activity: Insufficiently Active (01/13/2024)   Exercise Vital Sign    Days of Exercise per Week: 3 days    Minutes of Exercise per Session: 20 min  Stress: No Stress Concern Present (01/13/2024)   [employer]  of Occupational Health - Occupational Stress Questionnaire    Feeling of Stress : Not at all  Social Connections: Socially Isolated (01/13/2024)   Social Connection and Isolation Panel    Frequency of Communication with Friends and Family: More than three times a week    Frequency of Social Gatherings with Friends and Family: Once a week    Attends Religious Services: Never    Database administrator or Organizations: No    Attends Engineer, structural: Never    Marital Status: Divorced   Allergies  Allergen Reactions   Codeine Nausea And Vomiting   Effexor [Venlafaxine] Nausea Only   Hydrocodone Nausea And Vomiting   Family History  Problem Relation Age of Onset   Hypertension Mother    Ulcers Mother        bleeding   Hyperlipidemia Mother    Sudden death Mother        bleeding ulcer 1995   Diabetes Father    Hypertension Father    Hyperlipidemia Father    Colon polyps Father    Colon cancer Paternal Grandmother    Cancer Other        prostate   Melanoma Other    Colon cancer Other        dx'd in 80's   Stroke Other    Esophageal cancer Neg Hx    Rectal cancer Neg Hx    Stomach cancer Neg Hx      Current Outpatient Medications (Cardiovascular):    amLODipine  (NORVASC ) 5 MG tablet, Take 1 tablet (5 mg total) by mouth daily.   losartan  (COZAAR ) 50 MG tablet, Take 1 tablet (50 mg total) by mouth daily.   lovastatin  (MEVACOR ) 20 MG tablet, Take 1 tablet (20 mg total) by mouth at bedtime.   Current Outpatient Medications (Analgesics):    aspirin 81 MG EC tablet, Take 81 mg by mouth daily.   Current Outpatient Medications (Other):    citalopram  (CELEXA ) 10 MG tablet, Take 1 tablet (10 mg total) by mouth daily.   gabapentin  (NEURONTIN ) 100 MG capsule, TAKE 2 CAPSULES BY MOUTH TWICE A DAY   gabapentin  (NEURONTIN ) 300 MG capsule, TAKE 1 CAPSULE BY MOUTH AT BEDTIME   Reviewed prior external information including notes and imaging from  primary care  provider As well as notes that were available from care everywhere and other healthcare systems.  Past medical history, social, surgical and family history all reviewed in electronic medical record.  No pertanent information unless stated regarding to the chief complaint.   Review of Systems:  No headache, visual changes, nausea, vomiting, diarrhea, constipation, dizziness, abdominal pain, skin rash, fevers, chills, night sweats, weight  loss, swollen lymph nodes, body aches, joint swelling, chest pain, shortness of breath, mood changes. POSITIVE muscle aches  Objective  There were no vitals taken for this visit.   General: No apparent distress alert and oriented x3 mood and affect normal, dressed appropriately.  HEENT: Pupils equal, extraocular movements intact  Respiratory: Patient's speak in full sentences and does not appear short of breath  Cardiovascular: No lower extremity edema, non tender, no erythema antalgic gait noted Knee exam shows left knee does have a hematoma noted over the anterior aspect just inferior to the patella seems to cross the patella tendon.  Seems to be superficial.  Tender.  No erythema.  Abrasion over right seems to be healing.  Does have some bruising on the medial aspect of the knee.  No significant instability of the replacement noted.  Does have limited flexion secondary to pain on the anterior aspect of the knee  Limited muscular skeletal ultrasound was performed and interpreted by CLAUDENE HUSSAR, M  Limited ultrasound does show fluid accumulation that is superficial consistent with a hematoma.  Does not seem to involve the patellar tendon.  Does not seem to involve any of the patella itself.  No effusion of the knee replacement   Procedure: Real-time Ultrasound Guided Injection of left prepatellar bursa Device: GE Logiq Q7 Ultrasound guided injection is preferred based studies that show increased duration, increased effect, greater accuracy, decreased  procedural pain, increased response rate, and decreased cost with ultrasound guided versus blind injection.  Verbal informed consent obtained.  Time-out conducted.  Noted no overlying erythema, induration, or other signs of local infection.  Skin prepped in a sterile fashion.  Local anesthesia: Topical Ethyl chloride.  With sterile technique and under real time ultrasound guidance: With an 18-gauge 1 inch needle injected with 0.5 cc of 0.5% Marcaine  and then had aspiration of 5 cc of frank blood that then coagulated.  After needle removed to continue to have expression of the venous blood.  Used gauze.  Band-Aid placed.  Postinjection instructions given. Completed without difficulty  Pain immediately resolved suggesting accurate placement of the medication.  Advised to call if fevers/chills, erythema, induration, drainage, or persistent bleeding.  Impression: Technically successful ultrasound guided injection.    Impression and Recommendations:    The above documentation has been reviewed and is accurate and complete Xavyer Steenson M Jahmai Finelli, DO

## 2024-04-23 ENCOUNTER — Ambulatory Visit: Admitting: Family Medicine

## 2024-04-23 ENCOUNTER — Encounter: Payer: Self-pay | Admitting: Family Medicine

## 2024-04-23 ENCOUNTER — Other Ambulatory Visit: Payer: Self-pay

## 2024-04-23 ENCOUNTER — Ambulatory Visit (INDEPENDENT_AMBULATORY_CARE_PROVIDER_SITE_OTHER)

## 2024-04-23 VITALS — BP 122/84 | HR 89 | Ht 63.0 in | Wt 155.0 lb

## 2024-04-23 DIAGNOSIS — M25562 Pain in left knee: Secondary | ICD-10-CM | POA: Diagnosis not present

## 2024-04-23 DIAGNOSIS — S8002XA Contusion of left knee, initial encounter: Secondary | ICD-10-CM | POA: Diagnosis not present

## 2024-04-23 DIAGNOSIS — Z96652 Presence of left artificial knee joint: Secondary | ICD-10-CM | POA: Diagnosis not present

## 2024-04-23 DIAGNOSIS — S8992XA Unspecified injury of left lower leg, initial encounter: Secondary | ICD-10-CM

## 2024-04-23 DIAGNOSIS — M799 Soft tissue disorder, unspecified: Secondary | ICD-10-CM | POA: Diagnosis not present

## 2024-04-23 MED ORDER — DOXYCYCLINE HYCLATE 100 MG PO TABS: 100.0000 mg | ORAL_TABLET | Freq: Two times a day (BID) | ORAL | 0 refills | Status: DC

## 2024-04-23 NOTE — Patient Instructions (Addendum)
 Drained knee today Good to see you! Doxycycline  prescribed Arnica lotion  Ice today, heat tomorrow See you again on Aug 4th

## 2024-04-23 NOTE — Assessment & Plan Note (Addendum)
 Patient's x-rays seem to be relatively normal but had the hematoma still noted on the anterior aspect of the knee.  Patient had improvement in range of motion immediately after the aspiration.  We discussed that there is a chance for fluid accumulation again.  Likely the replacement is stable at this time. Follow-up again 2 weeks doxycycline  given to avoid any type of infectious etiology

## 2024-04-29 NOTE — Progress Notes (Signed)
 [facility] [address] [place] [address] Phone: [phone] Subjective:   Barbara Gross, am serving as a scribe for Dr. Arthea Claudene.  I'm seeing this patient by the request  of:  Norleen Lynwood ORN, MD  CC: For left knee pain  YEP:Dlagzrupcz  04/23/2024 Patient's x-rays seem to be relatively normal but had the hematoma still noted on the anterior aspect of the knee.  Patient had improvement in range of motion immediately after the aspiration.  We discussed that there is a chance for fluid accumulation again.  Likely the replacement is stable at this time. Follow-up again 2 weeks doxycycline  given to avoid any type of infectious etiology      Update 05/04/2024 Barbara Gross is a 68 y.o. female coming in with complaint of L knee pain. Patient states that she has shooting pain the joint from time to time. Chronic swelling present. Aches when she stands for prolonged periods. Slowly improving.       Past Medical History:  Diagnosis Date   ABDOMINAL PAIN, LOWER 05/22/2010   Allergy    pollen    ANXIETY 12/24/2007   on meds   Arthritis    back , shoulder   BACTERIAL VAGINITIS 12/24/2007   BREAST MASS, LEFT 07/15/2008   DEPRESSION 12/24/2007   on meds   DIVERTICULITIS OF COLON 04/21/2010   History of migraine    none in years   HYPERLIPIDEMIA 05/31/2007   on meds   HYPERTENSION 05/31/2007   Hypertension    Lesion of bladder    MENOPAUSE, EARLY 01/04/2010   Microscopic hematuria 01/03/2009   PONV (postoperative nausea and vomiting)    Past Surgical History:  Procedure Laterality Date   ABDOMINAL HYSTERECTOMY  1990   COLONOSCOPY  2017   JMP-MAC-good prep-tics-int hems-HPP x 1/TA x 1   CYSTOSCOPY WITH BIOPSY N/A 03/01/2020   Procedure: CYSTOSCOPY WITH BIOPSY, FULGERATION BLADDER;  Surgeon: Elisabeth Valli BIRCH, MD;  Location: [facility];  Service: Urology;  Laterality: N/A;   DILATION AND CURETTAGE OF UTERUS      fibroid   EXPLORATORY LAPAROTOMY     as a teenager- may have taken appendix- pt unsure   OOPHORECTOMY Bilateral 1990   POLYPECTOMY  2017   HPP x 1/ TA x 1   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   TONSILLECTOMY  as child   Social History   Socioeconomic History   Marital status: Divorced    Spouse name: Not on file   Number of children: Not on file   Years of education: Not on file   Highest education level: Not on file  Occupational History   Occupation: Engineer, 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  Tobacco Use   Smoking status: Former    Types: Cigarettes    Start date: 10/01/1978    Passive exposure: Past   Smokeless tobacco: Never   Tobacco comments:    quit in the 80's   Vaping Use   Vaping status: Never Used  Substance and Sexual Activity   Alcohol use: Not Currently    Alcohol/week: 0.0 - 14.0 standard drinks of alcohol    Comment: 2 glasses of wine a night   Drug use: No   Sexual activity: Not on file  Other Topics Concern   Not on file  Social History Narrative   Not on file   Social Drivers of Health   Financial Resource Strain: Low Risk  (01/13/2024)   Overall Financial Resource  Strain (CARDIA)    Difficulty of Paying Living Expenses: Not hard at all  Food Insecurity: No Food Insecurity (01/13/2024)   Hunger Vital Sign    Worried About Running Out of Food in the Last Year: Never true    Ran Out of Food in the Last Year: Never true  Transportation Needs: No Transportation Needs (01/13/2024)   PRAPARE - Administrator, Civil Service (Medical): No    Lack of Transportation (Non-Medical): No  Physical Activity: Insufficiently Active (01/13/2024)   Exercise Vital Sign    Days of Exercise per Week: 3 days    Minutes of Exercise per Session: 20 min  Stress: No Stress Concern Present (01/13/2024)   [employer] of Occupational Health - Occupational Stress Questionnaire    Feeling of Stress : Not at all  Social Connections: Socially Isolated (01/13/2024)    Social Connection and Isolation Panel    Frequency of Communication with Friends and Family: More than three times a week    Frequency of Social Gatherings with Friends and Family: Once a week    Attends Religious Services: Never    Database administrator or Organizations: No    Attends Engineer, structural: Never    Marital Status: Divorced   Allergies  Allergen Reactions   Codeine Nausea And Vomiting   Effexor [Venlafaxine] Nausea Only   Hydrocodone Nausea And Vomiting   Family History  Problem Relation Age of Onset   Hypertension Mother    Ulcers Mother        bleeding   Hyperlipidemia Mother    Sudden death Mother        bleeding ulcer 1995   Diabetes Father    Hypertension Father    Hyperlipidemia Father    Colon polyps Father    Colon cancer Paternal Grandmother    Cancer Other        prostate   Melanoma Other    Colon cancer Other        dx'd in 80's   Stroke Other    Esophageal cancer Neg Hx    Rectal cancer Neg Hx    Stomach cancer Neg Hx      Current Outpatient Medications (Cardiovascular):    amLODipine  (NORVASC ) 5 MG tablet, Take 1 tablet (5 mg total) by mouth daily.   losartan  (COZAAR ) 50 MG tablet, Take 1 tablet (50 mg total) by mouth daily.   lovastatin  (MEVACOR ) 20 MG tablet, Take 1 tablet (20 mg total) by mouth at bedtime.   Current Outpatient Medications (Analgesics):    aspirin 81 MG EC tablet, Take 81 mg by mouth daily.   Current Outpatient Medications (Other):    citalopram  (CELEXA ) 10 MG tablet, Take 1 tablet (10 mg total) by mouth daily.   doxycycline  (VIBRA -TABS) 100 MG tablet, Take 1 tablet (100 mg total) by mouth 2 (two) times daily.   gabapentin  (NEURONTIN ) 100 MG capsule, TAKE 2 CAPSULES BY MOUTH TWICE A DAY   gabapentin  (NEURONTIN ) 300 MG capsule, TAKE 1 CAPSULE BY MOUTH AT BEDTIME   Reviewed prior external information including notes and imaging from  primary care provider As well as notes that were available from care  everywhere and other healthcare systems.  Past medical history, social, surgical and family history all reviewed in electronic medical record.  No pertanent information unless stated regarding to the chief complaint.   Review of Systems:  No headache, visual changes, nausea, vomiting, diarrhea, constipation, dizziness, abdominal pain, skin rash, fevers, chills,  night sweats, weight loss, swollen lymph nodes, body aches, joint swelling, chest pain, shortness of breath, mood changes. POSITIVE muscle aches  Objective  Blood pressure 118/82, pulse 82, height 5' 3 (1.6 m), weight 157 lb (71.2 kg), SpO2 97%.   General: No apparent distress alert and oriented x3 mood and affect normal, dressed appropriately.  HEENT: Pupils equal, extraocular movements intact  Respiratory: Patient's speak in full sentences and does not appear short of breath  Cardiovascular: No lower extremity edema, non tender, no erythema  Left knee replacement noted.  Patient does have improvement in range of motion.  Significant decrease in the swelling noted over the prepatellar area.  Minimal warmness to touch.  Improvement in range of motion of the knee noted.  Still has some accumulation noted.    Impression and Recommendations:    The above documentation has been reviewed and is accurate and complete Rishith Siddoway M Pardeep Pautz, DO

## 2024-05-04 ENCOUNTER — Ambulatory Visit: Admitting: Family Medicine

## 2024-05-04 ENCOUNTER — Encounter: Payer: Self-pay | Admitting: Family Medicine

## 2024-05-04 VITALS — BP 118/82 | HR 82 | Ht 63.0 in | Wt 157.0 lb

## 2024-05-04 DIAGNOSIS — S8002XA Contusion of left knee, initial encounter: Secondary | ICD-10-CM | POA: Diagnosis not present

## 2024-05-04 DIAGNOSIS — Z1231 Encounter for screening mammogram for malignant neoplasm of breast: Secondary | ICD-10-CM | POA: Diagnosis not present

## 2024-05-04 LAB — HM MAMMOGRAPHY

## 2024-05-04 NOTE — Assessment & Plan Note (Signed)
 Improving at this moment slow, but should continue, continue contrast therapy  Do feel replacement is stable at this time.  We discussed icing regimen only if needed.  Follow-up with me again in 2 months to make sure completely resolves.  Worsening pain seek medical attention

## 2024-05-04 NOTE — Patient Instructions (Signed)
 Ok to walk.  Ice and heat and massage.  Keep mashing it. See me again in 7 to 8 weeks.

## 2024-05-07 ENCOUNTER — Other Ambulatory Visit (INDEPENDENT_AMBULATORY_CARE_PROVIDER_SITE_OTHER)

## 2024-05-07 DIAGNOSIS — E7800 Pure hypercholesterolemia, unspecified: Secondary | ICD-10-CM

## 2024-05-07 DIAGNOSIS — E559 Vitamin D deficiency, unspecified: Secondary | ICD-10-CM

## 2024-05-07 DIAGNOSIS — E538 Deficiency of other specified B group vitamins: Secondary | ICD-10-CM

## 2024-05-07 DIAGNOSIS — R739 Hyperglycemia, unspecified: Secondary | ICD-10-CM | POA: Diagnosis not present

## 2024-05-07 LAB — BASIC METABOLIC PANEL WITH GFR
BUN: 12 mg/dL (ref 6–23)
CO2: 25 meq/L (ref 19–32)
Calcium: 9.7 mg/dL (ref 8.4–10.5)
Chloride: 104 meq/L (ref 96–112)
Creatinine, Ser: 0.64 mg/dL (ref 0.40–1.20)
GFR: 91.19 mL/min (ref >60.00)
Glucose, Bld: 96 mg/dL (ref 70–99)
Potassium: 4.2 meq/L (ref 3.5–5.1)
Sodium: 142 meq/L (ref 135–145)

## 2024-05-07 LAB — URINALYSIS, ROUTINE W REFLEX MICROSCOPIC
Bilirubin Urine: NEGATIVE
Ketones, ur: NEGATIVE
Leukocytes,Ua: NEGATIVE
Nitrite: NEGATIVE
Specific Gravity, Urine: 1.015 (ref 1.000–1.030)
Total Protein, Urine: NEGATIVE
Urine Glucose: NEGATIVE
Urobilinogen, UA: 0.2 (ref 0.0–1.0)
WBC, UA: NONE SEEN (ref 0–?)
pH: 6.5 (ref 5.0–8.0)

## 2024-05-07 LAB — CBC WITH DIFFERENTIAL/PLATELET
Basophils Absolute: 0 K/uL (ref 0.0–0.1)
Basophils Relative: 0.3 % (ref 0.0–3.0)
Eosinophils Absolute: 0.3 K/uL (ref 0.0–0.7)
Eosinophils Relative: 3.4 % (ref 0.0–5.0)
HCT: 42.7 % (ref 36.0–46.0)
Hemoglobin: 14 g/dL (ref 12.0–15.0)
Lymphocytes Relative: 33.9 % (ref 12.0–46.0)
Lymphs Abs: 2.7 K/uL (ref 0.7–4.0)
MCHC: 32.9 g/dL (ref 30.0–36.0)
MCV: 94.7 fl (ref 78.0–100.0)
Monocytes Absolute: 0.6 K/uL (ref 0.1–1.0)
Monocytes Relative: 8.2 % (ref 3.0–12.0)
Neutro Abs: 4.2 K/uL (ref 1.4–7.7)
Neutrophils Relative %: 54.2 % (ref 43.0–77.0)
Platelets: 340 K/uL (ref 150.0–400.0)
RBC: 4.51 Mil/uL (ref 3.87–5.11)
RDW: 13.9 % (ref 11.5–15.5)
WBC: 7.8 K/uL (ref 4.0–10.5)

## 2024-05-07 LAB — LIPID PANEL
Cholesterol: 137 mg/dL (ref 0–200)
HDL: 55.1 mg/dL (ref >39.00)
LDL Cholesterol: 56 mg/dL (ref 0–99)
NonHDL: 82.33
Total CHOL/HDL Ratio: 2
Triglycerides: 131 mg/dL (ref 0.0–149.0)
VLDL: 26.2 mg/dL (ref 0.0–40.0)

## 2024-05-07 LAB — VITAMIN B12: Vitamin B-12: 752 pg/mL (ref 211–911)

## 2024-05-07 LAB — VITAMIN D 25 HYDROXY (VIT D DEFICIENCY, FRACTURES): VITD: 56.63 ng/mL (ref 30.00–100.00)

## 2024-05-07 LAB — HEMOGLOBIN A1C: Hgb A1c MFr Bld: 6 % (ref 4.6–6.5)

## 2024-05-07 LAB — TSH: TSH: 1.24 u[IU]/mL (ref 0.35–5.50)

## 2024-05-08 LAB — HEPATIC FUNCTION PANEL
ALT: 16 U/L (ref 0–35)
AST: 18 U/L (ref 0–37)
Albumin: 4.2 g/dL (ref 3.5–5.2)
Alkaline Phosphatase: 62 U/L (ref 39–117)
Bilirubin, Direct: 0.1 mg/dL (ref 0.0–0.3)
Total Bilirubin: 0.4 mg/dL (ref 0.2–1.2)
Total Protein: 6.9 g/dL (ref 6.0–8.3)

## 2024-05-09 ENCOUNTER — Other Ambulatory Visit: Payer: Self-pay | Admitting: Internal Medicine

## 2024-05-13 ENCOUNTER — Encounter: Payer: Self-pay | Admitting: Internal Medicine

## 2024-05-13 ENCOUNTER — Ambulatory Visit: Payer: Medicare Other | Admitting: Internal Medicine

## 2024-05-13 VITALS — BP 142/80 | HR 78 | Temp 97.9°F | Ht 63.0 in | Wt 156.0 lb

## 2024-05-13 DIAGNOSIS — E559 Vitamin D deficiency, unspecified: Secondary | ICD-10-CM | POA: Diagnosis not present

## 2024-05-13 DIAGNOSIS — R739 Hyperglycemia, unspecified: Secondary | ICD-10-CM | POA: Diagnosis not present

## 2024-05-13 DIAGNOSIS — Z0001 Encounter for general adult medical examination with abnormal findings: Secondary | ICD-10-CM

## 2024-05-13 DIAGNOSIS — I10 Essential (primary) hypertension: Secondary | ICD-10-CM

## 2024-05-13 DIAGNOSIS — E7800 Pure hypercholesterolemia, unspecified: Secondary | ICD-10-CM

## 2024-05-13 DIAGNOSIS — H9193 Unspecified hearing loss, bilateral: Secondary | ICD-10-CM | POA: Diagnosis not present

## 2024-05-13 DIAGNOSIS — E538 Deficiency of other specified B group vitamins: Secondary | ICD-10-CM | POA: Diagnosis not present

## 2024-05-13 MED ORDER — AMLODIPINE BESYLATE 5 MG PO TABS: 5.0000 mg | ORAL_TABLET | Freq: Every day | ORAL | 3 refills | Status: AC

## 2024-05-13 MED ORDER — LOVASTATIN 20 MG PO TABS: 20.0000 mg | ORAL_TABLET | Freq: Every day | ORAL | 3 refills | Status: AC

## 2024-05-13 MED ORDER — GABAPENTIN 300 MG PO CAPS: 300.0000 mg | ORAL_CAPSULE | Freq: Every day | ORAL | 1 refills | Status: AC

## 2024-05-13 MED ORDER — GABAPENTIN 100 MG PO CAPS: 200.0000 mg | ORAL_CAPSULE | Freq: Two times a day (BID) | ORAL | 1 refills | Status: AC

## 2024-05-13 MED ORDER — LOSARTAN POTASSIUM 50 MG PO TABS: 50.0000 mg | ORAL_TABLET | Freq: Every day | ORAL | 3 refills | Status: DC

## 2024-05-13 MED ORDER — CITALOPRAM HYDROBROMIDE 10 MG PO TABS: 10.0000 mg | ORAL_TABLET | Freq: Every day | ORAL | 3 refills | Status: AC

## 2024-05-13 NOTE — Assessment & Plan Note (Signed)
 Age and sex appropriate education and counseling updated with regular exercise and diet Referrals for preventative services - none needed Immunizations addressed - for flu shot at pharmacy Smoking counseling  - none needed Evidence for depression or other mood disorder - none significant - depression stable Most recent labs reviewed. I have personally reviewed and have noted: 1) the patient's medical and social history 2) The patient's current medications and supplements 3) The patient's height, weight, and BMI have been recorded in the chart

## 2024-05-13 NOTE — Progress Notes (Signed)
 Patient ID: Barbara Gross, female   DOB: 02-26-1956, 68 y.o.   MRN: [id number]         Chief Complaint:: wellness exam and bilateral hearing loss, low vit d, hyperglycemia, hld, htn       HPI:  Barbara Gross is a 68 y.o. female here for wellness exam; for flu shot at pharmacy, o/w up to date                        Also lost 15 lbs in her new retirement intentionally with less stress.  Pt denies chest pain, increased sob or doe, wheezing, orthopnea, PND, increased LE swelling, palpitations, dizziness or syncope.   Pt denies polydipsia, polyuria, or new focal neuro s/s.    Pt denies fever, wt loss, night sweats, loss of appetite, or other constitutional symptoms  Does have mild worsening hearing bilateral, but wary of expensive hearing aids as recommended at her last evaluation. Denies worsening depressive symptoms, suicidal ideation, or panic; Lives alone, no children or husband, worries about future as she ages and taking care of herself.    Wt Readings from Last 3 Encounters:  05/13/24 156 lb (70.8 kg)  05/04/24 157 lb (71.2 kg)  04/23/24 155 lb (70.3 kg)   BP Readings from Last 3 Encounters:  05/13/24 (!) 142/80  05/04/24 118/82  04/23/24 122/84   Immunization History  Administered Date(s) Administered   Fluad Quad(high Dose 65+) 07/26/2021   Influenza Whole 09/03/2002   Influenza,inj,Quad PF,6+ Mos 07/16/2019   Influenza-Unspecified 07/21/2018, 05/12/2022   PFIZER Comirnaty(Gray Top)Covid-19 Tri-Sucrose Vaccine 01/09/2021   PFIZER(Purple Top)SARS-COV-2 Vaccination 12/24/2019, 01/21/2020, 08/18/2020   PNEUMOCOCCAL CONJUGATE-20 05/08/2022   Pfizer Covid-19 Vaccine Bivalent Booster 36yrs & up 05/12/2022   Td 10/02/1995, 01/03/2009   Tdap 04/28/2019   Zoster Recombinant(Shingrix) 08/28/2019, 10/05/2019   Health Maintenance Due  Topic Date Due   INFLUENZA VACCINE  05/01/2024      Past Medical History:  Diagnosis Date   ABDOMINAL PAIN, LOWER 05/22/2010   Allergy     pollen    ANXIETY 12/24/2007   on meds   Arthritis    back , shoulder   BACTERIAL VAGINITIS 12/24/2007   BREAST MASS, LEFT 07/15/2008   DEPRESSION 12/24/2007   on meds   DIVERTICULITIS OF COLON 04/21/2010   History of migraine    none in years   HYPERLIPIDEMIA 05/31/2007   on meds   HYPERTENSION 05/31/2007   Hypertension    Lesion of bladder    MENOPAUSE, EARLY 01/04/2010   Microscopic hematuria 01/03/2009   PONV (postoperative nausea and vomiting)    Past Surgical History:  Procedure Laterality Date   ABDOMINAL HYSTERECTOMY  1990   COLONOSCOPY  2017   JMP-MAC-good prep-tics-int hems-HPP x 1/TA x 1   CYSTOSCOPY WITH BIOPSY N/A 03/01/2020   Procedure: CYSTOSCOPY WITH BIOPSY, FULGERATION BLADDER;  Surgeon: Elisabeth Valli BIRCH, MD;  Location: [facility];  Service: Urology;  Laterality: N/A;   DILATION AND CURETTAGE OF UTERUS     fibroid   EXPLORATORY LAPAROTOMY     as a teenager- may have taken appendix- pt unsure   OOPHORECTOMY Bilateral 1990   POLYPECTOMY  2017   HPP x 1/ TA x 1   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 Her smoking use included cigarettes. She started smoking about 45 years ago. She has been exposed to tobacco smoke.  She has never used smokeless tobacco. She reports that she does not currently use alcohol. She reports that she does not use drugs. family history includes Cancer in an other family member; Colon cancer in her paternal grandmother and another family member; Colon polyps in her father; Diabetes in her father; Hyperlipidemia in her father and mother; Hypertension in her father and mother; Melanoma in an other family member; Stroke in an other family member; Sudden death in her mother; Ulcers in her mother. Allergies  Allergen Reactions   Codeine Nausea And Vomiting   Effexor [Venlafaxine] Nausea Only   Hydrocodone Nausea And Vomiting   Current Outpatient Medications on  File Prior to Visit  Medication Sig Dispense Refill   aspirin 81 MG EC tablet Take 81 mg by mouth daily.     No current facility-administered medications on file prior to visit.        ROS:  All others reviewed and negative.  Objective        PE:  BP (!) 142/80   Pulse 78   Temp 97.9 F (36.6 C)   Ht 5' 3 (1.6 m)   Wt 156 lb (70.8 kg)   SpO2 96%   BMI 27.63 kg/m                 Constitutional: Pt appears in NAD               HENT: Head: NCAT.                Right Ear: External ear normal.                 Left Ear: External ear normal.                Eyes: . Pupils are equal, round, and reactive to light. Conjunctivae and EOM are normal               Nose: without d/c or deformity               Neck: Neck supple. Gross normal ROM               Cardiovascular: Normal rate and regular rhythm.                 Pulmonary/Chest: Effort normal and breath sounds without rales or wheezing.                Abd:  Soft, NT, ND, + BS, no organomegaly               Neurological: Pt is alert. At baseline orientation, motor grossly intact               Skin: Skin is warm. No rashes, no other new lesions, LE edema - none               Psychiatric: Pt behavior is normal without agitation   Micro: none  Cardiac tracings I have personally interpreted today:  none  Pertinent Radiological findings (summarize): none   Lab Results  Component Value Date   WBC 7.8 05/07/2024   HGB 14.0 05/07/2024   HCT 42.7 05/07/2024   PLT 340.0 05/07/2024   GLUCOSE 96 05/07/2024   CHOL 137 05/07/2024   TRIG 131.0 05/07/2024   HDL 55.10 05/07/2024   LDLCALC 56 05/07/2024   ALT 16 05/07/2024   AST 18 05/07/2024   NA 142 05/07/2024   K 4.2 05/07/2024   CL 104 05/07/2024  CREATININE 0.64 05/07/2024   BUN 12 05/07/2024   CO2 25 05/07/2024   TSH 1.24 05/07/2024   HGBA1C 6.0 05/07/2024   Assessment/Plan:  Barbara Gross is a 68 y.o. White or Caucasian [1] female with  has a past medical history of  ABDOMINAL PAIN, LOWER (05/22/2010), Allergy, ANXIETY (12/24/2007), Arthritis, BACTERIAL VAGINITIS (12/24/2007), BREAST MASS, LEFT (07/15/2008), DEPRESSION (12/24/2007), DIVERTICULITIS OF COLON (04/21/2010), History of migraine, HYPERLIPIDEMIA (05/31/2007), HYPERTENSION (05/31/2007), Hypertension, Lesion of bladder, MENOPAUSE, EARLY (01/04/2010), Microscopic hematuria (01/03/2009), and PONV (postoperative nausea and vomiting).  Encounter for well adult exam with abnormal findings Age and sex appropriate education and counseling updated with regular exercise and diet Referrals for preventative services - none needed Immunizations addressed - for flu shot at pharmacy Smoking counseling  - none needed Evidence for depression or other mood disorder - none significant - depression stable Most recent labs reviewed. I have personally reviewed and have noted: 1) the patient's medical and social history 2) The patient's current medications and supplements 3) The patient's height, weight, and BMI have been recorded in the chart   Vitamin D  deficiency Last vitamin D  Lab Results  Component Value Date   VD25OH 56.63 05/07/2024   Stable, cont oral replacement   Hyperglycemia Lab Results  Component Value Date   HGBA1C 6.0 05/07/2024   Stable, pt to continue current medical treatment  - diet, wt control   HLD (hyperlipidemia) Lab Results  Component Value Date   LDLCALC 56 05/07/2024   Stable, pt to continue current statin lovastatin  20 mg qd   Essential hypertension BP Readings from Last 3 Encounters:  05/13/24 (!) 142/80  05/04/24 118/82  04/23/24 122/84   Uncontrolled but pt states controlled at home, pt to continue medical treatment norvasc  5 every day, lovastatin  50 mg every day - declines other change   Bilateral hearing loss With mild worsening recently, pt encouraged for otc hearing aids  Followup: Return in about 1 year (around 05/13/2025).  Lynwood Rush, MD 05/13/2024 1:11  PM [facility]

## 2024-05-13 NOTE — Assessment & Plan Note (Signed)
 Last vitamin D  Lab Results  Component Value Date   VD25OH 56.63 05/07/2024   Stable, cont oral replacement

## 2024-05-13 NOTE — Assessment & Plan Note (Signed)
 With mild worsening recently, pt encouraged for otc hearing aids

## 2024-05-13 NOTE — Assessment & Plan Note (Signed)
 BP Readings from Last 3 Encounters:  05/13/24 (!) 142/80  05/04/24 118/82  04/23/24 122/84   Uncontrolled but pt states controlled at home, pt to continue medical treatment norvasc  5 every day, lovastatin  50 mg every day - declines other change

## 2024-05-13 NOTE — Assessment & Plan Note (Signed)
 Lab Results  Component Value Date   LDLCALC 56 05/07/2024   Stable, pt to continue current statin lovastatin  20 mg qd

## 2024-05-13 NOTE — Patient Instructions (Signed)
 Please continue all other medications as before, and refills have been done as requested.  Please have the pharmacy call with any other refills you may need.  Please continue your efforts at being more active, low cholesterol diet, and weight control.  You are otherwise up to date with prevention measures today.  Please keep your appointments with your specialists as you may have planned  Your lab testing was excellent  Please consider OTC hearing aids  Please make an Appointment to return for your 1 year visit, or sooner if needed, with Lab testing by Appointment as well, to be done about 3-5 days before at the FIRST FLOOR Lab (so this is for TWO appointments - please see the scheduling desk as you leave)

## 2024-05-13 NOTE — Assessment & Plan Note (Signed)
 Lab Results  Component Value Date   HGBA1C 6.0 05/07/2024   Stable, pt to continue current medical treatment  - diet, wt control

## 2024-05-13 NOTE — Addendum Note (Signed)
 Addended by: NORLEEN LYNWOOD ORN on: 05/13/2024 01:12 PM   Modules accepted: Orders

## 2024-06-05 ENCOUNTER — Other Ambulatory Visit: Payer: Self-pay | Admitting: Internal Medicine

## 2024-06-19 NOTE — Progress Notes (Signed)
 [facility] [address] [place] [address] Phone: [phone] Subjective:   ISusannah Gross, am serving as a scribe for Dr. Arthea Claudene.  I'm seeing this patient by the request  of:  Norleen Lynwood ORN, MD  CC: Left knee pain  YEP:Dlagzrupcz  05/04/2024 Improving at this moment slow, but should continue, continue contrast therapy  Do feel replacement is stable at this time.  We discussed icing regimen only if needed.  Follow-up with me again in 2 months to make sure completely resolves.  Worsening pain seek medical attention      Update 06/22/2024 Barbara Gross is a 68 y.o. female coming in with complaint of L knee pain. Patient states Doing pretty well. Feels tight, but moving in the right direction. Management of arthritis, feels like she's getting weak. May have trigger finger.       Past Medical History:  Diagnosis Date   ABDOMINAL PAIN, LOWER 05/22/2010   Allergy    pollen    ANXIETY 12/24/2007   on meds   Arthritis    back , shoulder   BACTERIAL VAGINITIS 12/24/2007   BREAST MASS, LEFT 07/15/2008   DEPRESSION 12/24/2007   on meds   DIVERTICULITIS OF COLON 04/21/2010   History of migraine    none in years   HYPERLIPIDEMIA 05/31/2007   on meds   HYPERTENSION 05/31/2007   Hypertension    Lesion of bladder    MENOPAUSE, EARLY 01/04/2010   Microscopic hematuria 01/03/2009   PONV (postoperative nausea and vomiting)    Past Surgical History:  Procedure Laterality Date   ABDOMINAL HYSTERECTOMY  1990   COLONOSCOPY  2017   JMP-MAC-good prep-tics-int hems-HPP x 1/TA x 1   CYSTOSCOPY WITH BIOPSY N/A 03/01/2020   Procedure: CYSTOSCOPY WITH BIOPSY, FULGERATION BLADDER;  Surgeon: Elisabeth Valli BIRCH, MD;  Location: [facility];  Service: Urology;  Laterality: N/A;   DILATION AND CURETTAGE OF UTERUS     fibroid   EXPLORATORY LAPAROTOMY     as a teenager- may have taken appendix- pt unsure   OOPHORECTOMY Bilateral 1990    POLYPECTOMY  2017   HPP x 1/ TA x 1   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   TONSILLECTOMY  as child   Social History   Socioeconomic History   Marital status: Divorced    Spouse name: Not on file   Number of children: Not on file   Years of education: Not on file   Highest education level: Not on file  Occupational History   Occupation: Engineer, 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  Tobacco Use   Smoking status: Former    Types: Cigarettes    Start date: 10/01/1978    Passive exposure: Past   Smokeless tobacco: Never   Tobacco comments:    quit in the 80's   Vaping Use   Vaping status: Never Used  Substance and Sexual Activity   Alcohol use: Not Currently    Alcohol/week: 0.0 - 14.0 standard drinks of alcohol    Comment: 2 glasses of wine a night   Drug use: No   Sexual activity: Not on file  Other Topics Concern   Not on file  Social History Narrative   Not on file   Social Drivers of Health   Financial Resource Strain: Low Risk  (01/13/2024)   Overall Financial Resource Strain (CARDIA)    Difficulty of Paying Living Expenses: Not hard at all  Food Insecurity: No Food Insecurity (  01/13/2024)   Hunger Vital Sign    Worried About Running Out of Food in the Last Year: Never true    Ran Out of Food in the Last Year: Never true  Transportation Needs: No Transportation Needs (01/13/2024)   PRAPARE - Administrator, Civil Service (Medical): No    Lack of Transportation (Non-Medical): No  Physical Activity: Insufficiently Active (01/13/2024)   Exercise Vital Sign    Days of Exercise per Week: 3 days    Minutes of Exercise per Session: 20 min  Stress: No Stress Concern Present (01/13/2024)   [employer] of Occupational Health - Occupational Stress Questionnaire    Feeling of Stress : Not at all  Social Connections: Socially Isolated (01/13/2024)   Social Connection and Isolation Panel    Frequency of Communication with Friends and Family: More than three times a  week    Frequency of Social Gatherings with Friends and Family: Once a week    Attends Religious Services: Never    Database administrator or Organizations: No    Attends Engineer, structural: Never    Marital Status: Divorced   Allergies  Allergen Reactions   Codeine Nausea And Vomiting   Effexor [Venlafaxine] Nausea Only   Hydrocodone Nausea And Vomiting   Family History  Problem Relation Age of Onset   Hypertension Mother    Ulcers Mother        bleeding   Hyperlipidemia Mother    Sudden death Mother        bleeding ulcer 1995   Diabetes Father    Hypertension Father    Hyperlipidemia Father    Colon polyps Father    Colon cancer Paternal Grandmother    Cancer Other        prostate   Melanoma Other    Colon cancer Other        dx'd in 80's   Stroke Other    Esophageal cancer Neg Hx    Rectal cancer Neg Hx    Stomach cancer Neg Hx      Current Outpatient Medications (Cardiovascular):    amLODipine  (NORVASC ) 5 MG tablet, Take 1 tablet (5 mg total) by mouth daily.   losartan  (COZAAR ) 50 MG tablet, Take 1 tablet (50 mg total) by mouth daily.   lovastatin  (MEVACOR ) 20 MG tablet, Take 1 tablet (20 mg total) by mouth at bedtime.   Current Outpatient Medications (Analgesics):    aspirin 81 MG EC tablet, Take 81 mg by mouth daily.   Current Outpatient Medications (Other):    citalopram  (CELEXA ) 10 MG tablet, Take 1 tablet (10 mg total) by mouth daily.   gabapentin  (NEURONTIN ) 100 MG capsule, Take 2 capsules (200 mg total) by mouth 2 (two) times daily.   gabapentin  (NEURONTIN ) 300 MG capsule, Take 1 capsule (300 mg total) by mouth at bedtime.   Reviewed prior external information including notes and imaging from  primary care provider As well as notes that were available from care everywhere and other healthcare systems.  Past medical history, social, surgical and family history all reviewed in electronic medical record.  No pertanent information unless  stated regarding to the chief complaint.   Review of Systems:  No headache, visual changes, nausea, vomiting, diarrhea, constipation, dizziness, abdominal pain, skin rash, fevers, chills, night sweats, weight loss, swollen lymph nodes, body aches, joint swelling, chest pain, shortness of breath, mood changes. POSITIVE muscle aches  Objective  Blood pressure 118/86, pulse 86, height 5' 3 (  1.6 m), weight 157 lb (71.2 kg), SpO2 98%.   General: No apparent distress alert and oriented x3 mood and affect normal, dressed appropriately.  HEENT: Pupils equal, extraocular movements intact  Respiratory: Patient's speak in full sentences and does not appear short of breath  Cardiovascular: No lower extremity edema, non tender, no erythema  Left knee exam shows that there is some still mild hematoma and left anterior aspect of the knee replacement.  Improvement in range of motion noted.  No redness or swelling noted. Left hand shows the patient does have a trigger nodule noted at the A2 pulley.   Impression and Recommendations:    The above documentation has been reviewed and is accurate and complete Logyn Kendrick M Trudy Kory, DO

## 2024-06-22 ENCOUNTER — Ambulatory Visit: Admitting: Family Medicine

## 2024-06-22 ENCOUNTER — Encounter: Payer: Self-pay | Admitting: Family Medicine

## 2024-06-22 VITALS — BP 118/86 | HR 86 | Ht 63.0 in | Wt 157.0 lb

## 2024-06-22 DIAGNOSIS — S8002XA Contusion of left knee, initial encounter: Secondary | ICD-10-CM | POA: Diagnosis not present

## 2024-06-22 DIAGNOSIS — M65332 Trigger finger, left middle finger: Secondary | ICD-10-CM

## 2024-06-22 NOTE — Assessment & Plan Note (Addendum)
 New problem, brace  and massage given, no improvement will consider injection at follow-up

## 2024-06-22 NOTE — Patient Instructions (Signed)
 Good to see you. Frog Splint for the next 2 weeks. Heat and massage for 10 minutes. See me again in 2 to 3 months.

## 2024-06-22 NOTE — Assessment & Plan Note (Signed)
 Patient does have a traumatic hematoma noted of the left knee but it is improving.  We discussed icing regimen and home exercises, discussed which activities to do and which ones to avoid.  Increase activity slowly.  Discussed heat and massage could be beneficial at this point.  Want to avoid certain repetitive motions.  Increase activity slowly.  Follow-up again in 2 months to make sure completely resolved.

## 2024-07-07 ENCOUNTER — Other Ambulatory Visit: Payer: Self-pay

## 2024-07-07 ENCOUNTER — Other Ambulatory Visit: Payer: Self-pay | Admitting: Internal Medicine

## 2024-08-25 NOTE — Progress Notes (Signed)
 [facility] [address] [place] [address] Phone: [phone] Subjective:   LILLETTE [facility], am serving as a scribe for Dr. Arthea Claudene.  I'm seeing this patient by the request  of:  Norleen Lynwood ORN, MD  CC: Left knee pain follow-up  YEP:Dlagzrupcz  06/22/2024 New problem, brace  and massage given, no improvement will consider injection at follow-up     Patient does have a traumatic hematoma noted of the left knee but it is improving.  We discussed icing regimen and home exercises, discussed which activities to do and which ones to avoid.  Increase activity slowly.  Discussed heat and massage could be beneficial at this point.  Want to avoid certain repetitive motions.  Increase activity slowly.  Follow-up again in 2 months to make sure completely resolved.      Update 08/26/2024 Barbara Gross is a 68 y.o. female coming in with complaint of L knee and L middle finger pain. Patient states that her finger gets stuck from time to time.   Knee is doing ok. Not as painful.       Past Medical History:  Diagnosis Date   ABDOMINAL PAIN, LOWER 05/22/2010   Allergy    pollen    ANXIETY 12/24/2007   on meds   Arthritis    back , shoulder   BACTERIAL VAGINITIS 12/24/2007   BREAST MASS, LEFT 07/15/2008   DEPRESSION 12/24/2007   on meds   DIVERTICULITIS OF COLON 04/21/2010   History of migraine    none in years   HYPERLIPIDEMIA 05/31/2007   on meds   HYPERTENSION 05/31/2007   Hypertension    Lesion of bladder    MENOPAUSE, EARLY 01/04/2010   Microscopic hematuria 01/03/2009   PONV (postoperative nausea and vomiting)    Past Surgical History:  Procedure Laterality Date   ABDOMINAL HYSTERECTOMY  1990   COLONOSCOPY  2017   JMP-MAC-good prep-tics-int hems-HPP x 1/TA x 1   CYSTOSCOPY WITH BIOPSY N/A 03/01/2020   Procedure: CYSTOSCOPY WITH BIOPSY, FULGERATION BLADDER;  Surgeon: Elisabeth Valli BIRCH, MD;  Location: [facility];  Service: Urology;  Laterality: N/A;   DILATION AND CURETTAGE OF UTERUS     fibroid   EXPLORATORY LAPAROTOMY     as a teenager- may have taken appendix- pt unsure   OOPHORECTOMY Bilateral 1990   POLYPECTOMY  2017   HPP x 1/ TA x 1   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   TONSILLECTOMY  as child   Social History   Socioeconomic History   Marital status: Divorced    Spouse name: Not on file   Number of children: Not on file   Years of education: Not on file   Highest education level: Not on file  Occupational History   Occupation: engineer, 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  Tobacco Use   Smoking status: Former    Types: Cigarettes    Start date: 10/01/1978    Passive exposure: Past   Smokeless tobacco: Never   Tobacco comments:    quit in the 80's   Vaping Use   Vaping status: Never Used  Substance and Sexual Activity   Alcohol use: Not Currently    Alcohol/week: 0.0 - 14.0 standard drinks of alcohol    Comment: 2 glasses of wine a night   Drug use: No   Sexual activity: Not on file  Other Topics Concern   Not on file  Social History Narrative   Not on  file   Social Drivers of Health   Financial Resource Strain: Low Risk  (01/13/2024)   Overall Financial Resource Strain (CARDIA)    Difficulty of Paying Living Expenses: Not hard at all  Food Insecurity: No Food Insecurity (01/13/2024)   Hunger Vital Sign    Worried About Running Out of Food in the Last Year: Never true    Ran Out of Food in the Last Year: Never true  Transportation Needs: No Transportation Needs (01/13/2024)   PRAPARE - Administrator, Civil Service (Medical): No    Lack of Transportation (Non-Medical): No  Physical Activity: Insufficiently Active (01/13/2024)   Exercise Vital Sign    Days of Exercise per Week: 3 days    Minutes of Exercise per Session: 20 min  Stress: No Stress Concern Present (01/13/2024)   [employer] of Occupational Health - Occupational Stress Questionnaire     Feeling of Stress : Not at all  Social Connections: Socially Isolated (01/13/2024)   Social Connection and Isolation Panel    Frequency of Communication with Friends and Family: More than three times a week    Frequency of Social Gatherings with Friends and Family: Once a week    Attends Religious Services: Never    Database Administrator or Organizations: No    Attends Engineer, Structural: Never    Marital Status: Divorced   Allergies  Allergen Reactions   Codeine Nausea And Vomiting   Effexor [Venlafaxine] Nausea Only   Hydrocodone Nausea And Vomiting   Family History  Problem Relation Age of Onset   Hypertension Mother    Ulcers Mother        bleeding   Hyperlipidemia Mother    Sudden death Mother        bleeding ulcer 1995   Diabetes Father    Hypertension Father    Hyperlipidemia Father    Colon polyps Father    Colon cancer Paternal Grandmother    Cancer Other        prostate   Melanoma Other    Colon cancer Other        dx'd in 80's   Stroke Other    Esophageal cancer Neg Hx    Rectal cancer Neg Hx    Stomach cancer Neg Hx      Current Outpatient Medications (Cardiovascular):    amLODipine  (NORVASC ) 5 MG tablet, Take 1 tablet (5 mg total) by mouth daily.   losartan  (COZAAR ) 50 MG tablet, TAKE 1 TABLET BY MOUTH DAILY   lovastatin  (MEVACOR ) 20 MG tablet, Take 1 tablet (20 mg total) by mouth at bedtime.   Current Outpatient Medications (Analgesics):    aspirin 81 MG EC tablet, Take 81 mg by mouth daily.   Current Outpatient Medications (Other):    citalopram  (CELEXA ) 10 MG tablet, Take 1 tablet (10 mg total) by mouth daily.   gabapentin  (NEURONTIN ) 100 MG capsule, Take 2 capsules (200 mg total) by mouth 2 (two) times daily.   gabapentin  (NEURONTIN ) 300 MG capsule, Take 1 capsule (300 mg total) by mouth at bedtime.   Reviewed prior external information including notes and imaging from  primary care provider As well as notes that were available  from care everywhere and other healthcare systems.  Past medical history, social, surgical and family history all reviewed in electronic medical record.  No pertanent information unless stated regarding to the chief complaint.   Review of Systems:  No headache, visual changes, nausea, vomiting, diarrhea, constipation, dizziness,  abdominal pain, skin rash, fevers, chills, night sweats, weight loss, swollen lymph nodes, body aches, joint swelling, chest pain, shortness of breath, mood changes. POSITIVE muscle aches  Objective  Blood pressure 118/80, pulse 93, height 5' 3 (1.6 m), weight 153 lb (69.4 kg), SpO2 96%.   General: No apparent distress alert and oriented x3 mood and affect normal, dressed appropriately.  HEENT: Pupils equal, extraocular movements intact  Respiratory: Patient's speak in full sentences and does not appear short of breath  Cardiovascular: No lower extremity edema, non tender, no erythema  Left knee exam shows postsurgical changes noted.  He does have flexion to nearly 100 degrees at this time.  No audible popping noted.  Still fullness in the patellofemoral area.    Impression and Recommendations:    The above documentation has been reviewed and is accurate and complete Kemontae Dunklee M Solomia Harrell, DO

## 2024-08-31 ENCOUNTER — Ambulatory Visit: Admitting: Family Medicine

## 2024-08-31 ENCOUNTER — Other Ambulatory Visit: Payer: Self-pay

## 2024-08-31 VITALS — BP 118/80 | HR 93 | Ht 63.0 in | Wt 153.0 lb

## 2024-08-31 DIAGNOSIS — S8002XD Contusion of left knee, subsequent encounter: Secondary | ICD-10-CM | POA: Diagnosis not present

## 2024-08-31 DIAGNOSIS — M79645 Pain in left finger(s): Secondary | ICD-10-CM

## 2024-08-31 NOTE — Patient Instructions (Signed)
 Good to see you Slowly getting better Heat and massage Keep pushing ROM See me in 4-6 months

## 2024-08-31 NOTE — Assessment & Plan Note (Signed)
 Continued improvement noted at this time.  Discussed with patient though that this may take some more time.  Patient has been able to make some progress and we discussed continuing to work on some of the range of motion.  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  Follow-up again in 3 to 4 months

## 2024-12-01 ENCOUNTER — Encounter: Admitting: Family Medicine

## 2025-01-13 ENCOUNTER — Ambulatory Visit

## 2025-01-29 ENCOUNTER — Ambulatory Visit: Admitting: Family Medicine

## 2025-05-14 ENCOUNTER — Encounter: Admitting: Internal Medicine
